# Patient Record
Sex: Female | Born: 1970
Health system: Southern US, Community
[De-identification: ages and names within clinical notes are randomized; demographics above are authoritative.]

## PROBLEM LIST (undated history)

## (undated) ENCOUNTER — Emergency Department (HOSPITAL_COMMUNITY): Admission: EM | Payer: 59

## (undated) DIAGNOSIS — D649 Anemia, unspecified: Secondary | ICD-10-CM

## (undated) DIAGNOSIS — R112 Nausea with vomiting, unspecified: Secondary | ICD-10-CM

## (undated) DIAGNOSIS — R7303 Prediabetes: Secondary | ICD-10-CM

## (undated) DIAGNOSIS — R51 Headache: Secondary | ICD-10-CM

## (undated) DIAGNOSIS — E739 Lactose intolerance, unspecified: Secondary | ICD-10-CM

## (undated) DIAGNOSIS — T884XXA Failed or difficult intubation, initial encounter: Secondary | ICD-10-CM

## (undated) DIAGNOSIS — Z9889 Other specified postprocedural states: Secondary | ICD-10-CM

## (undated) DIAGNOSIS — I10 Essential (primary) hypertension: Secondary | ICD-10-CM

## (undated) HISTORY — DX: Lactose intolerance, unspecified: E73.9

## (undated) HISTORY — DX: Prediabetes: R73.03

## (undated) HISTORY — DX: Failed or difficult intubation, initial encounter: T88.4XXA

## (undated) HISTORY — DX: Essential (primary) hypertension: I10

---

## 1998-11-19 HISTORY — PX: DILATION AND CURETTAGE OF UTERUS: SHX78

## 2009-09-28 ENCOUNTER — Emergency Department (HOSPITAL_COMMUNITY): Admission: EM | Admit: 2009-09-28 | Discharge: 2009-09-28 | Payer: Self-pay | Admitting: Family Medicine

## 2011-01-25 ENCOUNTER — Other Ambulatory Visit: Payer: Self-pay | Admitting: Internal Medicine

## 2011-01-25 DIAGNOSIS — R1900 Intra-abdominal and pelvic swelling, mass and lump, unspecified site: Secondary | ICD-10-CM

## 2011-02-02 ENCOUNTER — Ambulatory Visit
Admission: RE | Admit: 2011-02-02 | Discharge: 2011-02-02 | Disposition: A | Discharge status: Home / Self Care | Payer: 59 | Source: Ambulatory Visit | Attending: Internal Medicine | Admitting: Internal Medicine

## 2011-02-02 DIAGNOSIS — R1900 Intra-abdominal and pelvic swelling, mass and lump, unspecified site: Secondary | ICD-10-CM

## 2011-02-02 MED ORDER — IOHEXOL 300 MG/ML  SOLN
125.0000 mL | Freq: Once | INTRAMUSCULAR | Status: AC | PRN
  Administered 2011-02-02: 125 mL via INTRAVENOUS

## 2011-04-30 ENCOUNTER — Encounter (INDEPENDENT_AMBULATORY_CARE_PROVIDER_SITE_OTHER): Payer: Self-pay | Admitting: Surgery

## 2011-05-01 ENCOUNTER — Encounter (INDEPENDENT_AMBULATORY_CARE_PROVIDER_SITE_OTHER): Payer: Self-pay | Admitting: Surgery

## 2011-05-21 ENCOUNTER — Other Ambulatory Visit: Payer: Self-pay | Admitting: Occupational Medicine

## 2011-05-21 ENCOUNTER — Ambulatory Visit: Payer: Self-pay

## 2011-05-21 DIAGNOSIS — R52 Pain, unspecified: Secondary | ICD-10-CM

## 2012-09-16 ENCOUNTER — Emergency Department (INDEPENDENT_AMBULATORY_CARE_PROVIDER_SITE_OTHER)
Admission: EM | Admit: 2012-09-16 | Discharge: 2012-09-16 | Disposition: A | Discharge status: Home / Self Care | Payer: 59 | Source: Home / Self Care | Attending: Family Medicine | Admitting: Family Medicine

## 2012-09-16 ENCOUNTER — Encounter (HOSPITAL_COMMUNITY): Payer: Self-pay | Admitting: *Deleted

## 2012-09-16 DIAGNOSIS — N946 Dysmenorrhea, unspecified: Secondary | ICD-10-CM

## 2012-09-16 LAB — POCT URINALYSIS DIP (DEVICE)
Bilirubin Urine: NEGATIVE
Ketones, ur: NEGATIVE mg/dL
Leukocytes, UA: NEGATIVE
pH: 6.5 (ref 5.0–8.0)

## 2012-09-16 MED ORDER — KETOROLAC TROMETHAMINE 60 MG/2ML IM SOLN
INTRAMUSCULAR | Status: AC
  Filled 2012-09-16: qty 2

## 2012-09-16 MED ORDER — KETOROLAC TROMETHAMINE 10 MG PO TABS: 10.0000 mg | ORAL_TABLET | Freq: Four times a day (QID) | ORAL | Status: DC | PRN

## 2012-09-16 MED ORDER — KETOROLAC TROMETHAMINE 30 MG/ML IJ SOLN
60.0000 mg | Freq: Once | INTRAMUSCULAR | Status: AC
  Administered 2012-09-16: 60 mg via INTRAMUSCULAR

## 2012-09-16 NOTE — ED Provider Notes (Signed)
History     CSN: [id number]  Arrival date & time 09/16/12  0920   First MD Initiated Contact with Patient 09/16/12 [phone]      Chief Complaint  Patient presents with  . Abdominal Pain    (Consider location/radiation/quality/duration/timing/severity/associated sxs/prior treatment) Patient is a 41 y.o. female presenting with cramps. The history is provided by the patient.  Abdominal Cramping The primary symptoms of the illness include vaginal bleeding. The primary symptoms of the illness do not include fever, nausea, vomiting or vaginal discharge. The current episode started yesterday (assoc with onset of menses on sun, has h/o similar problem with menses in past.). The onset of the illness was sudden. The problem has not changed since onset. The patient states that she believes she is currently not pregnant. The patient has not had a change in bowel habit. Additional symptoms associated with the illness include chills and back pain. Symptoms associated with the illness do not include constipation, urgency, hematuria or frequency.    History reviewed. No pertinent past medical history.  History reviewed. No pertinent past surgical history.  No family history on file.  History  Substance Use Topics  . Smoking status: Never Smoker   . Smokeless tobacco: Not on file  . Alcohol Use: No    OB History    Grav Para Term Preterm Abortions TAB SAB Ect Mult Living                  Review of Systems  Constitutional: Positive for chills. Negative for fever.  Gastrointestinal: Negative.  Negative for nausea, vomiting and constipation.  Genitourinary: Positive for vaginal bleeding, menstrual problem and pelvic pain. Negative for urgency, frequency, hematuria, flank pain, vaginal discharge and vaginal pain.  Musculoskeletal: Positive for back pain.    Allergies  Review of patient's allergies indicates no known allergies.  Home Medications   Current Outpatient Rx  Name Route Sig  Dispense Refill  . KETOROLAC TROMETHAMINE 10 MG PO TABS Oral Take 1 tablet (10 mg total) by mouth every 6 (six) hours as needed for pain. 20 tablet 0    BP 152/82  Pulse 84  Temp 99.4 F (37.4 C) (Oral)  Resp 20  SpO2 100%  LMP 09/15/2012  Physical Exam  Nursing note and vitals reviewed. Constitutional: She is oriented to person, place, and time. She appears well-developed and well-nourished.  HENT:  Head: Normocephalic.  Eyes: Pupils are equal, round, and reactive to light.  Neck: Normal range of motion. Neck supple.  Pulmonary/Chest: Breath sounds normal.  Abdominal: Soft. Bowel sounds are normal. She exhibits no distension and no mass. There is tenderness in the suprapubic area. There is no rebound and no guarding.    Neurological: She is alert and oriented to person, place, and time.  Skin: Skin is warm and dry.    ED Course  Procedures (including critical care time)  Labs Reviewed  POCT URINALYSIS DIP (DEVICE) - Abnormal; Notable for the following:    Hgb urine dipstick LARGE (*)     Protein, ur 30 (*)     All other components within normal limits  POCT PREGNANCY, URINE   No results found.   1. Dysmenorrhea       MDM         Linna Hoff, MD 09/16/12 1037

## 2012-09-16 NOTE — ED Notes (Signed)
Pt  Reports symptoms  Of  Low  abd  Pain   Cramping  In nature  - intermittant   denys  Any  Vag  Bleeding   Or  Discharge  -  Pt  Reports        The  Pain  Is  Sometimes    Associated  With  Her  Periods          Pt  Reports  The  Pain  Started  Yesterday

## 2013-01-21 ENCOUNTER — Encounter (HOSPITAL_COMMUNITY): Payer: Self-pay | Admitting: Pharmacist

## 2013-01-26 NOTE — H&P (Signed)
[facility]  DICTATION # [phone] CSN# [id number]   Meriel Pica, MD 01/26/2013 3:06 PM

## 2013-01-30 ENCOUNTER — Encounter (HOSPITAL_COMMUNITY)
Admission: RE | Admit: 2013-01-30 | Discharge: 2013-01-30 | Disposition: A | Discharge status: Home / Self Care | Payer: 59 | Source: Ambulatory Visit | Attending: Obstetrics and Gynecology | Admitting: Obstetrics and Gynecology

## 2013-01-30 ENCOUNTER — Encounter (HOSPITAL_COMMUNITY): Payer: Self-pay

## 2013-01-30 HISTORY — DX: Anemia, unspecified: D64.9

## 2013-01-30 HISTORY — DX: Other specified postprocedural states: R11.2

## 2013-01-30 HISTORY — DX: Nausea with vomiting, unspecified: Z98.890

## 2013-01-30 HISTORY — DX: Headache: R51

## 2013-01-30 LAB — SURGICAL PCR SCREEN: Staphylococcus aureus: NEGATIVE

## 2013-01-30 LAB — CBC
HCT: 29.1 % — ABNORMAL LOW (ref 36.0–46.0)
Platelets: 390 10*3/uL (ref 150–400)
RDW: 16.9 % — ABNORMAL HIGH (ref 11.5–15.5)
WBC: 6.1 10*3/uL (ref 4.0–10.5)

## 2013-01-30 NOTE — Patient Instructions (Addendum)
   Your procedure is scheduled ZO:[id number] March 20th  Enter through the Main Entrance of [facility] at:6am Pick up the phone at the desk and dial [phone] and inform us of your arrival.  Please call this number if you have any problems the morning of surgery: [phone]  Remember: Do not eat or drink anything after midnight on Wednesday   Do not wear jewelry, make-up, or FINGER nail polish No metal in your hair or on your body. Do not wear lotions, powders, perfumes. You may wear deodorant.  Please use your CHG wash as directed prior to surgery.  Do not shave anywhere for at least 12 hours prior to first CHG shower.  Do not bring valuables to the hospital. Contacts, dentures or bridgework may not be worn into surgery.   Patients discharged on the day of surgery will not be allowed to drive home.

## 2013-02-05 ENCOUNTER — Ambulatory Visit (HOSPITAL_COMMUNITY): Payer: 59 | Admitting: Anesthesiology

## 2013-02-05 ENCOUNTER — Encounter (HOSPITAL_COMMUNITY)
Admission: RE | Disposition: A | Discharge status: Home / Self Care | Payer: Self-pay | Source: Ambulatory Visit | Attending: Obstetrics and Gynecology

## 2013-02-05 ENCOUNTER — Ambulatory Visit (HOSPITAL_COMMUNITY)
Admission: RE | Admit: 2013-02-05 | Discharge: 2013-02-05 | Disposition: A | Discharge status: Home / Self Care | Payer: 59 | Source: Ambulatory Visit | Attending: Obstetrics and Gynecology | Admitting: Obstetrics and Gynecology

## 2013-02-05 ENCOUNTER — Encounter (HOSPITAL_COMMUNITY): Payer: Self-pay | Admitting: Anesthesiology

## 2013-02-05 DIAGNOSIS — N946 Dysmenorrhea, unspecified: Secondary | ICD-10-CM | POA: Insufficient documentation

## 2013-02-05 DIAGNOSIS — N949 Unspecified condition associated with female genital organs and menstrual cycle: Secondary | ICD-10-CM | POA: Insufficient documentation

## 2013-02-05 DIAGNOSIS — N938 Other specified abnormal uterine and vaginal bleeding: Secondary | ICD-10-CM | POA: Insufficient documentation

## 2013-02-05 DIAGNOSIS — D649 Anemia, unspecified: Secondary | ICD-10-CM | POA: Insufficient documentation

## 2013-02-05 HISTORY — PX: LAPAROSCOPIC TUBAL LIGATION: SHX1937

## 2013-02-05 HISTORY — PX: DILITATION & CURRETTAGE/HYSTROSCOPY WITH NOVASURE ABLATION: SHX5568

## 2013-02-05 LAB — TYPE AND SCREEN
ABO/RH(D): O POS
Antibody Screen: NEGATIVE

## 2013-02-05 LAB — PREGNANCY, URINE: Preg Test, Ur: NEGATIVE

## 2013-02-05 SURGERY — DILATATION & CURETTAGE/HYSTEROSCOPY WITH NOVASURE ABLATION
Anesthesia: General | Site: Uterus | Wound class: Clean Contaminated

## 2013-02-05 MED ORDER — ONDANSETRON HCL 4 MG/2ML IJ SOLN
INTRAMUSCULAR | Status: AC
  Filled 2013-02-05: qty 2

## 2013-02-05 MED ORDER — BUPIVACAINE HCL (PF) 0.25 % IJ SOLN
INTRAMUSCULAR | Status: AC
  Filled 2013-02-05: qty 30

## 2013-02-05 MED ORDER — SUCCINYLCHOLINE CHLORIDE 20 MG/ML IJ SOLN
INTRAMUSCULAR | Status: DC | PRN
  Administered 2013-02-05: 120 mg via INTRAVENOUS

## 2013-02-05 MED ORDER — CEFAZOLIN SODIUM-DEXTROSE 2-3 GM-% IV SOLR
2.0000 g | INTRAVENOUS | Status: AC
  Administered 2013-02-05: 2 g via INTRAVENOUS
  Filled 2013-02-05: qty 50

## 2013-02-05 MED ORDER — FENTANYL CITRATE 0.05 MG/ML IJ SOLN
INTRAMUSCULAR | Status: DC | PRN
  Administered 2013-02-05: 50 ug via INTRAVENOUS
  Administered 2013-02-05: 100 ug via INTRAVENOUS
  Administered 2013-02-05 (×2): 50 ug via INTRAVENOUS

## 2013-02-05 MED ORDER — PROPOFOL 10 MG/ML IV EMUL
INTRAVENOUS | Status: AC
  Filled 2013-02-05: qty 20

## 2013-02-05 MED ORDER — MIDAZOLAM HCL 2 MG/2ML IJ SOLN
INTRAMUSCULAR | Status: AC
  Filled 2013-02-05: qty 2

## 2013-02-05 MED ORDER — ROCURONIUM BROMIDE 50 MG/5ML IV SOLN
INTRAVENOUS | Status: AC
  Filled 2013-02-05: qty 1

## 2013-02-05 MED ORDER — LIDOCAINE HCL (CARDIAC) 20 MG/ML IV SOLN
INTRAVENOUS | Status: AC
  Filled 2013-02-05: qty 5

## 2013-02-05 MED ORDER — MIDAZOLAM HCL 5 MG/5ML IJ SOLN
INTRAMUSCULAR | Status: DC | PRN
  Administered 2013-02-05: 2 mg via INTRAVENOUS

## 2013-02-05 MED ORDER — DEXAMETHASONE SODIUM PHOSPHATE 10 MG/ML IJ SOLN
INTRAMUSCULAR | Status: AC
  Filled 2013-02-05: qty 1

## 2013-02-05 MED ORDER — KETOROLAC TROMETHAMINE 30 MG/ML IJ SOLN: 15.0000 mg | Freq: Once | INTRAMUSCULAR | Status: DC | PRN

## 2013-02-05 MED ORDER — SCOPOLAMINE 1 MG/3DAYS TD PT72: 1.0000 | MEDICATED_PATCH | Freq: Once | TRANSDERMAL | Status: DC

## 2013-02-05 MED ORDER — SODIUM CHLORIDE 0.9 % IJ SOLN
INTRAMUSCULAR | Status: DC | PRN
  Administered 2013-02-05: 10 mL

## 2013-02-05 MED ORDER — ROCURONIUM BROMIDE 100 MG/10ML IV SOLN
INTRAVENOUS | Status: DC | PRN
  Administered 2013-02-05: 30 mg via INTRAVENOUS
  Administered 2013-02-05: 5 mg via INTRAVENOUS

## 2013-02-05 MED ORDER — FENTANYL CITRATE 0.05 MG/ML IJ SOLN
INTRAMUSCULAR | Status: AC
  Filled 2013-02-05: qty 2

## 2013-02-05 MED ORDER — ONDANSETRON HCL 4 MG/2ML IJ SOLN: 4.0000 mg | Freq: Once | INTRAMUSCULAR | Status: DC | PRN

## 2013-02-05 MED ORDER — LIDOCAINE HCL (CARDIAC) 20 MG/ML IV SOLN
INTRAVENOUS | Status: DC | PRN
  Administered 2013-02-05: 50 mg via INTRAVENOUS

## 2013-02-05 MED ORDER — SCOPOLAMINE 1 MG/3DAYS TD PT72
MEDICATED_PATCH | TRANSDERMAL | Status: AC
  Administered 2013-02-05: 1.5 mg via TRANSDERMAL
  Filled 2013-02-05: qty 1

## 2013-02-05 MED ORDER — HYDROCODONE-IBUPROFEN 7.5-200 MG PO TABS: 1.0000 | ORAL_TABLET | Freq: Three times a day (TID) | ORAL | Status: DC | PRN

## 2013-02-05 MED ORDER — KETOROLAC TROMETHAMINE 30 MG/ML IJ SOLN
INTRAMUSCULAR | Status: DC | PRN
  Administered 2013-02-05: 30 mg via INTRAVENOUS

## 2013-02-05 MED ORDER — FENTANYL CITRATE 0.05 MG/ML IJ SOLN
INTRAMUSCULAR | Status: AC
  Filled 2013-02-05: qty 5

## 2013-02-05 MED ORDER — LIDOCAINE HCL 1 % IJ SOLN
INTRAMUSCULAR | Status: DC | PRN
  Administered 2013-02-05: 20 mL

## 2013-02-05 MED ORDER — BUPIVACAINE HCL (PF) 0.25 % IJ SOLN
INTRAMUSCULAR | Status: DC | PRN
  Administered 2013-02-05: 4 mL

## 2013-02-05 MED ORDER — MEPERIDINE HCL 25 MG/ML IJ SOLN: 6.2500 mg | INTRAMUSCULAR | Status: DC | PRN

## 2013-02-05 MED ORDER — NEOSTIGMINE METHYLSULFATE 1 MG/ML IJ SOLN
INTRAMUSCULAR | Status: AC
  Filled 2013-02-05: qty 1

## 2013-02-05 MED ORDER — SUCCINYLCHOLINE CHLORIDE 20 MG/ML IJ SOLN
INTRAMUSCULAR | Status: AC
  Filled 2013-02-05: qty 10

## 2013-02-05 MED ORDER — LACTATED RINGERS IV SOLN
INTRAVENOUS | Status: DC
  Administered 2013-02-05: 07:00:00 via INTRAVENOUS

## 2013-02-05 MED ORDER — KETOROLAC TROMETHAMINE 30 MG/ML IJ SOLN
INTRAMUSCULAR | Status: AC
  Filled 2013-02-05: qty 1

## 2013-02-05 MED ORDER — FENTANYL CITRATE 0.05 MG/ML IJ SOLN
25.0000 ug | INTRAMUSCULAR | Status: DC | PRN
  Administered 2013-02-05: 50 ug via INTRAVENOUS
  Administered 2013-02-05: 25 ug via INTRAVENOUS

## 2013-02-05 MED ORDER — ONDANSETRON HCL 4 MG/2ML IJ SOLN
INTRAMUSCULAR | Status: DC | PRN
  Administered 2013-02-05: 4 mg via INTRAVENOUS

## 2013-02-05 MED ORDER — LACTATED RINGERS IR SOLN
Status: DC | PRN
  Administered 2013-02-05: 3000 mL

## 2013-02-05 MED ORDER — GLYCOPYRROLATE 0.2 MG/ML IJ SOLN
INTRAMUSCULAR | Status: AC
  Filled 2013-02-05: qty 4

## 2013-02-05 MED ORDER — PROPOFOL 10 MG/ML IV EMUL
INTRAVENOUS | Status: DC | PRN
  Administered 2013-02-05: 200 mg via INTRAVENOUS
  Administered 2013-02-05: 100 mg via INTRAVENOUS

## 2013-02-05 MED ORDER — DEXAMETHASONE SODIUM PHOSPHATE 4 MG/ML IJ SOLN
INTRAMUSCULAR | Status: DC | PRN
  Administered 2013-02-05: 10 mg via INTRAVENOUS

## 2013-02-05 SURGICAL SUPPLY — 25 items
ABLATOR ENDOMETRIAL BIPOLAR (ABLATOR) ×3 IMPLANT
CATH ROBINSON RED A/P 16FR (CATHETERS) ×3 IMPLANT
CLIP FILSHIE TUBAL LIGA STRL (Clip) ×6 IMPLANT
CLOTH BEACON ORANGE TIMEOUT ST (SAFETY) ×3 IMPLANT
CONTAINER PREFILL 10% NBF 60ML (FORM) ×6 IMPLANT
DERMABOND ADVANCED (GAUZE/BANDAGES/DRESSINGS) ×1
DERMABOND ADVANCED .7 DNX12 (GAUZE/BANDAGES/DRESSINGS) ×2 IMPLANT
DRESSING TELFA 8X3 (GAUZE/BANDAGES/DRESSINGS) ×3 IMPLANT
DRSG COVADERM PLUS 2X2 (GAUZE/BANDAGES/DRESSINGS) ×3 IMPLANT
GLOVE BIO SURGEON STRL SZ7 (GLOVE) ×3 IMPLANT
GOWN PREVENTION PLUS LG XLONG (DISPOSABLE) ×6 IMPLANT
GOWN STRL REIN XL XLG (GOWN DISPOSABLE) ×9 IMPLANT
NEEDLE INSUFFLATION 120MM (ENDOMECHANICALS) ×3 IMPLANT
NEEDLE INSUFFLATION 14GA 150MM (NEEDLE) ×3 IMPLANT
PACK HYSTEROSCOPY LF (CUSTOM PROCEDURE TRAY) ×3 IMPLANT
PACK LAPAROSCOPY BASIN (CUSTOM PROCEDURE TRAY) ×3 IMPLANT
PAD OB MATERNITY 4.3X12.25 (PERSONAL CARE ITEMS) ×3 IMPLANT
STRIP CLOSURE SKIN 1/4X3 (GAUZE/BANDAGES/DRESSINGS) ×3 IMPLANT
SUT VIC AB 2-0 UR6 27 (SUTURE) IMPLANT
SUT VICRYL RAPIDE 4/0 PS 2 (SUTURE) ×3 IMPLANT
SYR 20CC LL (SYRINGE) ×3 IMPLANT
TOWEL OR 17X24 6PK STRL BLUE (TOWEL DISPOSABLE) ×6 IMPLANT
TROCAR XCEL DIL TIP R 11M (ENDOMECHANICALS) ×3 IMPLANT
WARMER LAPAROSCOPE (MISCELLANEOUS) ×3 IMPLANT
WATER STERILE IRR 1000ML POUR (IV SOLUTION) ×3 IMPLANT

## 2013-02-05 NOTE — Anesthesia Preprocedure Evaluation (Signed)
Anesthesia Evaluation  Patient identified by MRN, date of birth, ID band Patient awake    Reviewed: Allergy & Precautions, H&P , NPO status , Patient's Chart, lab work & pertinent test results  History of Anesthesia Complications (+) PONV  Airway Mallampati: III TM Distance: >3 FB Neck ROM: full    Dental no notable dental hx. (+) Teeth Intact   Pulmonary neg pulmonary ROS,    Pulmonary exam normal       Cardiovascular negative cardio ROS      Neuro/Psych negative psych ROS   GI/Hepatic negative GI ROS, Neg liver ROS,   Endo/Other  Morbid obesity  Renal/GU negative Renal ROS  negative genitourinary   Musculoskeletal negative musculoskeletal ROS (+)   Abdominal (+) + obese,   Peds negative pediatric ROS (+)  Hematology negative hematology ROS (+)   Anesthesia Other Findings   Reproductive/Obstetrics negative OB ROS                           Anesthesia Physical Anesthesia Plan  ASA: III  Anesthesia Plan: General   Post-op Pain Management:    Induction: Intravenous  Airway Management Planned: Oral ETT  Additional Equipment:   Intra-op Plan:   Post-operative Plan: Extubation in OR  Informed Consent: I have reviewed the patients History and Physical, chart, labs and discussed the procedure including the risks, benefits and alternatives for the proposed anesthesia with the patient or authorized representative who has indicated his/her understanding and acceptance.   Dental Advisory Given  Plan Discussed with: CRNA and Surgeon  Anesthesia Plan Comments:         Anesthesia Quick Evaluation

## 2013-02-05 NOTE — Procedures (Signed)
NAMEJOLEAH, KOSAK NO.:  [id number]  MEDICAL RECORD NO.:  [id number]          PATIENT TYPE:  OUT  LOCATION:  SLEEP LAB                     FACILITY:  [facility]  PHYSICIAN:  Duke Salvia. Marcelle Overlie, M.D.DATE OF BIRTH:  11-20-1970  DATE OF STUDY:                           NOCTURNAL POLYSOMNOGRAM  REFERRING PHYSICIAN:  CHIEF COMPLAINT:  Menorrhagia with anemia, requests permanent sterilization.  HISTORY OF PRESENT ILLNESS:  A 42 year old, G2, P2, using condoms seen initially 1/14 complaining of menorrhagia, history of 2 vaginal deliveries in the past.  Our office fingerstick hemoglobin was 4.5, with UPT negative.  She was started on OCPs b.i.d. at that time, the controller bleeding along with iron and sonohysterogram was ordered. Her CBC showed a WBC 6.7, hemoglobin 5.4, platelets 344,000.  Beta-HCG in our office showed thickened endometrium, 1 cm on each side, on saline infusion, was noted to be approximately 10 mm endometrium on 1 side and then there was also a small possible polyp in the fundus.  We discussed a number of treatment options with her.  In addition to being on her iron and OCPs to regulate her bleeding including Mirena IUD, D and C, hysteroscopy, ablation, or hysterectomy.  The need for minimally doing endometrial sampling discussed with her.  She presents now preferring lap tubal with D and C, and NovaSure endometrial ablation. The permanence of this procedure, failure rated 2 to 01/999 discussed. The ablation procedure including the 90% hypermenorrhea rate.  Other risks related to bleeding, infection, complications, may require additional surgery such as uterine perforation.  Reviewed with her.  INDICATION FOR STUDY:  EPWORTH SLEEPINESS SCORE:  MEDICATIONS:  OCPs for bleeding regulation, Ferrlecit 90, ketorolac 10 mg 1 p.o. q.6 hours p.r.n. cramps along with multivitamins.  REVIEW OF SYSTEMS:  Otherwise, negative.  FAMILY HISTORY:  Otherwise,  unremarkable.  She has had 2 vaginal deliveries in 1998 and 2001.  SOCIAL HISTORY:  Denies drug or tobacco use.  Occasional alcohol use. She is married.  PHYSICAL EXAMINATION:  VITAL SIGNS: Temperature 98.2, BP 116/60.  Her weight is 248. HEENT:  Unremarkable. NECK:  Supple without masses.  LUNGS:  Clear. CARDIOVASCULAR:  Regular rate and rhythm without murmurs, rubs, gallops. BREASTS:  Without masses. ABDOMEN:  Soft, flat, and nontender.  PELVIC:  Vulva, vagina, cervix were normal.  Uterus is mid position, normal size.  Adnexa negative. Last Pap was in June 2013. EXTREMITIES: Unremarkable. NEUROLOGIC:  Unremarkable.  IMPRESSION:  Menorrhagia with dysmenorrhea and anemia.  PLAN:  D and C, hysteroscopy with NovaSure endometrial ablation, laparoscopic tubal with Filshie clip application.  Procedure and risks discussed as above.  SLEEP ARCHITECTURE:  RESPIRATORY DATA:  OXYGEN DATA:  CARDIAC DATA:  MOVEMENT-PARASOMNIA:  IMPRESSIONS-RECOMMENDATIONS:     Duke Salvia. Marcelle Overlie, M.D.    [facility]/MEDQ  D:  01/26/2013 15:01:25  T:  01/27/2013 04:01:07  Job:  [id number]

## 2013-02-05 NOTE — Progress Notes (Signed)
The patient was re-examined with no change in status 

## 2013-02-05 NOTE — Anesthesia Postprocedure Evaluation (Signed)
  Anesthesia Post-op Note  Anesthesia Post Note  Patient: [name]  Procedure(s) Performed: Procedure(s) (LRB): DILATATION & CURETTAGE/HYSTEROSCOPY WITH NOVASURE ABLATION (N/A) LAPAROSCOPIC TUBAL LIGATION (Bilateral)  Anesthesia type: General  Patient location: PACU  Post pain: Pain level controlled  Post assessment: Post-op Vital signs reviewed  Last Vitals:  Filed Vitals:   02/05/13 0930  BP: 138/78  Pulse: 59  Temp:   Resp: 14    Post vital signs: Reviewed  Level of consciousness: sedated  Complications: No apparent anesthesia complications

## 2013-02-05 NOTE — Op Note (Signed)
preoperative diagnosis: Request permanent sterilization, abnormal uterine bleeding with anemia  Postoperative diagnosis: Same  Procedure: Attempted laparoscopic tubal ligation, D&C hysteroscopy with NovaSure endometrial ablation  Surgeon: Marcelle Overlie  Anesthesia: General  EBL: Less than 10 cc  Specimens removed: Endometrial curettings, to pathology  Procedure and findings:  The patient taken the operating room after an adequate level of general anesthesia was obtained with the patient's legs in stirrups the abdomen perineum and vagina were prepped and draped in usual fashion for laparoscopy/D&C. The bladder was drained. Appropriate timeout for taken at that point. Hulka tenaculum was positioned on the uterus.  Anesthesia had some difficulty with intubation, therefore NG tube was passed to insufflate the stomach she had a large panniculus, even with the longer varies needle could never achieve normal intra-abdominal type pressure readings to be able to insufflate properly. Decision made to abandon further attempts at laparoscopy. The 4-0 Vicryl suture used to close the incision in subcuticular fashion. The vaginal portion the procedure was started  The legs were extended weighted speculum was positioned cervix grasped with tenaculum uterus sounded to 12 cm with a cervical length of 4.5. The cervix was progressively dilated to 27-32 dilator hysteroscopy was carried out revealing the endometrium to be fairly thin I did not see any polyps or abnormal tissue buildup. Sharp curettage was carried out minimal tissue removed sent as specimen to pathology.  After the appropriate measurements were entered into the NovaSure device it was placed into the uterus per protocol passing the CO2 testing and completing the treatment cycle. She received Toradol  At the end of the procedure went to recovery room in good condition.  Dictated with [facility]  Marykathleen Russi M. Milana Obey.D.

## 2013-02-05 NOTE — Transfer of Care (Signed)
Immediate Anesthesia Transfer of Care Note  Patient: [name]  Procedure(s) Performed: Procedure(s) with comments: DILATATION & CURETTAGE/HYSTEROSCOPY WITH NOVASURE ABLATION (N/A) LAPAROSCOPIC TUBAL LIGATION (Bilateral) - attempted tubal ligation  Patient Location: PACU  Anesthesia Type:General  Level of Consciousness: awake, alert  and oriented  Airway & Oxygen Therapy: Patient Spontanous Breathing and Patient connected to nasal cannula oxygen  Post-op Assessment: Report given to PACU RN and Post -op Vital signs reviewed and stable  Post vital signs: Reviewed and stable  Complications: No apparent anesthesia complications

## 2013-02-05 NOTE — Anesthesia Procedure Notes (Signed)
Procedure Name: Intubation Date/Time: 02/05/2013 7:40 AM Performed by: Shanon Payor Pre-anesthesia Checklist: Suction available, Emergency Drugs available, Timeout performed, Patient identified and Patient being monitored Patient Re-evaluated:Patient Re-evaluated prior to inductionOxygen Delivery Method: Circle system utilized Preoxygenation: Pre-oxygenation with 100% oxygen Intubation Type: IV induction and Inhalational induction Ventilation: Oral airway inserted - appropriate to patient size and Two handed mask ventilation required Grade View: Grade III Tube type: Oral Tube size: 7.0 mm Number of attempts: 3 Airway Equipment and Method: Stylet and Video-laryngoscopy Placement Confirmation: ETT inserted through vocal cords under direct vision,  positive ETCO2 and breath sounds checked- equal and bilateral Secured at: 22 cm Dental Injury: Teeth and Oropharynx as per pre-operative assessment  Difficulty Due To: Difficulty was anticipated, Difficult Airway- due to large tongue and Difficult Airway- due to limited oral opening

## 2013-02-05 NOTE — H&P (Signed)
[name], [name] NO.:  [id number]  MEDICAL RECORD NO.:  [id number]  LOCATION:  [facility]                          FACILITY:  [facility]  PHYSICIAN:  Duke Salvia. Marcelle Overlie, M.D.DATE OF BIRTH:  May 22, 1971  DATE OF ADMISSION:  02/05/2013 DATE OF DISCHARGE:  02/05/2013                             HISTORY & PHYSICAL   CHIEF COMPLAINT:  Menorrhagia with anemia, requests permanent sterilization.  HISTORY OF PRESENT ILLNESS:  A 42 year old, G2, P2, using condoms, seen initially 1/14 complaining of menorrhagia.  History of 2 vaginal deliveries in the past.  Our office fingerstick hemoglobin was 4.5, with UPT negative.  She was started on OCPs b.i.d. at that time, the controller bleeding along with iron and sonohysterogram was ordered. Her CBC showed a WBC 6.7, hemoglobin 5.4, platelets 344,000.  Beta-HCG in our office showed thickened endometrium, 1 cm on each side, on saline infusion, was noted to be approximately 10 mm endometrium on 1 side and then there was also a small possible polyp in the fundus.  We discussed a number of treatment options with her.  In addition to being on her iron and OCPs to regulate her bleeding including Mirena IUD, D and C, hysteroscopy, ablation, or hysterectomy.  The need for minimally doing endometrial sampling discussed with her.  She presents now preferring lap tubal with D and C, and NovaSure endometrial ablation. The permanence of this procedure, failure rated 2 to 01/999 discussed. The ablation procedure including the 90% hypermenorrhea rate.  Other risks related to bleeding, infection, complications, may require additional surgery such as uterine perforation.  Reviewed with her.  CURRENT MEDICATIONS:  OCPs for bleeding regulation, Ferrlecit 90, ketorolac 10 mg 1 p.o. q.6 hours p.r.n. cramps along with multivitamins.  REVIEW OF SYSTEMS:  Otherwise, negative.  FAMILY HISTORY:  Otherwise, unremarkable.  She has had 2 vaginal deliveries in  1998 and 2001.  SOCIAL HISTORY:  Denies drug or tobacco use.  Occasional alcohol use. She is married.  PHYSICAL EXAMINATION:  VITAL SIGNS: Temperature 98.2, BP 116/60.  Her weight is 248. HEENT:  Unremarkable. NECK:  Supple without masses.  LUNGS:  Clear. CARDIOVASCULAR:  Regular rate and rhythm without murmurs, rubs, gallops. BREASTS:  Without masses. ABDOMEN:  Soft, flat, and nontender. PELVIC:  Vulva, vagina, cervix were normal.  Uterus is mid position, normal size.  Adnexa negative.  Last Pap was in June 2013. EXTREMITIES: Unremarkable. NEUROLOGIC:  Unremarkable.  IMPRESSION:  Menorrhagia with dysmenorrhea and anemia.  PLAN:  D and C, hysteroscopy with NovaSure endometrial ablation, laparoscopic tubal with Filshie clip application.  Procedure and risks discussed as above.     [name] M. Marcelle Overlie, M.D.     [facility]/MEDQ  D:  01/26/2013  T:  02/05/2013  Job:  [id number]

## 2013-02-06 ENCOUNTER — Encounter (HOSPITAL_COMMUNITY): Payer: Self-pay | Admitting: Obstetrics and Gynecology

## 2014-03-24 ENCOUNTER — Encounter (HOSPITAL_COMMUNITY): Payer: Self-pay | Admitting: Obstetrics and Gynecology

## 2014-06-23 ENCOUNTER — Ambulatory Visit (INDEPENDENT_AMBULATORY_CARE_PROVIDER_SITE_OTHER): Payer: Commercial Managed Care - PPO | Admitting: Family Medicine

## 2014-06-23 VITALS — BP 128/72 | HR 83 | Temp 98.1°F | Resp 16 | Ht 63.5 in | Wt 265.0 lb

## 2014-06-23 DIAGNOSIS — Z0000 Encounter for general adult medical examination without abnormal findings: Secondary | ICD-10-CM

## 2014-06-23 DIAGNOSIS — Z862 Personal history of diseases of the blood and blood-forming organs and certain disorders involving the immune mechanism: Secondary | ICD-10-CM

## 2014-06-23 LAB — POCT CBC
Granulocyte percent: 49.6 %G (ref 37–80)
HCT, POC: 40.5 % (ref 37.7–47.9)
Hemoglobin: 13 g/dL (ref 12.2–16.2)
Lymph, poc: 2.2 (ref 0.6–3.4)
MCH: 24.9 pg — AB (ref 27–31.2)
MCHC: 32.2 g/dL (ref 31.8–35.4)
MCV: 77.5 fL — AB (ref 80–97)
MID (CBC): 0.3 (ref 0–0.9)
MPV: 7.1 fL (ref 0–99.8)
PLATELET COUNT, POC: 291 10*3/uL (ref 142–424)
POC Granulocyte: 2.4 (ref 2–6.9)
POC LYMPH %: 44 % (ref 10–50)
POC MID %: 6.4 %M (ref 0–12)
RBC: 5.22 M/uL (ref 4.04–5.48)
RDW, POC: 15.4 %
WBC: 4.9 10*3/uL (ref 4.6–10.2)

## 2014-06-23 LAB — POCT GLYCOSYLATED HEMOGLOBIN (HGB A1C): Hemoglobin A1C: 5.6

## 2014-06-23 NOTE — Progress Notes (Signed)
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 She just felt like it is time to get one. She gets her Pap and breast exam done elsewhere, and is scheduled this fall. She has no major acute medical complaints.  Past medical history: Surgeries: Only one minor surgery Medical illnesses: None except has a history of anemia Gravida 2. Para 2 Allergies: None Regular medications: None except daily OTC iron  Social history: Patient is from [place], has been in the [place] about 8 years. She has been healthy. She is married and has 2 children. She works at [employer]. She does not get any regular exercise.  Review of systems: Constitutional: Unremarkable HEENT: Unremarkable Respiratory: Unremarkable Gastrointestinal: Unremarkable Cardiovascular : Unremarkable Genitourinary: Unremarkable Musculoskeletal: Unremarkable Neurologic: Normal Dermatologic: Unremarkable Psychiatric: Unremarkable Endocrinologic: Unremarkable   Physical exam: Obese lady in no acute distress. TMs normal. Eyes PERRLA. Fundi benign. Throat clear. Neck supple without nodes or. Chest clear to auscultation. Heart regular without murmurs gallops or arrhythmias. Abdomen soft without mass or tenderness. Breasts and pelvic exam not done today. Extremities unremarkable without edema. Good pedal pulses. Skin normal.  Assessment: Physical examination Obesity, morbid History of anemia  Plan: Check labs including level additional labs. She wants to make sure she is not diabetic.  I will let her know the results for labs

## 2014-06-23 NOTE — Patient Instructions (Signed)
Get regular exercise  Decreased oral intake of food.  Try to get her weight coming down  Let you know the results of your other labs  Make sure you can get the Pap smear and breast exam as already scheduled

## 2014-06-24 LAB — COMPREHENSIVE METABOLIC PANEL
ALK PHOS: 63 U/L (ref 39–117)
ALT: 14 U/L (ref 0–35)
AST: 15 U/L (ref 0–37)
Albumin: 4.4 g/dL (ref 3.5–5.2)
BILIRUBIN TOTAL: 0.4 mg/dL (ref 0.2–1.2)
BUN: 9 mg/dL (ref 6–23)
CO2: 30 mEq/L (ref 19–32)
Calcium: 9.4 mg/dL (ref 8.4–10.5)
Chloride: 101 mEq/L (ref 96–112)
Creat: 0.65 mg/dL (ref 0.50–1.10)
GLUCOSE: 85 mg/dL (ref 70–99)
Potassium: 4.1 mEq/L (ref 3.5–5.3)
Sodium: 137 mEq/L (ref 135–145)
Total Protein: 7.2 g/dL (ref 6.0–8.3)

## 2014-06-24 LAB — FERRITIN: Ferritin: 23 ng/mL (ref 10–291)

## 2014-06-24 LAB — LIPID PANEL
Cholesterol: 141 mg/dL (ref 0–200)
HDL: 38 mg/dL — AB (ref >39)
LDL CALC: 84 mg/dL (ref 0–99)
TRIGLYCERIDES: 96 mg/dL (ref <150)
Total CHOL/HDL Ratio: 3.7 Ratio
VLDL: 19 mg/dL (ref 0–40)

## 2014-06-24 LAB — TSH: TSH: 1.562 u[IU]/mL (ref 0.350–4.500)

## 2014-06-26 ENCOUNTER — Encounter: Payer: Self-pay | Admitting: *Deleted

## 2014-08-30 ENCOUNTER — Other Ambulatory Visit: Payer: Self-pay | Admitting: Obstetrics and Gynecology

## 2014-08-31 LAB — CYTOLOGY - PAP

## 2015-10-19 ENCOUNTER — Ambulatory Visit (INDEPENDENT_AMBULATORY_CARE_PROVIDER_SITE_OTHER): Payer: 59 | Admitting: Family Medicine

## 2015-10-19 VITALS — BP 120/72 | HR 85 | Temp 98.9°F | Resp 18 | Ht 63.0 in | Wt 268.0 lb

## 2015-10-19 DIAGNOSIS — D508 Other iron deficiency anemias: Secondary | ICD-10-CM | POA: Diagnosis not present

## 2015-10-19 DIAGNOSIS — Z0000 Encounter for general adult medical examination without abnormal findings: Secondary | ICD-10-CM

## 2015-10-19 LAB — POCT CBC
Granulocyte percent: 52.8 %G (ref 37–80)
HCT, POC: 31 % — AB (ref 37.7–47.9)
Hemoglobin: 10.2 g/dL — AB (ref 12.2–16.2)
Lymph, poc: 2 (ref 0.6–3.4)
MCH, POC: 21.2 pg — AB (ref 27–31.2)
MCHC: 32.8 g/dL (ref 31.8–35.4)
MCV: 64.7 fL — AB (ref 80–97)
MID (cbc): 0.5 (ref 0–0.9)
MPV: 6.5 fL (ref 0–99.8)
POC Granulocyte: 2.9 (ref 2–6.9)
POC LYMPH PERCENT: 37.3 %L (ref 10–50)
POC MID %: 9.9 %M (ref 0–12)
Platelet Count, POC: 326 10*3/uL (ref 142–424)
RBC: 4.79 M/uL (ref 4.04–5.48)
RDW, POC: 18.5 %
WBC: 5.4 10*3/uL (ref 4.6–10.2)

## 2015-10-19 LAB — COMPLETE METABOLIC PANEL WITH GFR
ALT: 11 U/L (ref 6–29)
AST: 13 U/L (ref 10–35)
Albumin: 3.9 g/dL (ref 3.6–5.1)
Alkaline Phosphatase: 54 U/L (ref 33–115)
BUN: 10 mg/dL (ref 7–25)
CO2: 25 mmol/L (ref 20–31)
Calcium: 8.8 mg/dL (ref 8.6–10.2)
Chloride: 103 mmol/L (ref 98–110)
Creat: 0.61 mg/dL (ref 0.50–1.10)
GFR, Est African American: >89 mL/min (ref >=60)
GFR, Est Non African American: >89 mL/min (ref >=60)
Glucose, Bld: 85 mg/dL (ref 65–99)
Potassium: 4.3 mmol/L (ref 3.5–5.3)
Sodium: 137 mmol/L (ref 135–146)
Total Bilirubin: 0.4 mg/dL (ref 0.2–1.2)
Total Protein: 6.8 g/dL (ref 6.1–8.1)

## 2015-10-19 LAB — POCT URINALYSIS DIP (MANUAL ENTRY)
Bilirubin, UA: NEGATIVE
Blood, UA: NEGATIVE
Glucose, UA: NEGATIVE
Ketones, POC UA: NEGATIVE
Leukocytes, UA: NEGATIVE
Nitrite, UA: NEGATIVE
Protein Ur, POC: NEGATIVE
Spec Grav, UA: <=1.005
Urobilinogen, UA: 0.2
pH, UA: 5.5

## 2015-10-19 LAB — LIPID PANEL
Cholesterol: 144 mg/dL (ref 125–200)
HDL: 42 mg/dL — ABNORMAL LOW (ref >=46)
LDL Cholesterol: 91 mg/dL (ref <130)
Total CHOL/HDL Ratio: 3.4 Ratio (ref <=5.0)
Triglycerides: 54 mg/dL (ref <150)
VLDL: 11 mg/dL (ref <30)

## 2015-10-19 LAB — POC MICROSCOPIC URINALYSIS (UMFC): Mucus: ABSENT

## 2015-10-19 LAB — FERRITIN: Ferritin: 6 ng/mL — ABNORMAL LOW (ref 10–291)

## 2015-10-19 NOTE — Patient Instructions (Signed)
Health Maintenance, Female Adopting a healthy lifestyle and getting preventive care can go a long way to promote health and wellness. Talk with your health care provider about what schedule of regular examinations is right for you. This is a good chance for you to check in with your provider about disease prevention and staying healthy. In between checkups, there are plenty of things you can do on your own. Experts have done a lot of research about which lifestyle changes and preventive measures are most likely to keep you healthy. Ask your health care provider for more information. WEIGHT AND DIET  Eat a healthy diet  Be sure to include plenty of vegetables, fruits, low-fat dairy products, and lean protein.  Do not eat a lot of foods high in solid fats, added sugars, or salt.  Get regular exercise. This is one of the most important things you can do for your health.  Most adults should exercise for at least 150 minutes each week. The exercise should increase your heart rate and make you sweat (moderate-intensity exercise).  Most adults should also do strengthening exercises at least twice a week. This is in addition to the moderate-intensity exercise.  Maintain a healthy weight  Body mass index (BMI) is a measurement that can be used to identify possible weight problems. It estimates body fat based on height and weight. Your health care provider can help determine your BMI and help you achieve or maintain a healthy weight.  For females 20 years of age and older:   A BMI below 18.5 is considered underweight.  A BMI of 18.5 to 24.9 is normal.  A BMI of 25 to 29.9 is considered overweight.  A BMI of 30 and above is considered obese.  Watch levels of cholesterol and blood lipids  You should start having your blood tested for lipids and cholesterol at 45 years of age, then have this test every 5 years.  You may need to have your cholesterol levels checked more often if:  Your lipid  or cholesterol levels are high.  You are older than 45 years of age.  You are at high risk for heart disease.  CANCER SCREENING   Lung Cancer  Lung cancer screening is recommended for adults 55-80 years old who are at high risk for lung cancer because of a history of smoking.  A yearly low-dose CT scan of the lungs is recommended for people who:  Currently smoke.  Have quit within the past 15 years.  Have at least a 30-pack-year history of smoking. A pack year is smoking an average of one pack of cigarettes a day for 1 year.  Yearly screening should continue until it has been 15 years since you quit.  Yearly screening should stop if you develop a health problem that would prevent you from having lung cancer treatment.  Breast Cancer  Practice breast self-awareness. This means understanding how your breasts normally appear and feel.  It also means doing regular breast self-exams. Let your health care provider know about any changes, no matter how small.  If you are in your 20s or 30s, you should have a clinical breast exam (CBE) by a health care provider every 1-3 years as part of a regular health exam.  If you are 40 or older, have a CBE every year. Also consider having a breast X-ray (mammogram) every year.  If you have a family history of breast cancer, talk to your health care provider about genetic screening.  If you   are at high risk for breast cancer, talk to your health care provider about having an MRI and a mammogram every year.  Breast cancer gene (BRCA) assessment is recommended for women who have family members with BRCA-related cancers. BRCA-related cancers include:  Breast.  Ovarian.  Tubal.  Peritoneal cancers.  Results of the assessment will determine the need for genetic counseling and BRCA1 and BRCA2 testing. Cervical Cancer Your health care provider may recommend that you be screened regularly for cancer of the pelvic organs (ovaries, uterus, and  vagina). This screening involves a pelvic examination, including checking for microscopic changes to the surface of your cervix (Pap test). You may be encouraged to have this screening done every 3 years, beginning at age 21.  For women ages 30-65, health care providers may recommend pelvic exams and Pap testing every 3 years, or they may recommend the Pap and pelvic exam, combined with testing for human papilloma virus (HPV), every 5 years. Some types of HPV increase your risk of cervical cancer. Testing for HPV may also be done on women of any age with unclear Pap test results.  Other health care providers may not recommend any screening for nonpregnant women who are considered low risk for pelvic cancer and who do not have symptoms. Ask your health care provider if a screening pelvic exam is right for you.  If you have had past treatment for cervical cancer or a condition that could lead to cancer, you need Pap tests and screening for cancer for at least 20 years after your treatment. If Pap tests have been discontinued, your risk factors (such as having a new sexual partner) need to be reassessed to determine if screening should resume. Some women have medical problems that increase the chance of getting cervical cancer. In these cases, your health care provider may recommend more frequent screening and Pap tests. Colorectal Cancer  This type of cancer can be detected and often prevented.  Routine colorectal cancer screening usually begins at 45 years of age and continues through 45 years of age.  Your health care provider may recommend screening at an earlier age if you have risk factors for colon cancer.  Your health care provider may also recommend using home test kits to check for hidden blood in the stool.  A small camera at the end of a tube can be used to examine your colon directly (sigmoidoscopy or colonoscopy). This is done to check for the earliest forms of colorectal  cancer.  Routine screening usually begins at age 50.  Direct examination of the colon should be repeated every 5-10 years through 45 years of age. However, you may need to be screened more often if early forms of precancerous polyps or small growths are found. Skin Cancer  Check your skin from head to toe regularly.  Tell your health care provider about any new moles or changes in moles, especially if there is a change in a mole's shape or color.  Also tell your health care provider if you have a mole that is larger than the size of a pencil eraser.  Always use sunscreen. Apply sunscreen liberally and repeatedly throughout the day.  Protect yourself by wearing long sleeves, pants, a wide-brimmed hat, and sunglasses whenever you are outside. HEART DISEASE, DIABETES, AND HIGH BLOOD PRESSURE   High blood pressure causes heart disease and increases the risk of stroke. High blood pressure is more likely to develop in:  People who have blood pressure in the high end   of the normal range (130-139/85-89 mm Hg).  People who are overweight or obese.  People who are African American.  If you are 38-23 years of age, have your blood pressure checked every 3-5 years. If you are 61 years of age or older, have your blood pressure checked every year. You should have your blood pressure measured twice--once when you are at a hospital or clinic, and once when you are not at a hospital or clinic. Record the average of the two measurements. To check your blood pressure when you are not at a hospital or clinic, you can use:  An automated blood pressure machine at a pharmacy.  A home blood pressure monitor.  If you are between 45 years and 39 years old, ask your health care provider if you should take aspirin to prevent strokes.  Have regular diabetes screenings. This involves taking a blood sample to check your fasting blood sugar level.  If you are at a normal weight and have a low risk for diabetes,  have this test once every three years after 45 years of age.  If you are overweight and have a high risk for diabetes, consider being tested at a younger age or more often. PREVENTING INFECTION  Hepatitis B  If you have a higher risk for hepatitis B, you should be screened for this virus. You are considered at high risk for hepatitis B if:  You were born in a country where hepatitis B is common. Ask your health care provider which countries are considered high risk.  Your parents were born in a high-risk country, and you have not been immunized against hepatitis B (hepatitis B vaccine).  You have HIV or AIDS.  You use needles to inject street drugs.  You live with someone who has hepatitis B.  You have had sex with someone who has hepatitis B.  You get hemodialysis treatment.  You take certain medicines for conditions, including cancer, organ transplantation, and autoimmune conditions. Hepatitis C  Blood testing is recommended for:  Everyone born from [birth year] through 1966.  Anyone with known risk factors for hepatitis C. Sexually transmitted infections (STIs)  You should be screened for sexually transmitted infections (STIs) including gonorrhea and chlamydia if:  You are sexually active and are younger than 45 years of age.  You are older than 45 years of age and your health care provider tells you that you are at risk for this type of infection.  Your sexual activity has changed since you were last screened and you are at an increased risk for chlamydia or gonorrhea. Ask your health care provider if you are at risk.  If you do not have HIV, but are at risk, it may be recommended that you take a prescription medicine daily to prevent HIV infection. This is called pre-exposure prophylaxis (PrEP). You are considered at risk if:  You are sexually active and do not regularly use condoms or know the HIV status of your partner(s).  You take drugs by injection.  You are sexually  active with a partner who has HIV. Talk with your health care provider about whether you are at high risk of being infected with HIV. If you choose to begin PrEP, you should first be tested for HIV. You should then be tested every 3 months for as long as you are taking PrEP.  PREGNANCY   If you are premenopausal and you may become pregnant, ask your health care provider about preconception counseling.  If you may  become pregnant, take 400 to 800 micrograms (mcg) of folic acid every day.  If you want to prevent pregnancy, talk to your health care provider about birth control (contraception). OSTEOPOROSIS AND MENOPAUSE   Osteoporosis is a disease in which the bones lose minerals and strength with aging. This can result in serious bone fractures. Your risk for osteoporosis can be identified using a bone density scan.  If you are 61 years of age or older, or if you are at risk for osteoporosis and fractures, ask your health care provider if you should be screened.  Ask your health care provider whether you should take a calcium or vitamin D supplement to lower your risk for osteoporosis.  Menopause may have certain physical symptoms and risks.  Hormone replacement therapy may reduce some of these symptoms and risks. Talk to your health care provider about whether hormone replacement therapy is right for you.  HOME CARE INSTRUCTIONS   Schedule regular health, dental, and eye exams.  Stay current with your immunizations.   Do not use any tobacco products including cigarettes, chewing tobacco, or electronic cigarettes.  If you are pregnant, do not drink alcohol.  If you are breastfeeding, limit how much and how often you drink alcohol.  Limit alcohol intake to no more than 1 drink per day for nonpregnant women. One drink equals 12 ounces of beer, 5 ounces of wine, or 1 ounces of hard liquor.  Do not use street drugs.  Do not share needles.  Ask your health care provider for help if  you need support or information about quitting drugs.  Tell your health care provider if you often feel depressed.  Tell your health care provider if you have ever been abused or do not feel safe at home.   This information is not intended to replace advice given to you by your health care provider. Make sure you discuss any questions you have with your health care provider.   Document Released: 05/21/2011 Document Revised: 11/26/2014 Document Reviewed: 10/07/2013 Elsevier Interactive Patient Education [employer].

## 2015-10-19 NOTE — Progress Notes (Signed)
Subjective:  This chart was scribed for [name] MD, by [name], at [facility].  This patient was seen in room 10 and the patient's care was started at 10:28 AM.    Patient ID: [name], female    DOB: July 01, 1971, 44 y.o.   MRN: [id number] Chief Complaint  Patient presents with  . Annual Exam    fasting labs, pap and mammo 6 weeks ago    HPI  HPI Comments: [name] is a 44 y.o. female who presents to the [facility] for an annual physical exam.  Patient is originally from [place]. Patient goes to Zumb[employer] class occasionally for exercise but has not gone recently due to the cold weather. She is compliant with her mammograms/check ups and sees Dr. Matthew Saras.  She does not use any medications.  She is up to date with her vaccinations.  She denies difficulty with sleeping.   Patient has 2 kids, (29 and 37).    Patient works for [employer].    There are no active problems to display for this patient.  Past Medical History  Diagnosis Date  . PONV (postoperative nausea and vomiting)   . Headache(784.0)   . Anemia    Past Surgical History  Procedure Laterality Date  . Dilation and curettage of uterus  2000  . Dilitation & currettage/hystroscopy with novasure ablation N/A 02/05/2013    Procedure: DILATATION & CURETTAGE/HYSTEROSCOPY WITH NOVASURE ABLATION;  Surgeon: Margarette Asal, MD;  Location: [facility];  Service: Gynecology;  Laterality: N/A;  . Laparoscopic tubal ligation Bilateral 02/05/2013    Procedure: LAPAROSCOPIC TUBAL LIGATION;  Surgeon: Margarette Asal, MD;  Location: [facility];  Service: Gynecology;  Laterality: Bilateral;  attempted tubal ligation   No Known Allergies Prior to Admission medications   Medication Sig Start Date End Date Taking? Authorizing Provider  Multiple Vitamins-Minerals (CENTRUM ADULTS) TABS Take by mouth.   Yes Historical Provider, MD  ferrous sulfate 325 (65 FE) MG  tablet Take 325 mg by mouth daily with breakfast.    Historical Provider, MD  HYDROcodone-ibuprofen (VICOPROFEN) 7.5-200 MG per tablet Take 1 tablet by mouth every 8 (eight) hours as needed for pain. Patient not taking: Reported on 10/19/2015 02/05/13   Molli Posey, MD   Social History   Social History  . Marital Status: Married    Spouse Name: N/A  . Number of Children: N/A  . Years of Education: N/A   Occupational History  . Not on file.   Social History Main Topics  . Smoking status: Never Smoker   . Smokeless tobacco: Not on file  . Alcohol Use: Yes     Comment: occas  . Drug Use: No  . Sexual Activity: Not on file   Other Topics Concern  . Not on file   Social History Narrative         Review of Systems  Constitutional: Negative for fever and chills.  Eyes: Negative for pain, redness and itching.  Respiratory: Negative for cough, choking and shortness of breath.   Gastrointestinal: Negative for nausea and vomiting.  Musculoskeletal: Negative for neck pain and neck stiffness.  Neurological: Negative for syncope and speech difficulty.       Objective:   Physical Exam  Constitutional: She is oriented to person, place, and time. She appears well-developed and well-nourished. No distress.  HENT:  Head: Normocephalic and atraumatic.  Right Ear: External ear normal.  Left Ear:  External ear normal.  Eyes: Conjunctivae and EOM are normal. Pupils are equal, round, and reactive to light. Right eye exhibits no discharge. Left eye exhibits no discharge.  Neck: Normal range of motion. Neck supple.  Cardiovascular: Normal rate, regular rhythm and normal heart sounds.  Exam reveals no friction rub.   No murmur heard. Pulmonary/Chest: Effort normal and breath sounds normal. No respiratory distress. She has no wheezes. She has no rales.  Abdominal: Soft. Bowel sounds are normal. She exhibits no distension. There is no tenderness. There is no rebound and no guarding.    Musculoskeletal: Normal range of motion.  Neurological: She is alert and oriented to person, place, and time.  Skin: Skin is warm and dry.  Psychiatric: She has a normal mood and affect. Her behavior is normal.   Filed Vitals:   10/19/15 1012  BP: 120/72  Pulse: 85  Temp: 98.9 F (37.2 C)  TempSrc: Oral  Resp: 18  Height: 5\' 3"  (1.6 m)  Weight: 268 lb (121.564 kg)  SpO2: 98%      Assessment & Plan:   This chart was scribed in my presence and reviewed by me personally.    ICD-9-CM ICD-10-CM   1. Annual physical exam V70.0 Z00.00 POCT CBC     POCT urinalysis dipstick     POCT Microscopic Urinalysis ([facility])     COMPLETE METABOLIC PANEL WITH GFR     Lipid panel  2. Other iron deficiency anemias 280.8 D50.8 Ferritin     Signed, [name], MD

## 2015-12-13 DIAGNOSIS — N946 Dysmenorrhea, unspecified: Secondary | ICD-10-CM | POA: Diagnosis not present

## 2015-12-13 DIAGNOSIS — D259 Leiomyoma of uterus, unspecified: Secondary | ICD-10-CM | POA: Diagnosis not present

## 2015-12-13 DIAGNOSIS — R1031 Right lower quadrant pain: Secondary | ICD-10-CM | POA: Diagnosis not present

## 2015-12-13 DIAGNOSIS — N920 Excessive and frequent menstruation with regular cycle: Secondary | ICD-10-CM | POA: Diagnosis not present

## 2015-12-13 MED FILL — LARIN 21 1-20 TABLET: 1-20 | 84 days supply | Qty: 84 | Fill #0

## 2016-02-13 ENCOUNTER — Ambulatory Visit (INDEPENDENT_AMBULATORY_CARE_PROVIDER_SITE_OTHER): Payer: 59 | Admitting: Family Medicine

## 2016-02-13 VITALS — BP 104/82 | HR 82 | Temp 98.1°F | Resp 16 | Ht 63.0 in | Wt 273.2 lb

## 2016-02-13 DIAGNOSIS — D509 Iron deficiency anemia, unspecified: Secondary | ICD-10-CM

## 2016-02-13 NOTE — Patient Instructions (Addendum)
IF you received an x-ray today, you will receive an invoice from [facility]. Please contact [facility] at [phone] with questions or concerns regarding your invoice.   IF you received labwork today, you will receive an invoice from [employer]. Please contact [facility] at [phone] with questions or concerns regarding your invoice.   Our billing staff will not be able to assist you with questions regarding bills from these companies.  You will be contacted with the lab results as soon as they are available. The fastest way to get your results is to activate your My Chart account. Instructions are located on the last page of this paperwork. If you have not heard from [place] regarding the results in 2 weeks, please contact this office.     Iron-Rich Diet Iron is a mineral that helps your body to produce hemoglobin. Hemoglobin is a protein in your red blood cells that carries oxygen to your body's tissues. Eating too little iron may cause you to feel weak and tired, and it can increase your risk for infection. Eating enough iron is necessary for your body's metabolism, muscle function, and nervous system. Iron is naturally found in many foods. It can also be added to foods or fortified in foods. There are two types of dietary iron:  Heme iron. Heme iron is absorbed by the body more easily than nonheme iron. Heme iron is found in meat, poultry, and fish.  Nonheme iron. Nonheme iron is found in dietary supplements, iron-fortified grains, beans, and vegetables. You may need to follow an iron-rich diet if:  You have been diagnosed with iron deficiency or iron-deficiency anemia.  You have a condition that prevents you from absorbing dietary iron, such as:  Infection in your intestines.  Celiac disease. This involves long-lasting (chronic) inflammation of your intestines.  You do not eat enough iron.  You eat a diet that is high in foods  that impair iron absorption.  You have lost a lot of blood.  You have heavy bleeding during your menstrual cycle.  You are pregnant. WHAT IS MY PLAN? Your health care provider may help you to determine how much iron you need per day based on your condition. Generally, when a person consumes sufficient amounts of iron in the diet, the following iron needs are met:  Men.  86-79 years old: 11 mg per day.  65-60 years old: 8 mg per day.  Women.   85-2 years old: 15 mg per day.  39-20 years old: 18 mg per day.  Over 93 years old: 8 mg per day.  Pregnant women: 27 mg per day.  Breastfeeding women: 9 mg per day. WHAT DO I NEED TO KNOW ABOUT AN IRON-RICH DIET?  Eat fresh fruits and vegetables that are high in vitamin C along with foods that are high in iron. This will help increase the amount of iron that your body absorbs from food, especially with foods containing nonheme iron. Foods that are high in vitamin C include oranges, peppers, tomatoes, and mango.  Take iron supplements only as directed by your health care provider. Overdose of iron can be life-threatening. If you were prescribed iron supplements, take them with orange juice or a vitamin C supplement.  Cook foods in pots and pans that are made from iron.   Eat nonheme iron-containing foods alongside foods that are high in heme iron. This helps to improve your iron absorption.   Certain foods and drinks contain compounds that impair  Avoid eating these foods in the same meal as iron-rich foods or with iron supplements. These include:  Coffee, black tea, and red wine.  Milk, dairy products, and foods that are high in calcium.  Beans, soybeans, and peas.  Whole grains.  When eating foods that contain both nonheme iron and compounds that impair iron absorption, follow these tips to absorb iron better.   Soak beans overnight before cooking.  Soak whole grains overnight and drain them before  using.  Ferment flours before baking, such as using yeast in bread dough. WHAT FOODS CAN I EAT? Grains Iron-fortified breakfast cereal. Iron-fortified whole-wheat bread. Enriched rice. Sprouted grains. Vegetables Spinach. Potatoes with skin. Green peas. Broccoli. Red and green bell peppers. Fermented vegetables. Fruits Prunes. Raisins. Oranges. Strawberries. Mango. Grapefruit. Meats and Other Protein Sources Beef liver. Oysters. Beef. Shrimp. Turkey. Chicken. Tuna. Sardines. Chickpeas. Nuts. Tofu. Beverages Tomato juice. Fresh orange juice. Prune juice. Hibiscus tea. Fortified instant breakfast shakes. Condiments Tahini. Fermented soy sauce. Sweets and Desserts Black-strap molasses.  Other Wheat germ. The items listed above may not be a complete list of recommended foods or beverages. Contact your dietitian for more options. WHAT FOODS ARE NOT RECOMMENDED? Grains Whole grains. Bran cereal. Bran flour. Oats. Vegetables Artichokes. Brussels sprouts. Kale. Fruits Blueberries. Raspberries. Strawberries. Figs. Meats and Other Protein Sources Soybeans. Products made from soy protein. Dairy Milk. Cream. Cheese. Yogurt. Cottage cheese. Beverages Coffee. Black tea. Red wine. Sweets and Desserts Cocoa. Chocolate. Ice cream. Other Basil. Oregano. Parsley. The items listed above may not be a complete list of foods and beverages to avoid. Contact your dietitian for more information.   This information is not intended to replace advice given to you by your health care provider. Make sure you discuss any questions you have with your health care provider.   Document Released: 06/19/2005 Document Revised: 11/26/2014 Document Reviewed: 06/02/2014 Elsevier Interactive Patient Education 2016 Elsevier Inc.   Iron Deficiency Anemia, Adult Anemia is a condition in which there are less red blood cells or hemoglobin in the blood than normal. Hemoglobin is the part of red  blood cells that carries oxygen. Iron deficiency anemia is anemia caused by too little iron. It is the most common type of anemia. It may leave you tired and short of breath. CAUSES   Lack of iron in the diet.  Poor absorption of iron, as seen with intestinal disorders.  Intestinal bleeding.  Heavy periods. SIGNS AND SYMPTOMS  Mild anemia may not be noticeable. Symptoms may include:  Fatigue.  Headache.  Pale skin.  Weakness.  Tiredness.  Shortness of breath.  Dizziness.  Cold hands and feet.  Fast or irregular heartbeat. DIAGNOSIS  Diagnosis requires a thorough evaluation and physical exam by your health care provider. Blood tests are generally used to confirm iron deficiency anemia. Additional tests may be done to find the underlying cause of your anemia. These may include:  Testing for blood in the stool (fecal occult blood test).  A procedure to see inside the colon and rectum (colonoscopy).  A procedure to see inside the esophagus and stomach (endoscopy). TREATMENT  Iron deficiency anemia is treated by correcting the cause of the deficiency. Treatment may involve:  Adding iron-rich foods to your diet.  Taking iron supplements. Pregnant or breastfeeding women need to take extra iron because their normal diet usually does not provide the required amount.  Taking vitamins. Vitamin C improves the absorption of iron. Your health care provider may recommend that you take your iron   tablets with a glass of orange juice or vitamin C supplement.  Medicines to make heavy menstrual flow lighter.  Surgery. HOME CARE INSTRUCTIONS   Take iron as directed by your health care provider.  If you cannot tolerate taking iron supplements by mouth, talk to your health care provider about taking them through a vein (intravenously) or an injection into a muscle.  For the best iron absorption, iron supplements should be taken on an empty stomach. If you cannot tolerate them on an  empty stomach, you may need to take them with food.  Do not drink milk or take antacids at the same time as your iron supplements. Milk and antacids may interfere with the absorption of iron.  Iron supplements can cause constipation. Make sure to include fiber in your diet to prevent constipation. A stool softener may also be recommended.  Take vitamins as directed by your health care provider.  Eat a diet rich in iron. Foods high in iron include liver, lean beef, whole-grain bread, eggs, dried fruit, and dark green leafy vegetables. SEEK IMMEDIATE MEDICAL CARE IF:   You faint. If this happens, do not drive. Call your local emergency services (911 in [place] if no other help is available.  You have chest pain.  You feel nauseous or vomit.  You have severe or increased shortness of breath with activity.  You feel weak.  You have a rapid heartbeat.  You have unexplained sweating.  You become light-headed when getting up from a chair or bed. MAKE SURE YOU:   Understand these instructions.  Will watch your condition.  Will get help right away if you are not doing well or get worse.   This information is not intended to replace advice given to you by your health care provider. Make sure you discuss any questions you have with your health care provider.   Document Released: 11/02/2000 Document Revised: 11/26/2014 Document Reviewed: 07/13/2013 Elsevier Interactive Patient Education 2016 Elsevier Inc.  

## 2016-02-13 NOTE — Progress Notes (Signed)
Subjective:    Patient ID: [name], female    DOB: 1971-08-17, 45 y.o.   MRN: [id number] Chief Complaint  Patient presents with  . blood work recheck    recheck her ferritin    HPI  Has been taking ferrous sulfate 325 (65)mg one a day with breakfast for the past 4 months now.  Had an ablation 3 yrs ago and anemia initially improved but has since recurred - likely still from menses. Pt gets a good amount of iron in her diet as well.  Past Medical History  Diagnosis Date  . PONV (postoperative nausea and vomiting)   . Headache(784.0)   . Anemia    Current Outpatient Prescriptions on File Prior to Visit  Medication Sig Dispense Refill  . Multiple Vitamins-Minerals (CENTRUM ADULTS) TABS Take by mouth. Reported on 02/13/2016     No current facility-administered medications on file prior to visit.   No Known Allergies    Review of Systems  Constitutional: Negative for fever, chills, diaphoresis, activity change, appetite change, fatigue and unexpected weight change.  Gastrointestinal: Positive for constipation. Negative for vomiting, abdominal pain, blood in stool and abdominal distention.  Neurological: Negative for dizziness, syncope, weakness, light-headedness and numbness.       Objective:  BP 104/82 mmHg  Pulse 82  Temp(Src) 98.1 F (36.7 C) (Oral)  Resp 16  Ht 5\' 3"  (1.6 m)  Wt 273 lb 3.2 oz (123.923 kg)  BMI 48.41 kg/m2  SpO2 96%  LMP 02/06/2016  Physical Exam  Constitutional: She is oriented to person, place, and time. She appears well-developed and well-nourished. No distress.  HENT:  Head: Normocephalic and atraumatic.  Right Ear: External ear normal.  Left Ear: External ear normal.  Eyes: Conjunctivae are normal. No scleral icterus.  Neck: Normal range of motion. Neck supple. No thyromegaly present.  Cardiovascular: Normal rate, regular rhythm, normal heart sounds and intact distal pulses.   Pulmonary/Chest: Effort normal and breath sounds normal. No  respiratory distress.  Musculoskeletal: She exhibits no edema.  Lymphadenopathy:    She has no cervical adenopathy.  Neurological: She is alert and oriented to person, place, and time.  Skin: Skin is warm and dry. She is not diaphoretic. No erythema.  Psychiatric: She has a normal mood and affect. Her behavior is normal.          Assessment & Plan:   1. Anemia, iron deficiency   Anemia improved from 10.2 -> 11.1 over the past 4 mos but ferritin unchanged so cont daily iron supp - switch to empty stomach and with vit C supp. Rehceck in 6 mos  Orders Placed This Encounter  Procedures  . CBC  . Ferritin    Meds ordered this encounter  Medications  . norethindrone (AYGESTIN) 5 MG tablet    Sig: Take by mouth daily.  . Iron TABS    Sig: Take 65 mg by mouth.    Delman Cheadle, MD MPH  Results for orders placed or performed in visit on 02/13/16  CBC  Result Value Ref Range   WBC 6.4 4.0 - 10.5 K/uL   RBC 4.82 3.87 - 5.11 MIL/uL   Hemoglobin 11.1 (L) 12.0 - 15.0 g/dL   HCT 34.4 (L) 36.0 - 46.0 %   MCV 71.4 (L) 78.0 - 100.0 fL   MCH 23.0 (L) 26.0 - 34.0 pg   MCHC 32.3 30.0 - 36.0 g/dL   RDW 16.2 (H) 11.5 - 15.5 %   Platelets 378 150 -  400 K/uL   MPV 9.0 8.6 - 12.4 fL  Ferritin  Result Value Ref Range   Ferritin 7 (L) 10 - 232 ng/mL

## 2016-02-14 LAB — CBC
HCT: 34.4 % — ABNORMAL LOW (ref 36.0–46.0)
Hemoglobin: 11.1 g/dL — ABNORMAL LOW (ref 12.0–15.0)
MCH: 23 pg — AB (ref 26.0–34.0)
MCHC: 32.3 g/dL (ref 30.0–36.0)
MCV: 71.4 fL — AB (ref 78.0–100.0)
MPV: 9 fL (ref 8.6–12.4)
PLATELETS: 378 10*3/uL (ref 150–400)
RBC: 4.82 MIL/uL (ref 3.87–5.11)
RDW: 16.2 % — AB (ref 11.5–15.5)
WBC: 6.4 10*3/uL (ref 4.0–10.5)

## 2016-02-14 LAB — FERRITIN: Ferritin: 7 ng/mL — ABNORMAL LOW (ref 10–232)

## 2016-02-25 ENCOUNTER — Encounter: Payer: Self-pay | Admitting: Family Medicine

## 2016-08-29 DIAGNOSIS — H5203 Hypermetropia, bilateral: Secondary | ICD-10-CM | POA: Diagnosis not present

## 2016-10-30 ENCOUNTER — Ambulatory Visit (INDEPENDENT_AMBULATORY_CARE_PROVIDER_SITE_OTHER): Payer: 59 | Admitting: Family Medicine

## 2016-10-30 VITALS — BP 140/80 | HR 114 | Temp 98.4°F | Resp 17 | Ht 63.0 in | Wt 276.0 lb

## 2016-10-30 DIAGNOSIS — Z6841 Body Mass Index (BMI) 40.0 and over, adult: Secondary | ICD-10-CM | POA: Diagnosis not present

## 2016-10-30 DIAGNOSIS — IMO0001 Reserved for inherently not codable concepts without codable children: Secondary | ICD-10-CM

## 2016-10-30 DIAGNOSIS — Z1329 Encounter for screening for other suspected endocrine disorder: Secondary | ICD-10-CM | POA: Diagnosis not present

## 2016-10-30 DIAGNOSIS — R140 Abdominal distension (gaseous): Secondary | ICD-10-CM | POA: Diagnosis not present

## 2016-10-30 DIAGNOSIS — R1084 Generalized abdominal pain: Secondary | ICD-10-CM

## 2016-10-30 DIAGNOSIS — R110 Nausea: Secondary | ICD-10-CM

## 2016-10-30 DIAGNOSIS — E669 Obesity, unspecified: Secondary | ICD-10-CM | POA: Diagnosis not present

## 2016-10-30 DIAGNOSIS — D509 Iron deficiency anemia, unspecified: Secondary | ICD-10-CM | POA: Diagnosis not present

## 2016-10-30 DIAGNOSIS — Z0000 Encounter for general adult medical examination without abnormal findings: Secondary | ICD-10-CM | POA: Diagnosis not present

## 2016-10-30 DIAGNOSIS — R000 Tachycardia, unspecified: Secondary | ICD-10-CM | POA: Diagnosis not present

## 2016-10-30 NOTE — Patient Instructions (Addendum)
Follow up with gynecologist as planned this Monday.  Talk about your bloating and abdominal pain symptoms with menses. I will order an ultrasound of abdomen to look at your gallbladder and other possible causes of bloating. I will also look at your liver tests. Avoid fried or fatty foods for now and recheck in next few weeks. Depending on symptoms at that time, you may need to meet with a  gastroenterologist.   Return to the clinic or go to the nearest emergency room if any of your symptoms worsen or new symptoms occur.  Avoid fried food, exercise most if not all days per week, and recheck to discuss weight in next 6 months. If difficulty with weight loss, would recommend meeting with nutritionist and possible bariatric surgeon/specialist.     Abdominal Pain, Adult Abdominal pain can be caused by many things. Often, abdominal pain is not serious and it gets better with no treatment or by being treated at home. However, sometimes abdominal pain is serious. Your health care provider will do a medical history and a physical exam to try to determine the cause of your abdominal pain. Follow these instructions at home:  Take over-the-counter and prescription medicines only as told by your health care provider. Do not take a laxative unless told by your health care provider.  Drink enough fluid to keep your urine clear or pale yellow.  Watch your condition for any changes.  Keep all follow-up visits as told by your health care provider. This is important. Contact a health care provider if:  Your abdominal pain changes or gets worse.  You are not hungry or you lose weight without trying.  You are constipated or have diarrhea for more than 2-3 days.  You have pain when you urinate or have a bowel movement.  Your abdominal pain wakes you up at night.  Your pain gets worse with meals, after eating, or with certain foods.  You are throwing up and cannot keep anything down.  You have a  fever. Get help right away if:  Your pain does not go away as soon as your health care provider told you to expect.  You cannot stop throwing up.  Your pain is only in areas of the abdomen, such as the right side or the left lower portion of the abdomen.  You have bloody or black stools, or stools that look like tar.  You have severe pain, cramping, or bloating in your abdomen.  You have signs of dehydration, such as:  Dark urine, very little urine, or no urine.  Cracked lips.  Dry mouth.  Sunken eyes.  Sleepiness.  Weakness. This information is not intended to replace advice given to you by your health care provider. Make sure you discuss any questions you have with your health care provider. Document Released: 08/15/2005 Document Revised: 05/25/2016 Document Reviewed: 04/18/2016 Elsevier Interactive Patient Education  2017 [employer]  Get These Tests 1. Blood Pressure- Have your blood pressure checked once a year by your health care provider.  Normal blood pressure is 120/80. 2. Weight- Have your body mass index (BMI) calculated to screen for obesity.  BMI is measure of body fat based on height and weight.  You can also calculate your own BMI at [employer]velB[employer]it. 3. Cholesterol- Have your cholesterol checked every 5 years starting at age 27 then yearly starting at age 104. 80. Chlamydia, HIV, and other sexually transmitted diseases- Get screened every year until age 28, then  within three months of each new sexual provider. 5. Pap Test - Every 1-5 years; discuss with your health care provider. 6. Mammogram- Every 1-2 years starting at age 58--50  Take these medicines  Calcium with Vitamin D-Your body needs 1200 mg of Calcium each day and [phone] IU of Vitamin D daily.  Your body can only absorb 500 mg of Calcium at a time so Calcium must be taken in 2 or 3 divided doses throughout the day.  Multivitamin with folic acid- Once daily if  it is possible for you to become pregnant.  Get these Immunizations  Gardasil-Series of three doses; prevents HPV related illness such as genital warts and cervical cancer.  Menactra-Single dose; prevents meningitis.  Tetanus shot- Every 10 years.  Flu shot-Every year.  Take these steps 1. Do not smoke-Your healthcare provider can help you quit.  For tips on how to quit go to www.smokefree.gov or call 1-800 QUITNOW. 2. Be physically active- Exercise 5 days a week for at least 30 minutes.  If you are not already physically active, start slow and gradually work up to 30 minutes of moderate physical activity.  Examples of moderate activity include walking briskly, dancing, swimming, bicycling, etc. 3. Breast Cancer- A self breast exam every month is important for early detection of breast cancer.  For more information and instruction on self breast exams, ask your healthcare provider or https://www.patel.info/. 4. Eat a healthy diet- Eat a variety of healthy foods such as fruits, vegetables, whole grains, low fat milk, low fat cheeses, yogurt, lean meats, poultry and fish, beans, nuts, tofu, etc.  For more information go to www. Thenutritionsource.org 5. Drink alcohol in moderation- Limit alcohol intake to one drink or less per day. Never drink and drive. 6. Depression- Your emotional health is as important as your physical health.  If you're feeling down or losing interest in things you normally enjoy please talk to your healthcare provider about being screened for depression. 7. Dental visit- Brush and floss your teeth twice daily; visit your dentist twice a year. 8. Eye doctor- Get an eye exam at least every 2 years. 9. Helmet use- Always wear a helmet when riding a bicycle, motorcycle, rollerblading or skateboarding. 36. Safe sex- If you may be exposed to sexually transmitted infections, use a condom. 11. Seat belts- Seat belts can save your live; always wear  one. 12. Smoke/Carbon Monoxide detectors- These detectors need to be installed on the appropriate level of your home. Replace batteries at least once a year. 13. Skin cancer- When out in the sun please cover up and use sunscreen 15 SPF or higher. 14. Violence- If anyone is threatening or hurting you, please tell your healthcare provider.         IF you received an x-ray today, you will receive an invoice from [facility]. Please contact [facility] at [phone] with questions or concerns regarding your invoice.   IF you received labwork today, you will receive an invoice from [employer]. Please contact [facility] at [phone] with questions or concerns regarding your invoice.   Our billing staff will not be able to assist you with questions regarding bills from these companies.  You will be contacted with the lab results as soon as they are available. The fastest way to get your results is to activate your My Chart account. Instructions are located on the last page of this paperwork. If you have not heard from [place] regarding the results in 2 weeks, please  contact this office.

## 2016-10-30 NOTE — Progress Notes (Addendum)
Subjective:  By signing my name below, I, [name], attest that this documentation has been prepared under the direction and in the presence of Deborah Ray, MD. Electronically Signed: Moises Marks, [place]. 10/30/2016 , 12:43 PM .  Patient was seen in Room 8 .   Patient ID: [name], female    DOB: 10/22/1971, 45 y.o.   MRN: [id number] Chief Complaint  Patient presents with  . Annual Exam    PT ENCOURAGED TO FINISH FILLING OUT PAPERWORK, PT GIVEN GOWN. URINE COLLECTED.    HPI [name] is a 45 y.o. female Here for annual physical. New patient to me. Previously followed by Dr. Linna Darner, Dr. Joseph Art, and Dr. Brigitte Pulse in March. 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 She is originally from [place] and has been here for 11 years.   Abdominal Pain Patient complains having abdominal pain before, during and after her periods, ongoing for past 2 years now. She's been having these after having uterine ablation about 3 years ago, done by Dr. Matthew Saras. She was given norethindrone tablets, once a day for abdominal cramping and menstrual bleeding. She's been out of her medication for 3 months now. She will follow up with him in 6 days. She denies fever, vomiting, diarrhea, constipation, or Marks in stool. She sometimes has nausea with food, ongoing for a year now. She takes advil every 6 hours as needed for the pain.   She notes her abdominal bloating has been feeling worse for a few months now. She had CT abdomen/pelvis in March 2012, which showed positive SI joint increased density, small [id number] umbilical hernia with fat only, abdominal and pelvic organs appear normal for age at that time.   Iron deficiency anemia H/o iron deficiency anemia, last discussed in March.   Lab Results  Component Value Date   WBC 6.4 02/13/2016   HGB 11.1 (L) 02/13/2016   HCT 34.4 (L) 02/13/2016   MCV 71.4 (L) 02/13/2016   PLT 378 02/13/2016   Thought due to heavy menses at that time. She  had uterine ablation done 3 years prior, but still thought due to heavy menses. Her hemoglobin improved from 10.2 to 11.1 at that time, but ferritin unchanged. She was recommended to continue daily iron supplement but to take on empty stomach and with vitamin C supplement.   She's been taking her iron supplement with food and not with Vitamin C. She denies palpitations. She reports her menses aren't as heavy anymore.   Family history No family history on intake paperwork. She denies any known family history of chronic illnesses.   Cancer Screening She is followed by OBGYN Dr. Matthew Saras. She had a negative pap smear testing done in Oct 2015. She will have pap smear testing done at next visit in 6 days. She is also scheduled mammogram to be done on Dec 22nd. She denies family history of breast cancer.   Immunizations  There is no immunization history on file for this patient.   Tetanus: She believes she's had it done within past 10 years.  Flu: She states she's had it done this season.   STI testing She denies any concern for STI screening.   Depression Depression screen [facility] 2/9 10/30/2016 02/13/2016 10/19/2015 10/19/2015  Decreased Interest 0 0 0 0  Down, Depressed, Hopeless 0 0 0 0  PHQ - 2 Score 0 0 0 0    Vision  Visual Acuity Screening   Right eye Left eye Both eyes  Without correction:  With correction: 20/15 20/15 20/15    She last saw her eye doctor 2 months ago.   Dentist She last saw her dentist 3 months ago; sees dentist twice a year.   Exercise She reports exercising sometimes, about twice a week. She has a rare soda when she's craving it. She denies eating fast food; however, she enjoys fried foods and cooks it at home.   There are no active problems to display for this patient.  Past Medical History:  Diagnosis Date  . Anemia   . Headache(784.0)   . PONV (postoperative nausea and vomiting)    Past Surgical History:  Procedure Laterality Date  . DILATION  AND CURETTAGE OF UTERUS  2000  . DILITATION & CURRETTAGE/HYSTROSCOPY WITH NOVASURE ABLATION N/A 02/05/2013   Procedure: DILATATION & CURETTAGE/HYSTEROSCOPY WITH NOVASURE ABLATION;  Surgeon: Margarette Asal, MD;  Location: [facility];  Service: Gynecology;  Laterality: N/A;  . LAPAROSCOPIC TUBAL LIGATION Bilateral 02/05/2013   Procedure: LAPAROSCOPIC TUBAL LIGATION;  Surgeon: Margarette Asal, MD;  Location: [facility];  Service: Gynecology;  Laterality: Bilateral;  attempted tubal ligation   No Known Allergies Prior to Admission medications   Medication Sig Start Date End Date Taking? Authorizing Provider  Iron TABS Take 65 mg by mouth.   Yes Historical Provider, MD  Multiple Vitamins-Minerals (CENTRUM ADULTS) TABS Take by mouth. Reported on 02/13/2016   Yes Historical Provider, MD  norethindrone (AYGESTIN) 5 MG tablet Take by mouth daily.   Yes Historical Provider, MD   Social History   Social History  . Marital status: Married    Spouse name: N/A  . Number of children: N/A  . Years of education: N/A   Occupational History  . Not on file.   Social History Main Topics  . Smoking status: Never Smoker  . Smokeless tobacco: Not on file  . Alcohol use Yes     Comment: occas  . Drug use: No  . Sexual activity: Not on file   Other Topics Concern  . Not on file   Social History Narrative  . No narrative on file   Review of Systems 13 point ROS - positive for visual problems and abdominal pain      Objective:   Physical Exam  Constitutional: She is oriented to person, place, and time. She appears well-developed and well-nourished.  HENT:  Head: Normocephalic and atraumatic.  Right Ear: External ear normal.  Left Ear: External ear normal.  Mouth/Throat: Oropharynx is clear and moist.  Eyes: Conjunctivae are normal. Pupils are equal, round, and reactive to light.  Neck: Normal range of motion. Neck supple. No thyromegaly present.  Cardiovascular: Regular rhythm, normal heart sounds  and intact distal pulses.  Tachycardia present.   No murmur heard. Pulmonary/Chest: Effort normal and breath sounds normal. No respiratory distress. She has no wheezes.  Abdominal: Soft. Bowel sounds are normal. There is tenderness (diffuse over lower abdomen) in the right lower quadrant and left lower quadrant. There is no rebound, no guarding and negative Murphy's sign.  Musculoskeletal: Normal range of motion. She exhibits no edema or tenderness.  Trace non pitting edema in lower extremities  Lymphadenopathy:    She has no cervical adenopathy.  Neurological: She is alert and oriented to person, place, and time.  Skin: Skin is warm and dry. No rash noted.  Psychiatric: She has a normal mood and affect. Her behavior is normal. Thought content normal.  Vitals reviewed.   Vitals:   10/30/16 1143  BP: 140/80  Pulse: (!) 114  Resp: 17  Temp: 98.4 F (36.9 C)  TempSrc: Oral  SpO2: 100%  Weight: 276 lb (125.2 kg)  Height: 5\' 3"  (1.6 m)   Body mass index is 48.89 kg/m.  EKG: sinus rhythm rate 91, Incomplete RBBB, flat T waves in lateral leads, no acute abnormality.    Assessment & Plan:     ANTOINETTE FINKLER is a 45 y.o. female Annual physical exam  - -anticipatory guidance as below in AVS, screening labs above. Health maintenance items as above in HPI discussed/recommended as applicable.    Abdominal pain, generalized - Plan: Comprehensive metabolic panel, US Pelvis Complete Abdominal bloating - Plan: Comprehensive metabolic panel, US Pelvis Complete Postprandial nausea - Plan: Comprehensive metabolic panel, US Abdomen Complete  - Associated with menses, follow-up as planned with gynecologist in next week. However with upper/generalized abdominal pain and some association with meals/postprandial nausea, will check LFTs as well as abdominal and pelvic ultrasound to evaluate pancreas and gallbladder. Pelvic ultrasound to look into ovarian cysts, or other cause of lower abdominal  symptoms associated with menses.  Class 3 obesity without serious comorbidity with body mass index (BMI) of 45.0 to 49.9 in adult, unspecified obesity type (HCC)  - Diet, exercise discussed. Nutritionist and possible bariatric surgery evaluation also discussed based on BMI.   Iron deficiency anemia, unspecified iron deficiency anemia type - Plan: CBC  -Continue iron daily, consider on empty stomach and vitamin C for improved efficacy. Follow-up with GYN as planned.   Screening for thyroid disorder - Plan: TSH  Tachycardia - Plan: EKG 12-Lead  - Check TSH, few nonspecific findings seen on EKG including incomplete RBBB. Asymptomatic   Recheck in few weeks to discuss lab results above, abdominal pain further. Sooner if worse.   No orders of the defined types were placed in this encounter.  Patient Instructions    Follow up with gynecologist as planned this Monday.  Talk about your bloating and abdominal pain symptoms with menses. I will order an ultrasound of abdomen to look at your gallbladder and other possible causes of bloating. I will also look at your liver tests. Avoid fried or fatty foods for now and recheck in next few weeks. Depending on symptoms at that time, you may need to meet with a  gastroenterologist.   Return to the clinic or go to the nearest emergency room if any of your symptoms worsen or new symptoms occur.  Avoid fried food, exercise most if not all days per week, and recheck to discuss weight in next 6 months. If difficulty with weight loss, would recommend meeting with nutritionist and possible bariatric surgeon/specialist.     Abdominal Pain, Adult Abdominal pain can be caused by many things. Often, abdominal pain is not serious and it gets better with no treatment or by being treated at home. However, sometimes abdominal pain is serious. Your health care provider will do a medical history and a physical exam to try to determine the cause of your abdominal  pain. Follow these instructions at home:  Take over-the-counter and prescription medicines only as told by your health care provider. Do not take a laxative unless told by your health care provider.  Drink enough fluid to keep your urine clear or pale yellow.  Watch your condition for any changes.  Keep all follow-up visits as told by your health care provider. This is important. Contact a health care provider if:  Your abdominal pain changes or gets worse.  You are  not hungry or you lose weight without trying.  You are constipated or have diarrhea for more than 2-3 days.  You have pain when you urinate or have a bowel movement.  Your abdominal pain wakes you up at night.  Your pain gets worse with meals, after eating, or with certain foods.  You are throwing up and cannot keep anything down.  You have a fever. Get help right away if:  Your pain does not go away as soon as your health care provider told you to expect.  You cannot stop throwing up.  Your pain is only in areas of the abdomen, such as the right side or the left lower portion of the abdomen.  You have bloody or black stools, or stools that look like tar.  You have severe pain, cramping, or bloating in your abdomen.  You have signs of dehydration, such as:  Dark urine, very little urine, or no urine.  Cracked lips.  Dry mouth.  Sunken eyes.  Sleepiness.  Weakness. This information is not intended to replace advice given to you by your health care provider. Make sure you discuss any questions you have with your health care provider. Document Released: 08/15/2005 Document Revised: 05/25/2016 Document Reviewed: 04/18/2016 Elsevier Interactive Patient Education  2017 [employer]  Get These Tests 1. Marks Pressure- Have your Marks pressure checked once a year by your health care provider.  Normal Marks pressure is 120/80. 2. Weight- Have your body mass index (BMI)  calculated to screen for obesity.  BMI is measure of body fat based on height and weight.  You can also calculate your own BMI at GravelBags.it. 3. Cholesterol- Have your cholesterol checked every 5 years starting at age 59 then yearly starting at age 66. 29. Chlamydia, HIV, and other sexually transmitted diseases- Get screened every year until age 53, then within three months of each new sexual provider. 5. Pap Test - Every 1-5 years; discuss with your health care provider. 6. Mammogram- Every 1-2 years starting at age 26--50  Take these medicines  Calcium with Vitamin D-Your body needs 1200 mg of Calcium each day and [phone] IU of Vitamin D daily.  Your body can only absorb 500 mg of Calcium at a time so Calcium must be taken in 2 or 3 divided doses throughout the day.  Multivitamin with folic acid- Once daily if it is possible for you to become pregnant.  Get these Immunizations  Gardasil-Series of three doses; prevents HPV related illness such as genital warts and cervical cancer.  Menactra-Single dose; prevents meningitis.  Tetanus shot- Every 10 years.  Flu shot-Every year.  Take these steps 1. Do not smoke-Your healthcare provider can help you quit.  For tips on how to quit go to www.smokefree.gov or call 1-800 QUITNOW. 2. Be physically active- Exercise 5 days a week for at least 30 minutes.  If you are not already physically active, start slow and gradually work up to 30 minutes of moderate physical activity.  Examples of moderate activity include walking briskly, dancing, swimming, bicycling, etc. 3. Breast Cancer- A self breast exam every month is important for early detection of breast cancer.  For more information and instruction on self breast exams, ask your healthcare provider or https://www.patel.info/. 4. Eat a healthy diet- Eat a variety of healthy foods such as fruits, vegetables, whole grains, low fat milk, low fat cheeses, yogurt, lean  meats, poultry and fish, beans, nuts, tofu, etc.  For  more information go to www. Thenutritionsource.org 5. Drink alcohol in moderation- Limit alcohol intake to one drink or less per day. Never drink and drive. 6. Depression- Your emotional health is as important as your physical health.  If you're feeling down or losing interest in things you normally enjoy please talk to your healthcare provider about being screened for depression. 7. Dental visit- Brush and floss your teeth twice daily; visit your dentist twice a year. 8. Eye doctor- Get an eye exam at least every 2 years. 9. Helmet use- Always wear a helmet when riding a bicycle, motorcycle, rollerblading or skateboarding. 20. Safe sex- If you may be exposed to sexually transmitted infections, use a condom. 11. Seat belts- Seat belts can save your live; always wear one. 12. Smoke/Carbon Monoxide detectors- These detectors need to be installed on the appropriate level of your home. Replace batteries at least once a year. 13. Skin cancer- When out in the sun please cover up and use sunscreen 15 SPF or higher. 14. Violence- If anyone is threatening or hurting you, please tell your healthcare provider.         IF you received an x-Marks today, you will receive an invoice from [facility]. Please contact [facility] at [phone] with questions or concerns regarding your invoice.   IF you received labwork today, you will receive an invoice from [employer]. Please contact [facility] at [phone] with questions or concerns regarding your invoice.   Our billing staff will not be able to assist you with questions regarding bills from these companies.  You will be contacted with the lab results as soon as they are available. The fastest way to get your results is to activate your My Chart account. Instructions are located on the last page of this paperwork. If you have not heard from [place] regarding the  results in 2 weeks, please contact this office.     I personally performed the services described in this documentation, which was scribed in my presence. The recorded information has been reviewed and considered, and addended by me as needed.   Signed,   Deborah Ray, MD [facility].  10/30/16 3:28 PM

## 2016-10-31 ENCOUNTER — Telehealth: Payer: Self-pay

## 2016-10-31 ENCOUNTER — Encounter: Payer: Self-pay | Admitting: *Deleted

## 2016-10-31 DIAGNOSIS — R1084 Generalized abdominal pain: Secondary | ICD-10-CM

## 2016-10-31 LAB — CBC
HEMATOCRIT: 30 % — AB (ref 34.0–46.6)
Hemoglobin: 9.9 g/dL — ABNORMAL LOW (ref 11.1–15.9)
MCH: 22.4 pg — AB (ref 26.6–33.0)
MCHC: 33 g/dL (ref 31.5–35.7)
MCV: 68 fL — AB (ref 79–97)
Platelets: 328 10*3/uL (ref 150–379)
RBC: 4.42 x10E6/uL (ref 3.77–5.28)
RDW: 19.3 % — AB (ref 12.3–15.4)
WBC: 4.7 10*3/uL (ref 3.4–10.8)

## 2016-10-31 LAB — COMPREHENSIVE METABOLIC PANEL
A/G RATIO: 1.8 (ref 1.2–2.2)
ALT: 18 IU/L (ref 0–32)
AST: 20 IU/L (ref 0–40)
Albumin: 4.2 g/dL (ref 3.5–5.5)
Alkaline Phosphatase: 61 IU/L (ref 39–117)
BUN/Creatinine Ratio: 13 (ref 9–23)
BUN: 8 mg/dL (ref 6–24)
CALCIUM: 8.9 mg/dL (ref 8.7–10.2)
CO2: 26 mmol/L (ref 18–29)
Chloride: 100 mmol/L (ref 96–106)
Creatinine, Ser: 0.64 mg/dL (ref 0.57–1.00)
GFR, EST AFRICAN AMERICAN: 124 mL/min/{1.73_m2} (ref >59)
GFR, EST NON AFRICAN AMERICAN: 107 mL/min/{1.73_m2} (ref >59)
GLOBULIN, TOTAL: 2.3 g/dL (ref 1.5–4.5)
Glucose: 129 mg/dL — ABNORMAL HIGH (ref 65–99)
POTASSIUM: 3.7 mmol/L (ref 3.5–5.2)
SODIUM: 140 mmol/L (ref 134–144)
TOTAL PROTEIN: 6.5 g/dL (ref 6.0–8.5)

## 2016-10-31 LAB — TSH: TSH: 3.15 u[IU]/mL (ref 0.450–4.500)

## 2016-11-05 DIAGNOSIS — D649 Anemia, unspecified: Secondary | ICD-10-CM | POA: Diagnosis not present

## 2016-11-05 DIAGNOSIS — E669 Obesity, unspecified: Secondary | ICD-10-CM | POA: Diagnosis not present

## 2016-11-05 DIAGNOSIS — Z6841 Body Mass Index (BMI) 40.0 and over, adult: Secondary | ICD-10-CM | POA: Diagnosis not present

## 2016-11-05 DIAGNOSIS — Z1231 Encounter for screening mammogram for malignant neoplasm of breast: Secondary | ICD-10-CM | POA: Diagnosis not present

## 2016-11-05 DIAGNOSIS — Z01419 Encounter for gynecological examination (general) (routine) without abnormal findings: Secondary | ICD-10-CM | POA: Diagnosis not present

## 2016-11-05 DIAGNOSIS — Z01411 Encounter for gynecological examination (general) (routine) with abnormal findings: Secondary | ICD-10-CM | POA: Diagnosis not present

## 2016-11-13 ENCOUNTER — Ambulatory Visit
Admission: RE | Admit: 2016-11-13 | Discharge: 2016-11-13 | Disposition: A | Discharge status: Home / Self Care | Payer: 59 | Source: Ambulatory Visit | Attending: Family Medicine | Admitting: Family Medicine

## 2016-11-13 DIAGNOSIS — R109 Unspecified abdominal pain: Secondary | ICD-10-CM | POA: Diagnosis not present

## 2016-11-13 DIAGNOSIS — R110 Nausea: Secondary | ICD-10-CM

## 2016-11-13 DIAGNOSIS — R140 Abdominal distension (gaseous): Secondary | ICD-10-CM

## 2016-11-13 DIAGNOSIS — D259 Leiomyoma of uterus, unspecified: Secondary | ICD-10-CM | POA: Diagnosis not present

## 2016-11-13 DIAGNOSIS — R1084 Generalized abdominal pain: Secondary | ICD-10-CM

## 2016-12-13 ENCOUNTER — Ambulatory Visit (INDEPENDENT_AMBULATORY_CARE_PROVIDER_SITE_OTHER): Payer: 59 | Admitting: Family Medicine

## 2016-12-13 ENCOUNTER — Encounter: Payer: Self-pay | Admitting: Family Medicine

## 2016-12-13 VITALS — BP 145/86 | HR 84 | Temp 98.3°F | Resp 16 | Ht 63.0 in | Wt 280.0 lb

## 2016-12-13 DIAGNOSIS — D509 Iron deficiency anemia, unspecified: Secondary | ICD-10-CM

## 2016-12-13 DIAGNOSIS — R140 Abdominal distension (gaseous): Secondary | ICD-10-CM

## 2016-12-13 NOTE — Patient Instructions (Addendum)
  If nausea or abdominal pain returns after eating, or bloating symptoms return,  I would recommend meeting with a gastroenterologist as other gallbladder testing or other evaluation of your symptoms. Avoidiung fried or fatty food may also be helpful.   Continue follow up with OBGYN for fibroid.   Continue iron once per day and return for bloodwork only in the next 3-4 weeks to see if blood counts have improved. If not, may need to take more than once per day.    Return to the clinic or go to the nearest emergency room if any of your symptoms worsen or new symptoms occur.    IF you received an x-ray today, you will receive an invoice from [facility]. Please contact [facility] at [phone] with questions or concerns regarding your invoice.   IF you received labwork today, you will receive an invoice from [facility]. Please contact [facility] at [phone] with questions or concerns regarding your invoice.   Our billing staff will not be able to assist you with questions regarding bills from these companies.  You will be contacted with the lab results as soon as they are available. The fastest way to get your results is to activate your My Chart account. Instructions are located on the last page of this paperwork. If you have not heard from [place] regarding the results in 2 weeks, please contact this office.

## 2016-12-13 NOTE — Progress Notes (Signed)
By signing my name below, I, Mesha Guinyard, attest that this documentation has been prepared under the direction and in the presence of [name], MD.  Electronically Signed: Verlee Monte, Medical Scribe. 12/13/16. 6:56 PM.  Subjective:    Patient ID: [name], female    DOB: 18-Sep-1971, 46 y.o.   MRN: [id number]  HPI Chief Complaint  Patient presents with  . Follow-up    from physical    HPI Comments: [name] is a 46 y.o. female who presents to the [facility] for follow-up. She was last seen Dec 12th for physicial and had other concerns at that time including: abdominal pain, and bloating along with postprandial nausea. She was sent for an abdominal and pelvic ultra sound showing an enlarged uterus with leiomyoma, otherwise unremarkable, including nl appearing gallbladder without gallstones. She had a nl CMP except mildly elevated glucose.  Abdmoninal Pain/Bloating: Pt rarely has nausea after eating and mentions it occurs with certain foods. She avoids greasy fatty foods and exercises. Denies experiencing bloating or abdominal pain this month.  Anemia: She is followed by OB/GYN for heavy menses. Prev on iron supplement, did advise her to take it with Vit C at last visit. Slightly lower than prev readings 10 months ago, but similar to 1 year ago. Iron level was prev low at March 2017. Has been taking 1 iron supplement a day for 3 weeks, and admits at her last visit with blood draw she wasn't taking it regularly (she was taking it "once in a while"). Her last visit with OB/GYN was Dec 26th. Pt no longer has heavy menses. Lab Results  Component Value Date   WBC 4.7 10/30/2016   HGB 11.1 (L) 02/13/2016   HCT 30.0 (L) 10/30/2016   MCV 68 (L) 10/30/2016   PLT 328 10/30/2016   There are no active problems to display for this patient.  Past Medical History:  Diagnosis Date  . Anemia   . Headache(784.0)   . PONV (postoperative nausea and vomiting)      Past Surgical History:  Procedure Laterality Date  . DILATION AND CURETTAGE OF UTERUS  2000  . DILITATION & CURRETTAGE/HYSTROSCOPY WITH NOVASURE ABLATION N/A 02/05/2013   Procedure: DILATATION & CURETTAGE/HYSTEROSCOPY WITH NOVASURE ABLATION;  Surgeon: Margarette Asal, MD;  Location: [facility];  Service: Gynecology;  Laterality: N/A;  . LAPAROSCOPIC TUBAL LIGATION Bilateral 02/05/2013   Procedure: LAPAROSCOPIC TUBAL LIGATION;  Surgeon: Margarette Asal, MD;  Location: [facility];  Service: Gynecology;  Laterality: Bilateral;  attempted tubal ligation   No Known Allergies Prior to Admission medications   Medication Sig Start Date End Date Taking? Authorizing Provider  calcium carbonate (OSCAL) 1500 (600 Ca) MG TABS tablet Take 600 mg of elemental calcium by mouth 2 (two) times daily with a meal.   Yes Historical Provider, MD  Iron TABS Take 65 mg by mouth.   Yes Historical Provider, MD  Multiple Vitamins-Minerals (CENTRUM ADULTS) TABS Take by mouth. Reported on 02/13/2016   Yes Historical Provider, MD  norethindrone (AYGESTIN) 5 MG tablet Take by mouth daily.    Historical Provider, MD   Social History   Social History  . Marital status: Married    Spouse name: N/A  . Number of children: N/A  . Years of education: N/A   Occupational History  . Not on file.   Social History Main Topics  . Smoking status: Never Smoker  . Smokeless tobacco: Not on file  . Alcohol  use Yes     Comment: occas  . Drug use: No  . Sexual activity: Not on file   Other Topics Concern  . Not on file   Social History Narrative  . No narrative on file   Review of Systems  Gastrointestinal: Negative for abdominal distention, abdominal pain and nausea.  Genitourinary: Negative for menstrual problem.    Objective:  Physical Exam  Constitutional: She appears well-developed and well-nourished. No distress.  HENT:  Head: Normocephalic and atraumatic.  Eyes: Conjunctivae are normal.  Neck: Neck supple.   Cardiovascular: Normal rate, regular rhythm and normal heart sounds.  Exam reveals no gallop and no friction rub.   No murmur heard. Pulmonary/Chest: Effort normal and breath sounds normal. No respiratory distress. She has no wheezes. She has no rales.  Abdominal: Soft. There is no tenderness. There is negative Murphy's sign.  Neurological: She is alert.  Skin: Skin is warm and dry.  Psychiatric: She has a normal mood and affect. Her behavior is normal.  Nursing note and vitals reviewed.  BP (!) 145/86   Pulse 84   Temp 98.3 F (36.8 C) (Oral)   Resp 16   Ht 5\' 3"  (1.6 m)   Wt 280 lb (127 kg)   LMP 12/12/2016   SpO2 98%   BMI 49.60 kg/m  Assessment & Plan:   [name] is a 46 y.o. female Iron deficiency anemia, unspecified iron deficiency anemia type - Plan: CBC, Iron and TIBC  - Continue supplementation, plan on lab only visit in 3-4 weeks to check iron level and CBC at that time. Continue routine follow-up with OB/GYN.  Abdominal bloating  -Now resolved. Previous imaging reassuring. Consider HIDA scan if persistent postprandial nausea/abdominal pain. rtc precautions.   Meds ordered this encounter  Medications  . calcium carbonate (OSCAL) 1500 (600 Ca) MG TABS tablet    Sig: Take 600 mg of elemental calcium by mouth 2 (two) times daily with a meal.   Patient Instructions    If nausea or abdominal pain returns after eating, or bloating symptoms return,  I would recommend meeting with a gastroenterologist as other gallbladder testing or other evaluation of your symptoms. Avoidiung fried or fatty food may also be helpful.   Continue follow up with OBGYN for fibroid.   Continue iron once per day and return for bloodwork only in the next 3-4 weeks to see if blood counts have improved. If not, may need to take more than once per day.    Return to the clinic or go to the nearest emergency room if any of your symptoms worsen or new symptoms occur.    IF you received an  x-ray today, you will receive an invoice from [facility]. Please contact [facility] at [phone] with questions or concerns regarding your invoice.   IF you received labwork today, you will receive an invoice from [place]. Please contact [facility] at [phone] with questions or concerns regarding your invoice.   Our billing staff will not be able to assist you with questions regarding bills from these companies.  You will be contacted with the lab results as soon as they are available. The fastest way to get your results is to activate your My Chart account. Instructions are located on the last page of this paperwork. If you have not heard from [place] regarding the results in 2 weeks, please contact this office.       I personally performed the services described in this documentation, which  was scribed in my presence. The recorded information has been reviewed and considered, and addended by me as needed.   Signed,   [name], MD [facility].  12/16/16 3:36 PM

## 2017-01-31 ENCOUNTER — Other Ambulatory Visit: Payer: 59

## 2017-01-31 DIAGNOSIS — D509 Iron deficiency anemia, unspecified: Secondary | ICD-10-CM | POA: Diagnosis not present

## 2017-02-01 LAB — CBC
HEMOGLOBIN: 11.5 g/dL (ref 11.1–15.9)
Hematocrit: 34.5 % (ref 34.0–46.6)
MCH: 24.4 pg — ABNORMAL LOW (ref 26.6–33.0)
MCHC: 33.3 g/dL (ref 31.5–35.7)
MCV: 73 fL — ABNORMAL LOW (ref 79–97)
Platelets: 284 10*3/uL (ref 150–379)
RBC: 4.72 x10E6/uL (ref 3.77–5.28)
RDW: 18.3 % — ABNORMAL HIGH (ref 12.3–15.4)
WBC: 6.5 10*3/uL (ref 3.4–10.8)

## 2017-02-01 LAB — IRON AND TIBC
IRON SATURATION: 12 % — AB (ref 15–55)
IRON: 42 ug/dL (ref 27–159)
TIBC: 363 ug/dL (ref 250–450)
UIBC: 321 ug/dL (ref 131–425)

## 2017-05-15 DIAGNOSIS — H5213 Myopia, bilateral: Secondary | ICD-10-CM | POA: Diagnosis not present

## 2017-11-08 ENCOUNTER — Encounter: Payer: Self-pay | Admitting: Family Medicine

## 2017-11-08 ENCOUNTER — Other Ambulatory Visit: Payer: Self-pay

## 2017-11-08 ENCOUNTER — Ambulatory Visit (INDEPENDENT_AMBULATORY_CARE_PROVIDER_SITE_OTHER): Payer: 59 | Admitting: Family Medicine

## 2017-11-08 VITALS — BP 110/70 | HR 89 | Temp 99.0°F | Resp 18 | Ht 62.0 in | Wt 274.0 lb

## 2017-11-08 DIAGNOSIS — R739 Hyperglycemia, unspecified: Secondary | ICD-10-CM

## 2017-11-08 DIAGNOSIS — Z131 Encounter for screening for diabetes mellitus: Secondary | ICD-10-CM | POA: Diagnosis not present

## 2017-11-08 DIAGNOSIS — Z0000 Encounter for general adult medical examination without abnormal findings: Secondary | ICD-10-CM

## 2017-11-08 DIAGNOSIS — Z23 Encounter for immunization: Secondary | ICD-10-CM | POA: Diagnosis not present

## 2017-11-08 DIAGNOSIS — Z6841 Body Mass Index (BMI) 40.0 and over, adult: Secondary | ICD-10-CM | POA: Diagnosis not present

## 2017-11-08 DIAGNOSIS — D509 Iron deficiency anemia, unspecified: Secondary | ICD-10-CM

## 2017-11-08 DIAGNOSIS — E66813 Obesity, class 3: Secondary | ICD-10-CM

## 2017-11-08 DIAGNOSIS — Z114 Encounter for screening for human immunodeficiency virus [HIV]: Secondary | ICD-10-CM

## 2017-11-08 NOTE — Patient Instructions (Addendum)
Please discuss the upper abdominal cramping around the time of your menses with your gynecologist. If other testing is recommended from me, please let me know.   Continue to increase activity to most days per week - goal of 150 minutes per week, and watch portion sizes. Weight has decreased since last year - keep up the good work!   Keeping You Healthy  Get These Tests 1. Blood Pressure- Have your blood pressure checked once a year by your health care provider.  Normal blood pressure is 120/80. 2. Weight- Have your body mass index (BMI) calculated to screen for obesity.  BMI is measure of body fat based on height and weight.  You can also calculate your own BMI at GravelBags.it. 3. Cholesterol- Have your cholesterol checked every 5 years starting at age 31 then yearly starting at age 71. 5. Chlamydia, HIV, and other sexually transmitted diseases- Get screened every year until age 59, then within three months of each new sexual provider. 5. Pap Test - Every 1-5 years; discuss with your health care provider. 6. Mammogram- Every 1-2 years starting at age 34--50  Take these medicines  Calcium with Vitamin D-Your body needs 1200 mg of Calcium each day and [phone] IU of Vitamin D daily.  Your body can only absorb 500 mg of Calcium at a time so Calcium must be taken in 2 or 3 divided doses throughout the day.  Multivitamin with folic acid- Once daily if it is possible for you to become pregnant.  Get these Immunizations  Gardasil-Series of three doses; prevents HPV related illness such as genital warts and cervical cancer.  Menactra-Single dose; prevents meningitis.  Tetanus shot- Every 10 years.  Flu shot-Every year.  Take these steps 1. Do not smoke-Your healthcare provider can help you quit.  For tips on how to quit go to www.smokefree.gov or call 1-800 QUITNOW. 2. Be physically active- Exercise 5 days a week for at least 30 minutes.  If you are not already physically  active, start slow and gradually work up to 30 minutes of moderate physical activity.  Examples of moderate activity include walking briskly, dancing, swimming, bicycling, etc. 3. Breast Cancer- A self breast exam every month is important for early detection of breast cancer.  For more information and instruction on self breast exams, ask your healthcare provider or https://www.patel.info/. 4. Eat a healthy diet- Eat a variety of healthy foods such as fruits, vegetables, whole grains, low fat milk, low fat cheeses, yogurt, lean meats, poultry and fish, beans, nuts, tofu, etc.  For more information go to www. Thenutritionsource.org 5. Drink alcohol in moderation- Limit alcohol intake to one drink or less per day. Never drink and drive. 6. Depression- Your emotional health is as important as your physical health.  If you're feeling down or losing interest in things you normally enjoy please talk to your healthcare provider about being screened for depression. 7. Dental visit- Brush and floss your teeth twice daily; visit your dentist twice a year. 8. Eye doctor- Get an eye exam at least every 2 years. 9. Helmet use- Always wear a helmet when riding a bicycle, motorcycle, rollerblading or skateboarding. 83. Safe sex- If you may be exposed to sexually transmitted infections, use a condom. 11. Seat belts- Seat belts can save your live; always wear one. 12. Smoke/Carbon Monoxide detectors- These detectors need to be installed on the appropriate level of your home. Replace batteries at least once a year. 13. Skin cancer- When out in the sun  please cover up and use sunscreen 15 SPF or higher. 14. Violence- If anyone is threatening or hurting you, please tell your healthcare provider.           IF you received an x-ray today, you will receive an invoice from [facility]. Please contact [facility] at [phone] with questions or concerns regarding your invoice.    IF you received labwork today, you will receive an invoice from [place]. Please contact [facility] at [phone] with questions or concerns regarding your invoice.   Our billing staff will not be able to assist you with questions regarding bills from these companies.  You will be contacted with the lab results as soon as they are available. The fastest way to get your results is to activate your My Chart account. Instructions are located on the last page of this paperwork. If you have not heard from [place] regarding the results in 2 weeks, please contact this office.

## 2017-11-08 NOTE — Progress Notes (Addendum)
Subjective:  By signing my name below, I, [name], attest that this documentation has been prepared under the direction and in the presence of Deborah Agreste, MD Electronically Signed: Ladene Artist, ED Scribe 11/08/2017 at 3:59 PM.   Patient ID: [name], female    DOB: [birth year], 46 y.o.   MRN: [id number]  Chief Complaint  Patient presents with   Annual Exam    having pap with gyno next wednesday    HPI [name] is a 46 y.o. female who presents to Primary Care at [facility] for an annual exam. Last CPE in 10/2016.   Abdominal Pain/Bloating Discussed last yr. H/o of ablation with peri-menstrual pain. Treated with norethindrone tabs but without x 3 months at that time. She had planned f/u with GYN in 1 wk but also had nausea and upper abdominal pain. I order an abdomen and pelvis US that was normal. 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 Recommended discussing further with GYN in f/u.  Pt reports that she is still having intermittent upper abdominal cramping every month around the time of her menstrual period. Pain is unchanged from cramping that she experienced last yr. Denies nausea, vomiting. She plans to discuss this with Dr. Matthew Saras. No diagnosis of endometriosis.  Anemia Iron deficiency previously thought due to heavy menses. Was taking iron supplement but not with Vit C at last visit. December Hgb previously was 9.9, increased recently in March to 11.5. Denies blood in stools, melena, dizziness, lightheadedness. Lab Results  Component Value Date   WBC 6.5 01/31/2017   HGB 11.5 01/31/2017   HCT 34.5 01/31/2017   MCV 73 (L) 01/31/2017   PLT 284 01/31/2017  Normal last yr.  Hyperglycemia Glucose was 129 10/2016. Normal on previous readings. No family h/o DM.  L Knee Pain Pt reports intermittent L knee pain with exercising at times. Also reports pain with prolonged sitting. Denies swelling.  CA Screening Breast CA Screening:  mammogram last yr; normal. Scheduled for next week Cervical CA Screening: OBGYN Dr. Matthew Saras, plan on pap testing next Wed  Immunizations Immunization History  Administered Date(s) Administered   Influenza,inj,Quad PF,6+ Mos 07/20/2016  Flu: 08/2017 Tetanus: today  HIV Screening: today  Depression Screening Depression screen [facility] 2/9 11/08/2017 12/13/2016 10/30/2016 02/13/2016 10/19/2015  Decreased Interest 0 0 0 0 0  Down, Depressed, Hopeless 0 0 0 0 0  PHQ - 2 Score 0 0 0 0 0     Visual Acuity Screening   Right eye Left eye Both eyes  Without correction:     With correction: 20/13-1 20/13-1 20/13   Vision: wears glasses, last seen last yr Dentist: last seen 1 month ago Exercise: walks 10-20 mins/day at work Diet: rarely eats fast food, states she cooks a lot but she does enjoy rice. She does have sodas only when it's the time of her menstrual periods. Pt eats 3 meals/day.  Wt Readings from Last 3 Encounters:  11/08/17 274 lb (124.3 kg)  12/13/16 280 lb (127 kg)  10/30/16 276 lb (125.2 kg)  Body mass index is 50.12 kg/m.  There are no active problems to display for this patient.  Past Medical History:  Diagnosis Date   Anemia    Headache(784.0)    PONV (postoperative nausea and vomiting)    Past Surgical History:  Procedure Laterality Date   DILATION AND CURETTAGE OF UTERUS  2000   DILITATION & CURRETTAGE/HYSTROSCOPY WITH NOVASURE ABLATION N/A 02/05/2013   Procedure:  DILATATION & CURETTAGE/HYSTEROSCOPY WITH NOVASURE ABLATION;  Surgeon: Margarette Asal, MD;  Location: [facility];  Service: Gynecology;  Laterality: N/A;   LAPAROSCOPIC TUBAL LIGATION Bilateral 02/05/2013   Procedure: LAPAROSCOPIC TUBAL LIGATION;  Surgeon: Margarette Asal, MD;  Location: [facility];  Service: Gynecology;  Laterality: Bilateral;  attempted tubal ligation   No Known Allergies Prior to Admission medications   Medication Sig Start Date End Date Taking? Authorizing Provider  ferrous  sulfate (FEROSUL) 325 (65 FE) MG tablet Take 325 mg by mouth daily with breakfast.   Yes [provider]  vitamin C (ASCORBIC ACID) 500 MG tablet Take 500 mg by mouth daily.   Yes [provider]  calcium carbonate (OSCAL) 1500 (600 Ca) MG TABS tablet Take 600 mg of elemental calcium by mouth 2 (two) times daily with a meal.    [provider]  Iron TABS Take 65 mg by mouth.    [provider]  Multiple Vitamins-Minerals (CENTRUM ADULTS) TABS Take by mouth. Reported on 02/13/2016    [provider]  norethindrone (AYGESTIN) 5 MG tablet Take by mouth daily.    [provider]   Social History   Socioeconomic History   Marital status: Married    Spouse name: Not on file   Number of children: Not on file   Years of education: Not on file   Highest education level: Not on file  Social Needs   Financial resource strain: Not on file   Food insecurity - worry: Not on file   Food insecurity - inability: Not on file   Transportation needs - medical: Not on file   Transportation needs - non-medical: Not on file  Occupational History   Not on file  Tobacco Use   Smoking status: Never Smoker   Smokeless tobacco: Never Used  Substance and Sexual Activity   Alcohol use: Yes    Comment: occas   Drug use: No   Sexual activity: Not on file  Other Topics Concern   Not on file  Social History Narrative   Not on file   Review of Systems  Gastrointestinal: Positive for abdominal pain (cramping). Negative for blood in stool, nausea and vomiting.  Musculoskeletal: Positive for arthralgias. Negative for joint swelling.  Neurological: Negative for dizziness and light-headedness.      Objective:   Physical Exam  Constitutional: She is oriented to person, place, and time. She appears well-developed and well-nourished.  HENT:  Head: Normocephalic and atraumatic.  Right Ear: External ear normal.  Left Ear: External ear normal.    Mouth/Throat: Oropharynx is clear and moist.  Eyes: Conjunctivae are normal. Pupils are equal, round, and reactive to light.  Neck: Normal range of motion. Neck supple. No thyromegaly present.  Cardiovascular: Normal rate, regular rhythm, normal heart sounds and intact distal pulses.  No murmur heard. Pulmonary/Chest: Effort normal and breath sounds normal. No respiratory distress. She has no wheezes.  Abdominal: Soft. Bowel sounds are normal. She exhibits no distension. There is no tenderness. There is negative Murphy's sign.  Musculoskeletal: Normal range of motion. She exhibits no edema or tenderness.  Lymphadenopathy:    She has no cervical adenopathy.  Neurological: She is alert and oriented to person, place, and time.  Skin: Skin is warm and dry. No rash noted.  Psychiatric: She has a normal mood and affect. Her behavior is normal. Thought content normal.   Vitals:   11/08/17 1513  BP: 110/70  Pulse: 89  Resp: 18  Temp:  99 F (37.2 C)  TempSrc: Oral  SpO2: 98%  Weight: 274 lb (124.3 kg)  Height: 5\' 2"  (1.575 m)      Assessment & Plan:  CHELBIE JARNAGIN is a 46 y.o. female Annual physical exam  - -anticipatory guidance as below in AVS, screening labs above. Health maintenance items as above in HPI discussed/recommended as applicable.   Iron deficiency anemia, unspecified iron deficiency anemia type - Plan: CBC, Iron  - Check CBC, iron levels, continue supplementation.  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 Option of  nutritionist or bariatric specialist if needed in the future.  No orders of the defined types were placed in this encounter.  Patient Instructions   Please discuss the upper abdominal cramping around the time of your menses with your gynecologist. If other testing is recommended from me, please let me know.   Continue to increase activity to most days per week - goal of 150 minutes per week, and watch portion sizes. Weight has decreased since last year - keep up the good work!   Keeping You Healthy  Get These Tests 1. Blood Pressure- Have your blood pressure checked once a year by your health care provider.  Normal blood pressure is 120/80. 2. Weight- Have your body mass index (BMI) calculated to screen for obesity.  BMI is measure of body fat based on height and weight.  You can also calculate your own BMI at [employer]velBags.it. 3. Cholesterol- Have your cholesterol checked every 5 years starting at age 72 then yearly starting at age 4. 7. Chlamydia, HIV, and other sexually transmitted diseases- Get screened every year until age 50, then within three months of each new sexual provider. 5. Pap Test - Every 1-5 years; discuss with your health care provider. 6. Mammogram- Every 1-2 years starting at age 22--50  Take these medicines  Calcium with Vitamin D-Your body needs 1200 mg of Calcium each day and [phone] IU of Vitamin D daily.  Your body can only absorb 500 mg of Calcium at a time so Calcium must be taken in 2 or 3 divided doses throughout the day.  Multivitamin with folic acid- Once daily if it is possible for you to become pregnant.  Get these Immunizations  Gardasil-Series of three doses; prevents HPV related illness such as genital warts and cervical cancer.  Menactra-Single dose; prevents meningitis.  Tetanus shot- Every 10 years.  Flu shot-Every year.  Take these steps 1. Do not smoke-Your healthcare provider can help you quit.  For tips on how to quit go to  www.smokefree.gov or call 1-800 QUITNOW. 2. Be physically active- Exercise 5 days a week for at least 30 minutes.  If you are not already physically active, start slow and gradually work up to 30 minutes of moderate physical activity.  Examples of moderate activity include walking briskly, dancing, swimming, bicycling, etc. 3. Breast Cancer- A self breast exam every month is important for early detection of breast cancer.  For more information and instruction on self breast exams, ask your healthcare provider or https://www.patel.info/. 4. Eat  a healthy diet- Eat a variety of healthy foods such as fruits, vegetables, whole grains, low fat milk, low fat cheeses, yogurt, lean meats, poultry and fish, beans, nuts, tofu, etc.  For more information go to www. Thenutritionsource.org 5. Drink alcohol in moderation- Limit alcohol intake to one drink or less per day. Never drink and drive. 6. Depression- Your emotional health is as important as your physical health.  If you're feeling down or losing interest in things you normally enjoy please talk to your healthcare provider about being screened for depression. 7. Dental visit- Brush and floss your teeth twice daily; visit your dentist twice a year. 8. Eye doctor- Get an eye exam at least every 2 years. 9. Helmet use- Always wear a helmet when riding a bicycle, motorcycle, rollerblading or skateboarding. 1. Safe sex- If you may be exposed to sexually transmitted infections, use a condom. 11. Seat belts- Seat belts can save your live; always wear one. 12. Smoke/Carbon Monoxide detectors- These detectors need to be installed on the appropriate level of your home. Replace batteries at least once a year. 13. Skin cancer- When out in the sun please cover up and use sunscreen 15 SPF or higher. 14. Violence- If anyone is threatening or hurting you, please tell your healthcare provider.           IF you received an x-ray today, you  will receive an invoice from [facility]. Please contact [facility] at [phone] with questions or concerns regarding your invoice.   IF you received labwork today, you will receive an invoice from [place]. Please contact [facility] at [phone] with questions or concerns regarding your invoice.   Our billing staff will not be able to assist you with questions regarding bills from these companies.  You will be contacted with the lab results as soon as they are available. The fastest way to get your results is to activate your My Chart account. Instructions are located on the last page of this paperwork. If you have not heard from [place] regarding the results in 2 weeks, please contact this office.      I personally performed the services described in this documentation, which was scribed in my presence. The recorded information has been reviewed and considered for accuracy and completeness, addended by me as needed, and agree with information above.  Signed,   [name], MD Primary [facility] at [place].  11/08/17 4:41 PM

## 2017-11-09 LAB — HEMOGLOBIN A1C
ESTIMATED AVERAGE GLUCOSE: 111 mg/dL
Hgb A1c MFr Bld: 5.5 % (ref 4.8–5.6)

## 2017-11-09 LAB — COMPREHENSIVE METABOLIC PANEL
ALBUMIN: 4.1 g/dL (ref 3.5–5.5)
ALT: 12 IU/L (ref 0–32)
AST: 14 IU/L (ref 0–40)
Albumin/Globulin Ratio: 1.6 (ref 1.2–2.2)
Alkaline Phosphatase: 65 IU/L (ref 39–117)
BILIRUBIN TOTAL: 0.3 mg/dL (ref 0.0–1.2)
BUN / CREAT RATIO: 13 (ref 9–23)
BUN: 8 mg/dL (ref 6–24)
CALCIUM: 9.2 mg/dL (ref 8.7–10.2)
CHLORIDE: 102 mmol/L (ref 96–106)
CO2: 24 mmol/L (ref 20–29)
CREATININE: 0.64 mg/dL (ref 0.57–1.00)
GFR, EST AFRICAN AMERICAN: 123 mL/min/{1.73_m2} (ref >59)
GFR, EST NON AFRICAN AMERICAN: 107 mL/min/{1.73_m2} (ref >59)
GLUCOSE: 91 mg/dL (ref 65–99)
Globulin, Total: 2.6 g/dL (ref 1.5–4.5)
Potassium: 5.2 mmol/L (ref 3.5–5.2)
Sodium: 140 mmol/L (ref 134–144)
TOTAL PROTEIN: 6.7 g/dL (ref 6.0–8.5)

## 2017-11-09 LAB — CBC
HEMATOCRIT: 37 % (ref 34.0–46.6)
HEMOGLOBIN: 12.5 g/dL (ref 11.1–15.9)
MCH: 24.7 pg — AB (ref 26.6–33.0)
MCHC: 33.8 g/dL (ref 31.5–35.7)
MCV: 73 fL — AB (ref 79–97)
Platelets: 290 10*3/uL (ref 150–379)
RBC: 5.07 x10E6/uL (ref 3.77–5.28)
RDW: 17.7 % — AB (ref 12.3–15.4)
WBC: 4.7 10*3/uL (ref 3.4–10.8)

## 2017-11-09 LAB — HIV ANTIBODY (ROUTINE TESTING W REFLEX): HIV Screen 4th Generation wRfx: NONREACTIVE

## 2017-11-09 LAB — IRON: Iron: 73 ug/dL (ref 27–159)

## 2017-11-13 DIAGNOSIS — Z01419 Encounter for gynecological examination (general) (routine) without abnormal findings: Secondary | ICD-10-CM | POA: Diagnosis not present

## 2017-11-13 DIAGNOSIS — Z6841 Body Mass Index (BMI) 40.0 and over, adult: Secondary | ICD-10-CM | POA: Diagnosis not present

## 2017-11-13 DIAGNOSIS — Z1231 Encounter for screening mammogram for malignant neoplasm of breast: Secondary | ICD-10-CM | POA: Diagnosis not present

## 2017-11-13 DIAGNOSIS — Z3202 Encounter for pregnancy test, result negative: Secondary | ICD-10-CM | POA: Diagnosis not present

## 2017-11-13 MED FILL — IBUPROFEN 800 MG TABS: 800 | 22 days supply | Qty: 90 | Fill #0

## 2017-11-26 MED FILL — HYDROCODON-APAP 5-325: 5-325 | 20 days supply | Qty: 30 | Fill #0

## 2018-02-19 DIAGNOSIS — H52223 Regular astigmatism, bilateral: Secondary | ICD-10-CM | POA: Diagnosis not present

## 2018-02-19 DIAGNOSIS — H5203 Hypermetropia, bilateral: Secondary | ICD-10-CM | POA: Diagnosis not present

## 2018-02-19 DIAGNOSIS — H524 Presbyopia: Secondary | ICD-10-CM | POA: Diagnosis not present

## 2018-04-28 ENCOUNTER — Encounter: Payer: Self-pay | Admitting: Family Medicine

## 2018-08-29 DIAGNOSIS — R102 Pelvic and perineal pain: Secondary | ICD-10-CM | POA: Diagnosis not present

## 2018-09-11 DIAGNOSIS — R102 Pelvic and perineal pain: Secondary | ICD-10-CM | POA: Diagnosis not present

## 2018-12-23 ENCOUNTER — Encounter: Payer: 59 | Admitting: Family Medicine

## 2019-01-21 ENCOUNTER — Ambulatory Visit (INDEPENDENT_AMBULATORY_CARE_PROVIDER_SITE_OTHER): Payer: 59 | Admitting: Family Medicine

## 2019-01-21 ENCOUNTER — Other Ambulatory Visit: Payer: Self-pay

## 2019-01-21 ENCOUNTER — Encounter: Payer: Self-pay | Admitting: Family Medicine

## 2019-01-21 VITALS — BP 138/80 | HR 79 | Temp 98.2°F | Resp 14 | Ht 62.0 in | Wt 269.0 lb

## 2019-01-21 DIAGNOSIS — Z130 Encounter for screening for diseases of the blood and blood-forming organs and certain disorders involving the immune mechanism: Secondary | ICD-10-CM

## 2019-01-21 DIAGNOSIS — I10 Essential (primary) hypertension: Secondary | ICD-10-CM

## 2019-01-21 DIAGNOSIS — E785 Hyperlipidemia, unspecified: Secondary | ICD-10-CM

## 2019-01-21 DIAGNOSIS — Z131 Encounter for screening for diabetes mellitus: Secondary | ICD-10-CM | POA: Diagnosis not present

## 2019-01-21 DIAGNOSIS — Z0001 Encounter for general adult medical examination with abnormal findings: Secondary | ICD-10-CM | POA: Diagnosis not present

## 2019-01-21 DIAGNOSIS — Z0000 Encounter for general adult medical examination without abnormal findings: Secondary | ICD-10-CM

## 2019-01-21 NOTE — Progress Notes (Signed)
Subjective:    Patient ID: [name], female    DOB: 06/29/1971, 48 y.o.   MRN: [id number]  HPI [name] is a 48 y.o. female Presents today for: Chief Complaint  Patient presents with  . Annual Exam    last physical 11/08/2017   History of anemia: On MVI qd. No further iron pills. Told iron was ok by GYN Dr. Matthew Saras - 5 months ago.   Lab Results  Component Value Date   WBC 4.7 11/08/2017   HGB 12.5 11/08/2017   HCT 37.0 11/08/2017   MCV 73 (L) 11/08/2017   PLT 290 11/08/2017   Cancer screening: appt next week with Dr. Matthew Saras for pap testing and mammogram.  Pap 10/22/18 Mammogram 11/08/18.    Immunization History  Administered Date(s) Administered  . Influenza,inj,Quad PF,6+ Mos 07/20/2016  . Tdap 11/08/2017  flu vaccine around October last year.   Depression screen [facility] 2/9 01/21/2019 11/08/2017 12/13/2016 10/30/2016 02/13/2016  Decreased Interest 0 0 0 0 0  Down, Depressed, Hopeless 0 0 0 0 0  PHQ - 2 Score 0 0 0 0 0    Dental: every 6 months, appt next month.   Exercise/obesity: 1-2 days per week exercise.  Wt Readings from Last 3 Encounters:  01/21/19 269 lb (122 kg)  11/08/17 274 lb (124.3 kg)  12/13/16 280 lb (127 kg)   Body mass index is 49.2 kg/m.  Her goal is less than 200 lb.    Monogamous with husband - 23 years, declines STI testing.   There are no active problems to display for this patient.  Past Medical History:  Diagnosis Date  . Anemia   . Headache(784.0)   . PONV (postoperative nausea and vomiting)    Past Surgical History:  Procedure Laterality Date  . DILATION AND CURETTAGE OF UTERUS  2000  . DILITATION & CURRETTAGE/HYSTROSCOPY WITH NOVASURE ABLATION N/A 02/05/2013   Procedure: DILATATION & CURETTAGE/HYSTEROSCOPY WITH NOVASURE ABLATION;  Surgeon: Margarette Asal, MD;  Location: [facility];  Service: Gynecology;  Laterality: N/A;  . LAPAROSCOPIC TUBAL LIGATION Bilateral 02/05/2013   Procedure: LAPAROSCOPIC TUBAL LIGATION;   Surgeon: Margarette Asal, MD;  Location: [facility];  Service: Gynecology;  Laterality: Bilateral;  attempted tubal ligation   No Known Allergies Prior to Admission medications   Medication Sig Start Date End Date Taking? Authorizing Provider  Multiple Vitamins-Minerals (CENTRUM ADULTS) TABS Take by mouth. Reported on 02/13/2016   Yes [provider]   Social History   Socioeconomic History  . Marital status: Married    Spouse name: Not on file  . Number of children: Not on file  . Years of education: Not on file  . Highest education level: Not on file  Occupational History  . Not on file  Social Needs  . Financial resource strain: Not on file  . Food insecurity:    Worry: Not on file    Inability: Not on file  . Transportation needs:    Medical: Not on file    Non-medical: Not on file  Tobacco Use  . Smoking status: Never Smoker  . Smokeless tobacco: Never Used  Substance and Sexual Activity  . Alcohol use: Yes    Comment: occas  . Drug use: No  . Sexual activity: Not on file  Lifestyle  . Physical activity:    Days per week: Not on file    Minutes per session: Not on file  . Stress: Not on file  Relationships  .  Social connections:    Talks on phone: Not on file    Gets together: Not on file    Attends religious service: Not on file    Active member of club or organization: Not on file    Attends meetings of clubs or organizations: Not on file    Relationship status: Not on file  . Intimate partner violence:    Fear of current or ex partner: Not on file    Emotionally abused: Not on file    Physically abused: Not on file    Forced sexual activity: Not on file  Other Topics Concern  . Not on file  Social History Narrative  . Not on file    Review of Systems 13 point review of systems per patient health survey noted.  Negative other than as indicated above or in HPI.      Objective:   Physical Exam Constitutional:      Appearance: She is  well-developed. She is obese.  HENT:     Head: Normocephalic and atraumatic.     Right Ear: External ear normal.     Left Ear: External ear normal.  Eyes:     Conjunctiva/sclera: Conjunctivae normal.     Pupils: Pupils are equal, round, and reactive to light.  Neck:     Musculoskeletal: Normal range of motion and neck supple.     Thyroid: No thyromegaly.  Cardiovascular:     Rate and Rhythm: Normal rate and regular rhythm.     Heart sounds: Normal heart sounds. No murmur.  Pulmonary:     Effort: Pulmonary effort is normal. No respiratory distress.     Breath sounds: Normal breath sounds. No wheezing.  Abdominal:     General: Bowel sounds are normal.     Palpations: Abdomen is soft.     Tenderness: There is no abdominal tenderness.  Musculoskeletal: Normal range of motion.        General: No tenderness.  Lymphadenopathy:     Cervical: No cervical adenopathy.  Skin:    General: Skin is warm and dry.     Findings: No rash.  Neurological:     Mental Status: She is alert and oriented to person, place, and time.  Psychiatric:        Behavior: Behavior normal.        Thought Content: Thought content normal.    Vitals:   01/21/19 0927  BP: 138/80  Pulse: 79  Resp: 14  Temp: 98.2 F (36.8 C)  TempSrc: Oral  SpO2: 100%  Weight: 269 lb (122 kg)  Height: 5\' 2"  (1.575 m)          Assessment & Plan:  [name] is a 48 y.o. female Annual physical exam  - -anticipatory guidance as below in AVS, screening labs above. Health maintenance items as above in HPI discussed/recommended as applicable.   Essential hypertension, benign - Plan: Comprehensive metabolic panel  -Borderline.  Check labs.  Plan for further work on diet and exercise for weight loss.  No new meds at this time.  Hyperlipidemia, unspecified hyperlipidemia type - Plan: Lipid Panel  -Check labs, but as above plan for lifestyle modification.   Screening, anemia, deficiency, iron - Plan: CBC  Screening  for diabetes mellitus - Plan: Hemoglobin A1c   No orders of the defined types were placed in this encounter.  Patient Instructions   I will check some screening labs.   [facility]  Address: [place], [place]  [address]  [email or web address]  Phone: [phone]   [facility] [address] [facility] [place]  Address: [address] [place], [place] [address] Phone: [phone]   Increase exercise to most days per week, goal 150 minutes minimum per week. Here is number for weight loss specialist.  Dennard Nip, MD Medical Weight Loss Management  . [phone]  Keeping You Healthy  Get These Tests 1. Blood Pressure- Have your blood pressure checked once a year by your health care provider.  Normal blood pressure is 120/80. 2. Weight- Have your body mass index (BMI) calculated to screen for obesity.  BMI is measure of body fat based on height and weight.  You can also calculate your own BMI at GravelBags.it. 3. Cholesterol- Have your cholesterol checked every 5 years starting at age 29 then yearly starting at age 85. 77. Chlamydia, HIV, and other sexually transmitted diseases- Get screened every year until age 83, then within three months of each new sexual provider. 5. Pap Test - Every 1-5 years; discuss with your health care provider. 6. Mammogram- Every 1-2 years starting at age 78--50  Take these medicines  Calcium with Vitamin D-Your body needs 1200 mg of Calcium each day and [phone] IU of Vitamin D daily.  Your body can only absorb 500 mg of Calcium at a time so Calcium must be taken in 2 or 3 divided doses throughout the day.  Multivitamin with folic acid- Once daily if it is possible for you to become pregnant.  Get these Immunizations  Gardasil-Series of three doses; prevents HPV related illness such as genital warts and cervical cancer.  Menactra-Single dose; prevents meningitis.  Tetanus shot- Every 10 years.  Flu shot-Every  year.  Take these steps 1. Do not smoke-Your healthcare provider can help you quit.  For tips on how to quit go to www.smokefree.gov or call 1-800 QUITNOW. 2. Be physically active- Exercise 5 days a week for at least 30 minutes.  If you are not already physically active, start slow and gradually work up to 30 minutes of moderate physical activity.  Examples of moderate activity include walking briskly, dancing, swimming, bicycling, etc. 3. Breast Cancer- A self breast exam every month is important for early detection of breast cancer.  For more information and instruction on self breast exams, ask your healthcare provider or https://www.patel.info/. 4. Eat a healthy diet- Eat a variety of healthy foods such as fruits, vegetables, whole grains, low fat milk, low fat cheeses, yogurt, lean meats, poultry and fish, beans, nuts, tofu, etc.  For more information go to www. Thenutritionsource.org 5. Drink alcohol in moderation- Limit alcohol intake to one drink or less per day. Never drink and drive. 6. Depression- Your emotional health is as important as your physical health.  If you're feeling down or losing interest in things you normally enjoy please talk to your healthcare provider about being screened for depression. 7. Dental visit- Brush and floss your teeth twice daily; visit your dentist twice a year. 8. Eye doctor- Get an eye exam at least every 2 years. 9. Helmet use- Always wear a helmet when riding a bicycle, motorcycle, rollerblading or skateboarding. 82. Safe sex- If you may be exposed to sexually transmitted infections, use a condom. 11. Seat belts- Seat belts can save your live; always wear one. 12. Smoke/Carbon Monoxide detectors- These detectors need to be installed on the appropriate level of your home. Replace batteries at least once a year. 13. Skin cancer- When out in the sun please cover up  and use sunscreen 15 SPF or higher. 14. Violence- If anyone is  threatening or hurting you, please tell your healthcare provider.         If you have lab work done today you will be contacted with your lab results within the next 2 weeks.  If you have not heard from [place] then please contact us. The fastest way to get your results is to register for My Chart.   IF you received an x-ray today, you will receive an invoice from [facility]. Please contact [facility] at [phone] with questions or concerns regarding your invoice.   IF you received labwork today, you will receive an invoice from [facility]. Please contact [facility] at [phone] with questions or concerns regarding your invoice.   Our billing staff will not be able to assist you with questions regarding bills from these companies.  You will be contacted with the lab results as soon as they are available. The fastest way to get your results is to activate your My Chart account. Instructions are located on the last page of this paperwork. If you have not heard from [place] regarding the results in 2 weeks, please contact this office.       Signed,   [name], MD [facility].  01/24/19 12:28 PM

## 2019-01-21 NOTE — Patient Instructions (Addendum)
I will check some screening labs.   [facility]  Address: [place], [place], [place] [address]  [email or web address]  Phone: [phone]   [facility] [place] [facility] [place]  Address: [address], [place], [place] [address] Phone: [phone]   Increase exercise to most days per week, goal 150 minutes minimum per week. Here is number for weight loss specialist.  Dennard Nip, MD Medical Weight Loss Management  . [phone]  Keeping You Healthy  Get These Tests 1. Blood Pressure- Have your blood pressure checked once a year by your health care provider.  Normal blood pressure is 120/80. 2. Weight- Have your body mass index (BMI) calculated to screen for obesity.  BMI is measure of body fat based on height and weight.  You can also calculate your own BMI at [employer]velBags.it. 3. Cholesterol- Have your cholesterol checked every 5 years starting at age 43 then yearly starting at age 76. 67. Chlamydia, HIV, and other sexually transmitted diseases- Get screened every year until age 55, then within three months of each new sexual provider. 5. Pap Test - Every 1-5 years; discuss with your health care provider. 6. Mammogram- Every 1-2 years starting at age 32--50  Take these medicines  Calcium with Vitamin D-Your body needs 1200 mg of Calcium each day and [phone] IU of Vitamin D daily.  Your body can only absorb 500 mg of Calcium at a time so Calcium must be taken in 2 or 3 divided doses throughout the day.  Multivitamin with folic acid- Once daily if it is possible for you to become pregnant.  Get these Immunizations  Gardasil-Series of three doses; prevents HPV related illness such as genital warts and cervical cancer.  Menactra-Single dose; prevents meningitis.  Tetanus shot- Every 10 years.  Flu shot-Every year.  Take these steps 1. Do not smoke-Your healthcare provider can help you quit.  For tips on how to quit go to www.smokefree.gov or call  1-800 QUITNOW. 2. Be physically active- Exercise 5 days a week for at least 30 minutes.  If you are not already physically active, start slow and gradually work up to 30 minutes of moderate physical activity.  Examples of moderate activity include walking briskly, dancing, swimming, bicycling, etc. 3. Breast Cancer- A self breast exam every month is important for early detection of breast cancer.  For more information and instruction on self breast exams, ask your healthcare provider or https://www.patel.info/. 4. Eat a healthy diet- Eat a variety of healthy foods such as fruits, vegetables, whole grains, low fat milk, low fat cheeses, yogurt, lean meats, poultry and fish, beans, nuts, tofu, etc.  For more information go to www. Thenutritionsource.org 5. Drink alcohol in moderation- Limit alcohol intake to one drink or less per day. Never drink and drive. 6. Depression- Your emotional health is as important as your physical health.  If you're feeling down or losing interest in things you normally enjoy please talk to your healthcare provider about being screened for depression. 7. Dental visit- Brush and floss your teeth twice daily; visit your dentist twice a year. 8. Eye doctor- Get an eye exam at least every 2 years. 9. Helmet use- Always wear a helmet when riding a bicycle, motorcycle, rollerblading or skateboarding. 56. Safe sex- If you may be exposed to sexually transmitted infections, use a condom. 11. Seat belts- Seat belts can save your live; always wear one. 12. Smoke/Carbon Monoxide detectors- These detectors need to be installed on the appropriate level of your home.  Replace batteries at least once a year. 13. Skin cancer- When out in the sun please cover up and use sunscreen 15 SPF or higher. 14. Violence- If anyone is threatening or hurting you, please tell your healthcare provider.         If you have lab work done today you will be contacted with your lab  results within the next 2 weeks.  If you have not heard from [place] then please contact us. The fastest way to get your results is to register for My Chart.   IF you received an x-ray today, you will receive an invoice from [facility]. Please contact [facility] at [phone] with questions or concerns regarding your invoice.   IF you received labwork today, you will receive an invoice from [place]. Please contact [facility] at [phone] with questions or concerns regarding your invoice.   Our billing staff will not be able to assist you with questions regarding bills from these companies.  You will be contacted with the lab results as soon as they are available. The fastest way to get your results is to activate your My Chart account. Instructions are located on the last page of this paperwork. If you have not heard from [place] regarding the results in 2 weeks, please contact this office.

## 2019-01-22 LAB — LIPID PANEL
CHOL/HDL RATIO: 3.4 ratio (ref 0.0–4.4)
Cholesterol, Total: 140 mg/dL (ref 100–199)
HDL: 41 mg/dL (ref >39)
LDL CALC: 78 mg/dL (ref 0–99)
Triglycerides: 105 mg/dL (ref 0–149)
VLDL Cholesterol Cal: 21 mg/dL (ref 5–40)

## 2019-01-22 LAB — COMPREHENSIVE METABOLIC PANEL
A/G RATIO: 1.8 (ref 1.2–2.2)
ALK PHOS: 65 IU/L (ref 39–117)
ALT: 17 IU/L (ref 0–32)
AST: 14 IU/L (ref 0–40)
Albumin: 4.4 g/dL (ref 3.8–4.8)
BILIRUBIN TOTAL: 0.3 mg/dL (ref 0.0–1.2)
BUN/Creatinine Ratio: 9 (ref 9–23)
BUN: 7 mg/dL (ref 6–24)
CHLORIDE: 101 mmol/L (ref 96–106)
CO2: 24 mmol/L (ref 20–29)
Calcium: 9.3 mg/dL (ref 8.7–10.2)
Creatinine, Ser: 0.74 mg/dL (ref 0.57–1.00)
GFR calc Af Amer: 111 mL/min/{1.73_m2} (ref >59)
GFR calc non Af Amer: 96 mL/min/{1.73_m2} (ref >59)
GLUCOSE: 97 mg/dL (ref 65–99)
Globulin, Total: 2.5 g/dL (ref 1.5–4.5)
POTASSIUM: 4.3 mmol/L (ref 3.5–5.2)
Sodium: 139 mmol/L (ref 134–144)
TOTAL PROTEIN: 6.9 g/dL (ref 6.0–8.5)

## 2019-01-22 LAB — CBC
Hematocrit: 38.8 % (ref 34.0–46.6)
Hemoglobin: 12.9 g/dL (ref 11.1–15.9)
MCH: 25 pg — ABNORMAL LOW (ref 26.6–33.0)
MCHC: 33.2 g/dL (ref 31.5–35.7)
MCV: 75 fL — ABNORMAL LOW (ref 79–97)
PLATELETS: 272 10*3/uL (ref 150–450)
RBC: 5.16 x10E6/uL (ref 3.77–5.28)
RDW: 17.2 % — ABNORMAL HIGH (ref 11.7–15.4)
WBC: 4.5 10*3/uL (ref 3.4–10.8)

## 2019-01-22 LAB — HEMOGLOBIN A1C
ESTIMATED AVERAGE GLUCOSE: 108 mg/dL
HEMOGLOBIN A1C: 5.4 % (ref 4.8–5.6)

## 2019-01-28 DIAGNOSIS — Z6841 Body Mass Index (BMI) 40.0 and over, adult: Secondary | ICD-10-CM | POA: Diagnosis not present

## 2019-01-28 DIAGNOSIS — Z01419 Encounter for gynecological examination (general) (routine) without abnormal findings: Secondary | ICD-10-CM | POA: Diagnosis not present

## 2019-01-28 DIAGNOSIS — Z1231 Encounter for screening mammogram for malignant neoplasm of breast: Secondary | ICD-10-CM | POA: Diagnosis not present

## 2019-01-28 MED FILL — IBUPROFEN 800 MG TABS: 800 | 10 days supply | Qty: 30 | Fill #0

## 2019-01-28 MED FILL — HYDROCODON-APAP 5-325: 5-325 | 8 days supply | Qty: 30 | Fill #0

## 2019-04-17 DIAGNOSIS — H5203 Hypermetropia, bilateral: Secondary | ICD-10-CM | POA: Diagnosis not present

## 2019-04-17 DIAGNOSIS — H52223 Regular astigmatism, bilateral: Secondary | ICD-10-CM | POA: Diagnosis not present

## 2019-04-17 DIAGNOSIS — H524 Presbyopia: Secondary | ICD-10-CM | POA: Diagnosis not present

## 2020-02-15 DIAGNOSIS — Z01419 Encounter for gynecological examination (general) (routine) without abnormal findings: Secondary | ICD-10-CM | POA: Diagnosis not present

## 2020-02-15 DIAGNOSIS — N951 Menopausal and female climacteric states: Secondary | ICD-10-CM | POA: Diagnosis not present

## 2020-02-15 DIAGNOSIS — Z1231 Encounter for screening mammogram for malignant neoplasm of breast: Secondary | ICD-10-CM | POA: Diagnosis not present

## 2020-02-15 DIAGNOSIS — Z6841 Body Mass Index (BMI) 40.0 and over, adult: Secondary | ICD-10-CM | POA: Diagnosis not present

## 2020-02-29 ENCOUNTER — Ambulatory Visit (INDEPENDENT_AMBULATORY_CARE_PROVIDER_SITE_OTHER): Payer: 59 | Admitting: Family Medicine

## 2020-02-29 ENCOUNTER — Encounter: Payer: Self-pay | Admitting: Family Medicine

## 2020-02-29 ENCOUNTER — Other Ambulatory Visit: Payer: Self-pay

## 2020-02-29 VITALS — BP 136/84 | HR 95 | Temp 98.7°F | Ht 62.0 in | Wt 282.0 lb

## 2020-02-29 DIAGNOSIS — Z1211 Encounter for screening for malignant neoplasm of colon: Secondary | ICD-10-CM

## 2020-02-29 DIAGNOSIS — Z131 Encounter for screening for diabetes mellitus: Secondary | ICD-10-CM

## 2020-02-29 DIAGNOSIS — Z0001 Encounter for general adult medical examination with abnormal findings: Secondary | ICD-10-CM | POA: Diagnosis not present

## 2020-02-29 DIAGNOSIS — Z13220 Encounter for screening for lipoid disorders: Secondary | ICD-10-CM | POA: Diagnosis not present

## 2020-02-29 DIAGNOSIS — Z6841 Body Mass Index (BMI) 40.0 and over, adult: Secondary | ICD-10-CM | POA: Diagnosis not present

## 2020-02-29 DIAGNOSIS — J309 Allergic rhinitis, unspecified: Secondary | ICD-10-CM | POA: Diagnosis not present

## 2020-02-29 DIAGNOSIS — Z0000 Encounter for general adult medical examination without abnormal findings: Secondary | ICD-10-CM

## 2020-02-29 MED ORDER — FLUTICASONE PROPIONATE 50 MCG/ACT NA SUSP: 1.0000 | Freq: Every day | NASAL | 6 refills | Status: DC

## 2020-02-29 MED FILL — FLUTICASONE PROP 50 MCG SPR: 50 | 30 days supply | Qty: 16 | Fill #0

## 2020-02-29 NOTE — Progress Notes (Signed)
Subjective:  Patient ID: [name], female    DOB: Nov 11, 1971  Age: 49 y.o. MRN: [id number]  CC:  Chief Complaint  Patient presents with  . Annual Exam    pt states she feel well today.pt is being bothered by seasonal allergies pt is suffering from itchy eyes and throat.     HPI [name] presents for   Annual physical exam.  Allergic rhinitis: Sneezing, itchy eyes and throat.  Benadryl - makes tired. Has used nasal spray with good relief in past.   Hx of anemia - iron ok at recent obgyn visit.  Taking MVI QD.   Cancer screening: No FH of colon CA. Plans on colonoscopy.  Followed by OB/GYN, Dr. Matthew Saras. Cervical cancer screening, Pap: Previous 10/22/2018, pap test few weeks ago with Dr. Matthew Saras.  Breast, mammogram 11/08/2018 - had one few weeks ago.   Immunization History  Administered Date(s) Administered  . Influenza,inj,Quad PF,6+ Mos 07/20/2016  . Tdap 11/08/2017  COVID-19 vaccine: had both through hospital.   Depression screen [facility] 2/9 02/29/2020 01/21/2019 11/08/2017 12/13/2016 10/30/2016  Decreased Interest 0 0 0 0 0  Down, Depressed, Hopeless 0 0 0 0 0  PHQ - 2 Score 0 0 0 0 0    Hearing Screening   125Hz  250Hz  500Hz  1000Hz  2000Hz  3000Hz  4000Hz  6000Hz  8000Hz   Right ear:           Left ear:             Visual Acuity Screening   Right eye Left eye Both eyes  Without correction: 20/20 20/25 20/20   With correction:     plans on optho visit.   Dental: every 6 months.   Exercise/obesity: Body mass index is 51.58 kg/m.   Wt Readings from Last 3 Encounters:  02/29/20 282 lb (127.9 kg)  01/21/19 269 lb (122 kg)  11/08/17 274 lb (124.3 kg)  Going to gym 3 times per week, 20-30 minutes.  Only occasional soda, no sweet tea.  Phone number for weight mgt last year - closed, but plans to call to meet with them.     History There are no problems to display for this patient.  Past Medical History:  Diagnosis Date  . Anemia   . Headache(784.0)   . PONV  (postoperative nausea and vomiting)    Past Surgical History:  Procedure Laterality Date  . DILATION AND CURETTAGE OF UTERUS  2000  . DILITATION & CURRETTAGE/HYSTROSCOPY WITH NOVASURE ABLATION N/A 02/05/2013   Procedure: DILATATION & CURETTAGE/HYSTEROSCOPY WITH NOVASURE ABLATION;  Surgeon: Margarette Asal, MD;  Location: [facility];  Service: Gynecology;  Laterality: N/A;  . LAPAROSCOPIC TUBAL LIGATION Bilateral 02/05/2013   Procedure: LAPAROSCOPIC TUBAL LIGATION;  Surgeon: Margarette Asal, MD;  Location: [facility];  Service: Gynecology;  Laterality: Bilateral;  attempted tubal ligation   No Known Allergies Prior to Admission medications   Medication Sig Start Date End Date Taking? Authorizing Provider  calcium carbonate (CALCIUM 600) 1500 (600 Ca) MG TABS tablet Calcium 600   Yes [provider]  Cholecalciferol (VITAMIN D3) 1.25 MG (50000 UT) CAPS Vitamin D3   Yes [provider]  ibuprofen (ADVIL) 800 MG tablet ibuprofen 800 mg tablet   Yes [provider]  Multiple Vitamins-Minerals (CENTRUM ADULTS) TABS Take by mouth. Reported on 02/13/2016   Yes [provider]  Multiple Vitamins-Minerals (MULTIVITAMIN ADULT EXTRA C PO) multivitamin   Yes [provider]   Social History   Socioeconomic History  .  Marital status: Married    Spouse name: Not on file  . Number of children: Not on file  . Years of education: Not on file  . Highest education level: Not on file  Occupational History  . Not on file  Tobacco Use  . Smoking status: Never Smoker  . Smokeless tobacco: Never Used  Substance and Sexual Activity  . Alcohol use: Yes    Comment: occas  . Drug use: No  . Sexual activity: Not on file  Other Topics Concern  . Not on file  Social History Narrative  . Not on file   Social Determinants of Health   Financial Resource Strain:   . Difficulty of Paying Living Expenses:   Food Insecurity:   . Worried About Charity fundraiser in the  Last Year:   . Arboriculturist in the Last Year:   Transportation Needs:   . Film/video editor (Medical):   Marland Kitchen Lack of Transportation (Non-Medical):   Physical Activity:   . Days of Exercise per Week:   . Minutes of Exercise per Session:   Stress:   . Feeling of Stress :   Social Connections:   . Frequency of Communication with Friends and Family:   . Frequency of Social Gatherings with Friends and Family:   . Attends Religious Services:   . Active Member of Clubs or Organizations:   . Attends Archivist Meetings:   Marland Kitchen Marital Status:   Intimate Partner Violence:   . Fear of Current or Ex-Partner:   . Emotionally Abused:   Marland Kitchen Physically Abused:   . Sexually Abused:     Review of Systems 13 point review of systems per patient health survey noted.  Negative other than as indicated above or in HPI.    Objective:   Vitals:   02/29/20 0836 02/29/20 0845  BP: (!) 141/89 136/84  Pulse: 95   Temp: 98.7 F (37.1 C)   TempSrc: Temporal   SpO2: 99%   Weight: 282 lb (127.9 kg)   Height: 5\' 2"  (1.575 m)      Physical Exam Constitutional:      Appearance: She is well-developed. She is obese.  HENT:     Head: Normocephalic and atraumatic.     Right Ear: External ear normal.     Left Ear: External ear normal.  Eyes:     Conjunctiva/sclera: Conjunctivae normal.     Pupils: Pupils are equal, round, and reactive to light.  Neck:     Thyroid: No thyromegaly.  Cardiovascular:     Rate and Rhythm: Normal rate and regular rhythm.     Heart sounds: Normal heart sounds. No murmur.  Pulmonary:     Effort: Pulmonary effort is normal. No respiratory distress.     Breath sounds: Normal breath sounds. No wheezing.  Abdominal:     General: Bowel sounds are normal.     Palpations: Abdomen is soft.     Tenderness: There is no abdominal tenderness.  Musculoskeletal:        General: No tenderness. Normal range of motion.     Cervical back: Normal range of motion and neck  supple.  Lymphadenopathy:     Cervical: No cervical adenopathy.  Skin:    General: Skin is warm and dry.     Findings: No rash.  Neurological:     Mental Status: She is alert and oriented to person, place, and time.  Psychiatric:  Behavior: Behavior normal.        Thought Content: Thought content normal.        Assessment & Plan:  [name] is a 49 y.o. female . Annual physical exam  - -anticipatory guidance as below in AVS, screening labs above. Health maintenance items as above in HPI discussed/recommended as applicable.   Screen for colon cancer - Plan: Ambulatory referral to Gastroenterology  -She will turn 49 in August.  Colonoscopy versus Cologuard discussed with potential risks and benefits of each as well as repeat testing intervals.  Chose colonoscopy, referral placed  Allergic rhinitis, unspecified seasonality, unspecified trigger - Plan: fluticasone (FLONASE) 50 MCG/ACT nasal spray  -Trial of Flonase nasal spray, option of over-the-counter less sedating antihistamine.  RTC precautions  Class 3 severe obesity without serious comorbidity with body mass index (BMI) of 50.0 to 59.9 in adult, unspecified obesity type ([place]) - Plan: Hemoglobin A1c  -Commended on exercise, avoidance of sugar-containing beverages.  Previously given number for healthy weight and wellness, plans to call them again.  Screening for diabetes mellitus - Plan: Comprehensive metabolic panel, Hemoglobin A1c  Screening for hyperlipidemia - Plan: Comprehensive metabolic panel, Lipid panel   Meds ordered this encounter  Medications  . fluticasone (FLONASE) 50 MCG/ACT nasal spray    Sig: Place 1-2 sprays into both nostrils daily.    Dispense:  16 g    Refill:  6   Patient Instructions   Flonase for allergies.  claritin or allegra over the counter can be used as well.   Keep up the good work with exercise and watching diet - goal of 150 minutes per week for exercise. Meeting with [facility] may also be helpful in weight loss, but let me know if there are questions.  Keeping You Healthy  Get These Tests 1. Blood Pressure- Have your blood pressure checked once a year by your health care provider.  Normal blood pressure is 120/80. 2. Weight- Have your body mass index (BMI) calculated to screen for obesity.  BMI is measure of body fat based on height and weight.  You can also calculate your own BMI at GravelBags.it. 3. Cholesterol- Have your cholesterol checked every 5 years starting at age 36 then yearly starting at age 37. 84. Chlamydia, HIV, and other sexually transmitted diseases- Get screened every year until age 77, then within three months of each new sexual provider. 5. Pap Test - Every 1-5 years; discuss with your health care provider. 6. Mammogram- Every 1-2 years starting at age 15--50  Take these medicines  Calcium with Vitamin D-Your body needs 1200 mg of Calcium each day and [phone] IU of Vitamin D daily.  Your body can only absorb 500 mg of Calcium at a time so Calcium must be taken in 2 or 3 divided doses throughout the day.  Multivitamin with folic acid- Once daily if it is possible for you to become pregnant.  Get these Immunizations  Gardasil-Series of three doses; prevents HPV related illness such as genital warts and cervical cancer.  Menactra-Single dose; prevents meningitis.  Tetanus shot- Every 10 years.  Flu shot-Every year.  Take these steps 1. Do not smoke-Your healthcare provider can help you quit.  For tips on how to quit go to www.smokefree.gov or call 1-800 QUITNOW. 2. Be physically active- Exercise 5 days a week for at least 30 minutes.  If you are not already physically active, start slow and gradually work up to 30 minutes of moderate  physical activity.  Examples of moderate activity include walking briskly, dancing, swimming, bicycling, etc. 3. Breast Cancer- A self breast exam every month is important for  early detection of breast cancer.  For more information and instruction on self breast exams, ask your healthcare provider or https://www.patel.info/. 4. Eat a healthy diet- Eat a variety of healthy foods such as fruits, vegetables, whole grains, low fat milk, low fat cheeses, yogurt, lean meats, poultry and fish, beans, nuts, tofu, etc.  For more information go to www. Thenutritionsource.org 5. Drink alcohol in moderation- Limit alcohol intake to one drink or less per day. Never drink and drive. 6. Depression- Your emotional health is as important as your physical health.  If you're feeling down or losing interest in things you normally enjoy please talk to your healthcare provider about being screened for depression. 7. Dental visit- Brush and floss your teeth twice daily; visit your dentist twice a year. 8. Eye doctor- Get an eye exam at least every 2 years. 9. Helmet use- Always wear a helmet when riding a bicycle, motorcycle, rollerblading or skateboarding. 64. Safe sex- If you may be exposed to sexually transmitted infections, use a condom. 11. Seat belts- Seat belts can save your live; always wear one. 12. Smoke/Carbon Monoxide detectors- These detectors need to be installed on the appropriate level of your home. Replace batteries at least once a year. 13. Skin cancer- When out in the sun please cover up and use sunscreen 15 SPF or higher. 14. Violence- If anyone is threatening or hurting you, please tell your healthcare provider.            If you have lab work done today you will be contacted with your lab results within the next 2 weeks.  If you have not heard from [place] then please contact us. The fastest way to get your results is to register for My Chart.   IF you received an x-ray today, you will receive an invoice from [facility]. Please contact [facility] at [phone] with questions or concerns regarding your invoice.   IF you  received labwork today, you will receive an invoice from [facility]. Please contact [facility] at [phone] with questions or concerns regarding your invoice.   Our billing staff will not be able to assist you with questions regarding bills from these companies.  You will be contacted with the lab results as soon as they are available. The fastest way to get your results is to activate your My Chart account. Instructions are located on the last page of this paperwork. If you have not heard from [place] regarding the results in 2 weeks, please contact this office.         Signed, [name], MD [facility]

## 2020-02-29 NOTE — Patient Instructions (Addendum)
Flonase for allergies.  claritin or allegra over the counter can be used as well.   Keep up the good work with exercise and watching diet - goal of 150 minutes per week for exercise. Meeting with [facility] may also be helpful in weight loss, but let me know if there are questions.  Keeping You Healthy  Get These Tests 1. Blood Pressure- Have your blood pressure checked once a year by your health care provider.  Normal blood pressure is 120/80. 2. Weight- Have your body mass index (BMI) calculated to screen for obesity.  BMI is measure of body fat based on height and weight.  You can also calculate your own BMI at GravelBags.it. 3. Cholesterol- Have your cholesterol checked every 5 years starting at age 76 then yearly starting at age 45. 4. Chlamydia, HIV, and other sexually transmitted diseases- Get screened every year until age 50, then within three months of each new sexual provider. 5. Pap Test - Every 1-5 years; discuss with your health care provider. 6. Mammogram- Every 1-2 years starting at age 74--50  Take these medicines  Calcium with Vitamin D-Your body needs 1200 mg of Calcium each day and [phone] IU of Vitamin D daily.  Your body can only absorb 500 mg of Calcium at a time so Calcium must be taken in 2 or 3 divided doses throughout the day.  Multivitamin with folic acid- Once daily if it is possible for you to become pregnant.  Get these Immunizations  Gardasil-Series of three doses; prevents HPV related illness such as genital warts and cervical cancer.  Menactra-Single dose; prevents meningitis.  Tetanus shot- Every 10 years.  Flu shot-Every year.  Take these steps 1. Do not smoke-Your healthcare provider can help you quit.  For tips on how to quit go to www.smokefree.gov or call 1-800 QUITNOW. 2. Be physically active- Exercise 5 days a week for at least 30 minutes.  If you are not already physically active, start slow and gradually work up  to 30 minutes of moderate physical activity.  Examples of moderate activity include walking briskly, dancing, swimming, bicycling, etc. 3. Breast Cancer- A self breast exam every month is important for early detection of breast cancer.  For more information and instruction on self breast exams, ask your healthcare provider or https://www.patel.info/. 4. Eat a healthy diet- Eat a variety of healthy foods such as fruits, vegetables, whole grains, low fat milk, low fat cheeses, yogurt, lean meats, poultry and fish, beans, nuts, tofu, etc.  For more information go to www. Thenutritionsource.org 5. Drink alcohol in moderation- Limit alcohol intake to one drink or less per day. Never drink and drive. 6. Depression- Your emotional health is as important as your physical health.  If you're feeling down or losing interest in things you normally enjoy please talk to your healthcare provider about being screened for depression. 7. Dental visit- Brush and floss your teeth twice daily; visit your dentist twice a year. 8. Eye doctor- Get an eye exam at least every 2 years. 9. Helmet use- Always wear a helmet when riding a bicycle, motorcycle, rollerblading or skateboarding. 51. Safe sex- If you may be exposed to sexually transmitted infections, use a condom. 11. Seat belts- Seat belts can save your live; always wear one. 12. Smoke/Carbon Monoxide detectors- These detectors need to be installed on the appropriate level of your home. Replace batteries at least once a year. 13. Skin cancer- When out in the sun please cover up and  use sunscreen 15 SPF or higher. 14. Violence- If anyone is threatening or hurting you, please tell your healthcare provider.            If you have lab work done today you will be contacted with your lab results within the next 2 weeks.  If you have not heard from [place] then please contact us. The fastest way to get your results is to register for My Chart.   IF  you received an x-ray today, you will receive an invoice from [facility]. Please contact [facility] at [phone] with questions or concerns regarding your invoice.   IF you received labwork today, you will receive an invoice from [facility]. Please contact [facility] at [phone] with questions or concerns regarding your invoice.   Our billing staff will not be able to assist you with questions regarding bills from these companies.  You will be contacted with the lab results as soon as they are available. The fastest way to get your results is to activate your My Chart account. Instructions are located on the last page of this paperwork. If you have not heard from [place] regarding the results in 2 weeks, please contact this office.

## 2020-03-01 LAB — HEMOGLOBIN A1C
Est. average glucose Bld gHb Est-mCnc: 114 mg/dL
Hgb A1c MFr Bld: 5.6 % (ref 4.8–5.6)

## 2020-03-01 LAB — COMPREHENSIVE METABOLIC PANEL
ALT: 15 IU/L (ref 0–32)
AST: 16 IU/L (ref 0–40)
Albumin/Globulin Ratio: 1.7 (ref 1.2–2.2)
Albumin: 4.3 g/dL (ref 3.8–4.8)
Alkaline Phosphatase: 89 IU/L (ref 39–117)
BUN/Creatinine Ratio: 17 (ref 9–23)
BUN: 13 mg/dL (ref 6–24)
Bilirubin Total: 0.2 mg/dL (ref 0.0–1.2)
CO2: 24 mmol/L (ref 20–29)
Calcium: 9.2 mg/dL (ref 8.7–10.2)
Chloride: 102 mmol/L (ref 96–106)
Creatinine, Ser: 0.78 mg/dL (ref 0.57–1.00)
GFR calc Af Amer: 103 mL/min/{1.73_m2} (ref >59)
GFR calc non Af Amer: 90 mL/min/{1.73_m2} (ref >59)
Globulin, Total: 2.5 g/dL (ref 1.5–4.5)
Glucose: 97 mg/dL (ref 65–99)
Potassium: 4.5 mmol/L (ref 3.5–5.2)
Sodium: 141 mmol/L (ref 134–144)
Total Protein: 6.8 g/dL (ref 6.0–8.5)

## 2020-03-01 LAB — LIPID PANEL
Chol/HDL Ratio: 3.2 ratio (ref 0.0–4.4)
Cholesterol, Total: 151 mg/dL (ref 100–199)
HDL: 47 mg/dL (ref >39)
LDL Chol Calc (NIH): 92 mg/dL (ref 0–99)
Triglycerides: 59 mg/dL (ref 0–149)
VLDL Cholesterol Cal: 12 mg/dL (ref 5–40)

## 2020-05-24 ENCOUNTER — Encounter: Payer: Self-pay | Admitting: Gastroenterology

## 2020-06-29 ENCOUNTER — Telehealth: Payer: Self-pay | Admitting: *Deleted

## 2020-06-29 NOTE — Telephone Encounter (Signed)
Patient called back. I gave her Dr.Danis' recommendations. She will call us back in a couple of weeks to let us know if she has lost weight. Patient aware PV and colon cancelled at this time. She did not want to go on waiting list at this time.

## 2020-06-29 NOTE — Telephone Encounter (Signed)
If she wishes to proceed with colonoscopy at this time, then she will need to go on the waiting list for my hospital cases, as I am currently booking out at least 2 months for elective procedures (and that is if we do not have another reduction in those cases due to Covid surge).  Alternatively, if she believes that she could make a concerted effort to lose enough weight to get her BMI below 50, then she could have her colonoscopy in the [facility], which would allow more availability of dates and times, and is also usually less expensive than having it done at the hospital.

## 2020-06-29 NOTE — Telephone Encounter (Signed)
Dr.Danis,  Patient is scheduled for a direct screening colonoscopy. Her last weight on 02/29/20 was 282 lbs and her BMI=51.58, spoke with patient and confirmed. Would you like an OV or direct hospital colon ok? Please advise. Thank you, Alexsia Klindt pv

## 2020-07-21 ENCOUNTER — Encounter: Payer: 59 | Admitting: Gastroenterology

## 2020-08-04 ENCOUNTER — Encounter: Payer: Self-pay | Admitting: Family Medicine

## 2020-08-12 ENCOUNTER — Telehealth: Payer: Self-pay | Admitting: *Deleted

## 2020-08-12 NOTE — Telephone Encounter (Signed)
Schedule Mammogram Mobile Unit 08-15-2020 [facility]

## 2021-01-12 ENCOUNTER — Telehealth: Payer: Self-pay | Admitting: Family Medicine

## 2021-01-12 NOTE — Telephone Encounter (Signed)
Called patient LVM for her to call us back to schedule an appt per mychart message

## 2021-03-23 DIAGNOSIS — Z01419 Encounter for gynecological examination (general) (routine) without abnormal findings: Secondary | ICD-10-CM | POA: Diagnosis not present

## 2021-03-23 DIAGNOSIS — Z1231 Encounter for screening mammogram for malignant neoplasm of breast: Secondary | ICD-10-CM | POA: Diagnosis not present

## 2021-03-23 DIAGNOSIS — Z6841 Body Mass Index (BMI) 40.0 and over, adult: Secondary | ICD-10-CM | POA: Diagnosis not present

## 2021-04-13 ENCOUNTER — Other Ambulatory Visit (HOSPITAL_COMMUNITY): Payer: Self-pay

## 2021-04-13 ENCOUNTER — Other Ambulatory Visit: Payer: Self-pay

## 2021-04-13 ENCOUNTER — Ambulatory Visit (INDEPENDENT_AMBULATORY_CARE_PROVIDER_SITE_OTHER): Payer: 59 | Admitting: Family Medicine

## 2021-04-13 ENCOUNTER — Encounter: Payer: Self-pay | Admitting: Family Medicine

## 2021-04-13 VITALS — BP 132/72 | HR 78 | Temp 98.2°F | Resp 17 | Ht 62.0 in | Wt 285.4 lb

## 2021-04-13 DIAGNOSIS — Z6841 Body Mass Index (BMI) 40.0 and over, adult: Secondary | ICD-10-CM

## 2021-04-13 DIAGNOSIS — Z1211 Encounter for screening for malignant neoplasm of colon: Secondary | ICD-10-CM

## 2021-04-13 DIAGNOSIS — Z13220 Encounter for screening for lipoid disorders: Secondary | ICD-10-CM

## 2021-04-13 DIAGNOSIS — Z0000 Encounter for general adult medical examination without abnormal findings: Secondary | ICD-10-CM

## 2021-04-13 DIAGNOSIS — Z23 Encounter for immunization: Secondary | ICD-10-CM | POA: Diagnosis not present

## 2021-04-13 DIAGNOSIS — Z1159 Encounter for screening for other viral diseases: Secondary | ICD-10-CM

## 2021-04-13 DIAGNOSIS — Z131 Encounter for screening for diabetes mellitus: Secondary | ICD-10-CM | POA: Diagnosis not present

## 2021-04-13 DIAGNOSIS — J309 Allergic rhinitis, unspecified: Secondary | ICD-10-CM | POA: Diagnosis not present

## 2021-04-13 LAB — COMPREHENSIVE METABOLIC PANEL
ALT: 19 U/L (ref 0–35)
AST: 18 U/L (ref 0–37)
Albumin: 4.5 g/dL (ref 3.5–5.2)
Alkaline Phosphatase: 72 U/L (ref 39–117)
BUN: 11 mg/dL (ref 6–23)
CO2: 32 mEq/L (ref 19–32)
Calcium: 9.5 mg/dL (ref 8.4–10.5)
Chloride: 101 mEq/L (ref 96–112)
Creatinine, Ser: 0.7 mg/dL (ref 0.40–1.20)
GFR: 100.58 mL/min (ref >60.00)
Glucose, Bld: 96 mg/dL (ref 70–99)
Potassium: 4.1 mEq/L (ref 3.5–5.1)
Sodium: 140 mEq/L (ref 135–145)
Total Bilirubin: 0.4 mg/dL (ref 0.2–1.2)
Total Protein: 7.3 g/dL (ref 6.0–8.3)

## 2021-04-13 LAB — LIPID PANEL
Cholesterol: 157 mg/dL (ref 0–200)
HDL: 40.2 mg/dL (ref >39.00)
LDL Cholesterol: 92 mg/dL (ref 0–99)
NonHDL: 116.48
Total CHOL/HDL Ratio: 4
Triglycerides: 120 mg/dL (ref 0.0–149.0)
VLDL: 24 mg/dL (ref 0.0–40.0)

## 2021-04-13 LAB — HEMOGLOBIN A1C: Hgb A1c MFr Bld: 6 % (ref 4.6–6.5)

## 2021-04-13 MED ORDER — FLUTICASONE PROPIONATE 50 MCG/ACT NA SUSP
1.0000 | Freq: Every day | NASAL | 6 refills | Status: DC
  Filled 2021-04-13: qty 16, 30d supply, fill #0

## 2021-04-13 NOTE — Progress Notes (Signed)
Subjective:  Patient ID: [name], female    DOB: [birth year]  Age: 50 y.o. MRN: [id number]  CC:  Chief Complaint  Patient presents with  . Annual Exam    Pt doing well is requesting allergie medication as allergies have been more severe this year than normal.  Pt was referred to GI for colonoscopy last OV but was unable to complete this due to weight restrictions. Pt asking if could do cologuard in place.  Refills flonase as well     HPI [name] presents for   Annual physical exam:  Depression screening noted.  Sister passed away Apr 04, 2023 with pancreatic CA. Will be going to [place] for funeral.  Feeling down, not depressed. Has been talking to family. Denies need for counseling or resources at this time.   Allergic rhinitis.  Using flonase - working well. Taking turmeric, vit D supplement and MVI as supplement.  No results found for: Davy Pique, VD25OH   OBGYN: Dr. Matthew Saras on 03/23/21. Had pap and mammogram.   Depression screen [facility] 2/9 02/29/2020 01/21/2019 11/08/2017 12/13/2016 10/30/2016  Decreased Interest 0 0 0 0 0  Down, Depressed, Hopeless 0 0 0 0 0  PHQ - 2 Score 0 0 0 0 0   Cancer screening: Colon: no FH of colon CA or polyps/bleeding. Referred for colonoscopy - table limit for weight. Screening options with colonoscopy versus Cologuard discussed. Discussed timing of repeat testing intervals if normal, as well as potential need for diagnostic Colonoscopy if positive Cologuard. Understanding expressed, and chose Cologuard.  Recent pap on 03/23/21 Mammogram on 03/23/21  Immunization History  Administered Date(s) Administered  . Influenza,inj,Quad PF,6+ Mos 07/20/2016  . PFIZER(Purple Top)SARS-COV-2 Vaccination 11/24/2019, 12/14/2019, 09/16/2020  . Tdap 11/08/2017  option of second covid booster discussed.  shingrix today.    Hearing Screening   125Hz  250Hz  500Hz  1000Hz  2000Hz  3000Hz  4000Hz  6000Hz  8000Hz   Right ear:           Left ear:              Visual Acuity Screening   Right eye Left eye Both eyes  Without correction:     With correction: 20/20 20/20 20/20   optho - appt in October.   Dental: every 66months.   Exercise/obesity: Walking 30 miles 2 days per week.  No sweet tea, rare soda, no fast food. Some fried food at times.  Would like to work on diet/exercsie - declines specialist referral at this time.  Lab Results  Component Value Date   HGBA1C 5.6 02/29/2020   Body mass index is 52.2 kg/m.  Wt Readings from Last 3 Encounters:  04/13/21 285 lb 6.4 oz (129.5 kg)  02/29/20 282 lb (127.9 kg)  01/21/19 269 lb (122 kg)   13 point review of systems per patient health survey noted.  Negative other than as indicated above or in HPI.   History There are no problems to display for this patient.  Past Medical History:  Diagnosis Date  . Anemia   . Headache(784.0)   . PONV (postoperative nausea and vomiting)    Past Surgical History:  Procedure Laterality Date  . DILATION AND CURETTAGE OF UTERUS  2000  . DILITATION & CURRETTAGE/HYSTROSCOPY WITH NOVASURE ABLATION N/A 02/05/2013   Procedure: DILATATION & CURETTAGE/HYSTEROSCOPY WITH NOVASURE ABLATION;  Surgeon: Margarette Asal, MD;  Location: [facility];  Service: Gynecology;  Laterality: N/A;  . LAPAROSCOPIC TUBAL LIGATION Bilateral 02/05/2013   Procedure: LAPAROSCOPIC TUBAL LIGATION;  Surgeon: Delfino Lovett  Garry Heater, MD;  Location: [facility];  Service: Gynecology;  Laterality: Bilateral;  attempted tubal ligation   No Known Allergies Prior to Admission medications   Medication Sig Start Date End Date Taking? Authorizing Provider  calcium carbonate (OSCAL) 1500 (600 Ca) MG TABS tablet Calcium 600   Yes [provider]  Cholecalciferol (VITAMIN D3) 1.25 MG (50000 UT) CAPS Vitamin D3   Yes [provider]  fluticasone (FLONASE) 50 MCG/ACT nasal spray Place 1-2 sprays into both nostrils daily. 02/29/20  Yes [name], MD  ibuprofen (ADVIL) 800 MG tablet  ibuprofen 800 mg tablet   Yes [provider]  Multiple Vitamins-Minerals (CENTRUM ADULTS) TABS Take by mouth. Reported on 02/13/2016   Yes [provider]  Multiple Vitamins-Minerals (MULTIVITAMIN ADULT EXTRA C PO) multivitamin   Yes [provider]   Social History   Socioeconomic History  . Marital status: Married    Spouse name: Not on file  . Number of children: Not on file  . Years of education: Not on file  . Highest education level: Not on file  Occupational History  . Not on file  Tobacco Use  . Smoking status: Never Smoker  . Smokeless tobacco: Never Used  Vaping Use  . Vaping Use: Never used  Substance and Sexual Activity  . Alcohol use: Yes    Comment: occas  . Drug use: No  . Sexual activity: Yes  Other Topics Concern  . Not on file  Social History Narrative  . Not on file   Social Determinants of Health   Financial Resource Strain: Not on file  Food Insecurity: Not on file  Transportation Needs: Not on file  Physical Activity: Not on file  Stress: Not on file  Social Connections: Not on file  Intimate Partner Violence: Not on file    Review of Systems   Objective:   Vitals:   04/13/21 0826  BP: 132/72  Pulse: 78  Resp: 17  Temp: 98.2 F (36.8 C)  TempSrc: Temporal  SpO2: 98%  Weight: 285 lb 6.4 oz (129.5 kg)  Height: 5\' 2"  (1.575 m)     Physical Exam Vitals reviewed.  Constitutional:      Appearance: She is well-developed. She is obese.  HENT:     Head: Normocephalic and atraumatic.     Right Ear: External ear normal.     Left Ear: External ear normal.  Eyes:     Conjunctiva/sclera: Conjunctivae normal.     Pupils: Pupils are equal, round, and reactive to light.  Neck:     Thyroid: No thyromegaly.  Cardiovascular:     Rate and Rhythm: Normal rate and regular rhythm.     Heart sounds: Normal heart sounds. No murmur heard.   Pulmonary:     Effort: Pulmonary effort is normal. No respiratory distress.      Breath sounds: Normal breath sounds. No wheezing.  Abdominal:     General: Bowel sounds are normal.     Palpations: Abdomen is soft.     Tenderness: There is no abdominal tenderness.  Musculoskeletal:        General: No tenderness. Normal range of motion.     Cervical back: Normal range of motion and neck supple.  Lymphadenopathy:     Cervical: No cervical adenopathy.  Skin:    General: Skin is warm and dry.     Findings: No rash.  Neurological:     Mental Status: She is alert and oriented to person, place,  and time.  Psychiatric:        Behavior: Behavior normal.        Thought Content: Thought content normal.        Assessment & Plan:  [name] is a 50 y.o. female . Annual physical exam  - -anticipatory guidance as below in AVS, screening labs above. Health maintenance items as above in HPI discussed/recommended as applicable.   - appropriate grieving with loss of family member.  EAP/counseling options discussed if needed.  Allergic rhinitis, unspecified seasonality, unspecified trigger - Plan: fluticasone (FLONASE) 50 MCG/ACT nasal spray  Need for hepatitis C screening test - Plan: Hepatitis C antibody  Screening for diabetes mellitus - Plan: Hemoglobin A1c, Comprehensive metabolic panel  Screening for hyperlipidemia - Plan: Lipid panel, Comprehensive metabolic panel  Class 3 severe obesity without serious comorbidity with body mass index (BMI) of 50.0 to 59.9 in adult, unspecified obesity type ([place])  - concerns discussed given current BMI. Commended on diet/exercise changes, additional recommendations given. Would consider medical or surgical weight loss specialist eval  Screen for colon cancer - Plan: Cologuard  Need for shingles vaccine - Plan: Varicella-zoster vaccine IM   Meds ordered this encounter  Medications  . fluticasone (FLONASE) 50 MCG/ACT nasal spray    Sig: Place 1-2 sprays into both nostrils daily.    Dispense:  16 g    Refill:  6    Patient Instructions  I am sorry to hear about your sister's passing. Please let me know if we can be of help in this difficult time. [facility] also has an employee assistance program with counseling if needed as well.   You are eligible for updated covid 19 booster. I do recommend one.   flonase refilled. I will check some labs and order Cologuard.   Keep up the good work with diet and exercise - goal 150 minutes per week with exercise most days per week. I am happy to refer you to weight management specialist or surgical weight management specialist if needed. Keep up the good work.   Keeping You Healthy  Get These Tests  Blood Pressure- Have your blood pressure checked by your healthcare provider at least once a year.  Normal blood pressure is 120/80.  Weight- Have your body mass index (BMI) calculated to screen for obesity.  BMI is a measure of body fat based on height and weight.  You can calculate your own BMI at [employer]  Cholesterol- Have your cholesterol checked every year.  Diabetes- Have your blood sugar checked every year if you have high blood pressure, high cholesterol, a family history of diabetes or if you are overweight.  Pap Test - Have a pap test every 1 to 5 years if you have been sexually active.  If you are older than 65 and recent pap tests have been normal you may not need additional pap tests.  In addition, if you have had a hysterectomy  for benign disease additional pap tests are not necessary.  Mammogram-Yearly mammograms are essential for early detection of breast cancer  Screening for Colon Cancer- Colonoscopy starting at age 79. Screening may begin sooner depending on your family history and other health conditions.  Follow up colonoscopy as directed by your Gastroenterologist.  Screening for Osteoporosis- Screening begins at age 57 with bone density scanning, sooner if you are at higher risk for developing Osteoporosis.  Get these  medicines  Calcium with Vitamin D- Your body requires 1200-1500 mg of Calcium a day  and [phone] IU of Vitamin D a day.  You can only absorb 500 mg of Calcium at a time therefore Calcium must be taken in 2 or 3 separate doses throughout the day.  Hormones- Hormone therapy has been associated with increased risk for certain cancers and heart disease.  Talk to your healthcare provider about if you need relief from menopausal symptoms.  Aspirin- Ask your healthcare provider about taking Aspirin to prevent Heart Disease and Stroke.  Get these Immuniztions  Flu shot- Every fall  Pneumonia shot- Once after the age of 61; if you are younger ask your healthcare provider if you need a pneumonia shot. Tetanus- Every ten years. Shingles vaccine today - repeat in 2-6 months.   Take these steps  Don't smoke- Your healthcare provider can help you quit. For tips on how to quit, ask your healthcare provider or go to www.smokefree.gov or call 1-800 QUIT-NOW.  Be physically active- Exercise 5 days a week for a minimum of 30 minutes.  If you are not already physically active, start slow and gradually work up to 30 minutes of moderate physical activity.  Try walking, dancing, bike riding, swimming, etc.  Eat a healthy diet- Eat a variety of healthy foods such as fruits, vegetables, whole grains, low fat milk, low fat cheeses, yogurt, lean meats, chicken, fish, eggs, dried beans, tofu, etc.  For more information go to www.thenutritionsource.org  Dental visit- Brush and floss teeth twice daily; visit your dentist twice a year.  Eye exam- Visit your Optometrist or Ophthalmologist yearly.  Drink alcohol in moderation- Limit alcohol intake to one drink or less a day.  Never drink and drive.  Depression- Your emotional health is as important as your physical health.  If you're feeling down or losing interest in things you normally enjoy, please talk to your healthcare provider.  Seat Belts- can save your life;  always wear one  Smoke/Carbon Monoxide detectors- These detectors need to be installed on the appropriate level of your home.  Replace batteries at least once a year.  Violence- If anyone is threatening or hurting you, please tell your healthcare provider.  Living Will/ Health care power of attorney- Discuss with your healthcare provider and family.        Signed, [name], MD [facility]

## 2021-04-13 NOTE — Patient Instructions (Addendum)
I am sorry to hear about your sister's passing. Please let me know if we can be of help in this difficult time. [facility] also has an employee assistance program with counseling if needed as well.   You are eligible for updated covid 19 booster. I do recommend one.   flonase refilled. I will check some labs and order Cologuard.   Keep up the good work with diet and exercise - goal 150 minutes per week with exercise most days per week. I am happy to refer you to weight management specialist or surgical weight management specialist if needed. Keep up the good work.   Keeping You Healthy  Get These Tests  Blood Pressure- Have your blood pressure checked by your healthcare provider at least once a year.  Normal blood pressure is 120/80.  Weight- Have your body mass index (BMI) calculated to screen for obesity.  BMI is a measure of body fat based on height and weight.  You can calculate your own BMI at [employer]  Cholesterol- Have your cholesterol checked every year.  Diabetes- Have your blood sugar checked every year if you have high blood pressure, high cholesterol, a family history of diabetes or if you are overweight.  Pap Test - Have a pap test every 1 to 5 years if you have been sexually active.  If you are older than 65 and recent pap tests have been normal you may not need additional pap tests.  In addition, if you have had a hysterectomy  for benign disease additional pap tests are not necessary.  Mammogram-Yearly mammograms are essential for early detection of breast cancer  Screening for Colon Cancer- Colonoscopy starting at age 35. Screening may begin sooner depending on your family history and other health conditions.  Follow up colonoscopy as directed by your Gastroenterologist.  Screening for Osteoporosis- Screening begins at age 38 with bone density scanning, sooner if you are at higher risk for developing Osteoporosis.  Get these medicines  Calcium with  Vitamin D- Your body requires 1200-1500 mg of Calcium a day and [phone] IU of Vitamin D a day.  You can only absorb 500 mg of Calcium at a time therefore Calcium must be taken in 2 or 3 separate doses throughout the day.  Hormones- Hormone therapy has been associated with increased risk for certain cancers and heart disease.  Talk to your healthcare provider about if you need relief from menopausal symptoms.  Aspirin- Ask your healthcare provider about taking Aspirin to prevent Heart Disease and Stroke.  Get these Immuniztions  Flu shot- Every fall  Pneumonia shot- Once after the age of 30; if you are younger ask your healthcare provider if you need a pneumonia shot. Tetanus- Every ten years. Shingles vaccine today - repeat in 2-6 months.   Take these steps  Don't smoke- Your healthcare provider can help you quit. For tips on how to quit, ask your healthcare provider or go to www.smokefree.gov or call 1-800 QUIT-NOW.  Be physically active- Exercise 5 days a week for a minimum of 30 minutes.  If you are not already physically active, start slow and gradually work up to 30 minutes of moderate physical activity.  Try walking, dancing, bike riding, swimming, etc.  Eat a healthy diet- Eat a variety of healthy foods such as fruits, vegetables, whole grains, low fat milk, low fat cheeses, yogurt, lean meats, chicken, fish, eggs, dried beans, tofu, etc.  For more information go to www.thenutritionsource.org  Dental visit- Brush and floss  teeth twice daily; visit your dentist twice a year.  Eye exam- Visit your Optometrist or Ophthalmologist yearly.  Drink alcohol in moderation- Limit alcohol intake to one drink or less a day.  Never drink and drive.  Depression- Your emotional health is as important as your physical health.  If you're feeling down or losing interest in things you normally enjoy, please talk to your healthcare provider.  Seat Belts- can save your life; always wear  one  Smoke/Carbon Monoxide detectors- These detectors need to be installed on the appropriate level of your home.  Replace batteries at least once a year.  Violence- If anyone is threatening or hurting you, please tell your healthcare provider.  Living Will/ Health care power of attorney- Discuss with your healthcare provider and family.

## 2021-04-14 LAB — HEPATITIS C ANTIBODY
Hepatitis C Ab: NONREACTIVE
SIGNAL TO CUT-OFF: 0.01 (ref <1.00)

## 2021-04-18 ENCOUNTER — Other Ambulatory Visit (HOSPITAL_COMMUNITY): Payer: Self-pay

## 2021-04-18 MED ORDER — CARESTART COVID-19 HOME TEST VI KIT
PACK | 0 refills | Status: DC
  Filled 2021-04-18: qty 4, 4d supply, fill #0

## 2021-04-19 ENCOUNTER — Other Ambulatory Visit (HOSPITAL_COMMUNITY): Payer: Self-pay

## 2021-04-19 MED ORDER — AZITHROMYCIN 500 MG PO TABS
500.0000 mg | ORAL_TABLET | ORAL | 0 refills | Status: DC
  Filled 2021-04-19: qty 4, 2d supply, fill #0

## 2021-04-19 MED ORDER — ATOVAQUONE-PROGUANIL HCL 250-100 MG PO TABS
ORAL_TABLET | ORAL | 0 refills | Status: DC
  Filled 2021-04-19: qty 20, 20d supply, fill #0

## 2021-04-20 ENCOUNTER — Other Ambulatory Visit (HOSPITAL_COMMUNITY): Payer: Self-pay

## 2021-10-02 DIAGNOSIS — R102 Pelvic and perineal pain: Secondary | ICD-10-CM | POA: Diagnosis not present

## 2021-10-09 DIAGNOSIS — N939 Abnormal uterine and vaginal bleeding, unspecified: Secondary | ICD-10-CM | POA: Diagnosis not present

## 2021-10-13 ENCOUNTER — Other Ambulatory Visit: Payer: Self-pay | Admitting: Obstetrics and Gynecology

## 2021-10-13 ENCOUNTER — Other Ambulatory Visit (HOSPITAL_COMMUNITY): Payer: Self-pay | Admitting: Obstetrics and Gynecology

## 2021-10-13 DIAGNOSIS — Z9889 Other specified postprocedural states: Secondary | ICD-10-CM

## 2021-10-13 DIAGNOSIS — N950 Postmenopausal bleeding: Secondary | ICD-10-CM

## 2021-10-21 ENCOUNTER — Ambulatory Visit (HOSPITAL_COMMUNITY)
Admission: RE | Admit: 2021-10-21 | Discharge: 2021-10-21 | Disposition: A | Discharge status: Home / Self Care | Payer: 59 | Source: Ambulatory Visit | Attending: Obstetrics and Gynecology | Admitting: Obstetrics and Gynecology

## 2021-10-21 ENCOUNTER — Other Ambulatory Visit: Payer: Self-pay

## 2021-10-21 DIAGNOSIS — N950 Postmenopausal bleeding: Secondary | ICD-10-CM | POA: Diagnosis not present

## 2021-10-21 DIAGNOSIS — Z9889 Other specified postprocedural states: Secondary | ICD-10-CM | POA: Insufficient documentation

## 2021-10-21 DIAGNOSIS — N83201 Unspecified ovarian cyst, right side: Secondary | ICD-10-CM | POA: Diagnosis not present

## 2021-10-21 DIAGNOSIS — N852 Hypertrophy of uterus: Secondary | ICD-10-CM | POA: Diagnosis not present

## 2021-10-21 DIAGNOSIS — N858 Other specified noninflammatory disorders of uterus: Secondary | ICD-10-CM | POA: Diagnosis not present

## 2021-10-21 MED ORDER — GADOBUTROL 1 MMOL/ML IV SOLN
10.0000 mL | Freq: Once | INTRAVENOUS | Status: AC | PRN
  Administered 2021-10-21: 10 mL via INTRAVENOUS

## 2021-11-01 DIAGNOSIS — N8003 Adenomyosis of the uterus: Secondary | ICD-10-CM | POA: Diagnosis not present

## 2021-11-01 DIAGNOSIS — N939 Abnormal uterine and vaginal bleeding, unspecified: Secondary | ICD-10-CM | POA: Diagnosis not present

## 2021-12-18 ENCOUNTER — Encounter: Payer: Self-pay | Admitting: Family Medicine

## 2021-12-18 ENCOUNTER — Ambulatory Visit: Payer: 59 | Admitting: Family Medicine

## 2021-12-18 VITALS — BP 126/70 | HR 87 | Temp 98.2°F | Resp 16 | Ht 62.0 in | Wt 278.4 lb

## 2021-12-18 DIAGNOSIS — R7303 Prediabetes: Secondary | ICD-10-CM

## 2021-12-18 DIAGNOSIS — Z1211 Encounter for screening for malignant neoplasm of colon: Secondary | ICD-10-CM

## 2021-12-18 DIAGNOSIS — R519 Headache, unspecified: Secondary | ICD-10-CM

## 2021-12-18 NOTE — Patient Instructions (Addendum)
Keep up the good work with watching diet and activity to help with weight and prediabetes.  Let me know if you have any questions.  Please have prediabetes test performed at the [facility] across from [facility] on [address].   Headaches may be hormone related as discussed, option of temporary ibuprofen or Aleve but since they were quickly resolving may not need any medication at this time.  Can also discuss with gynecologist.  If those are increasing in frequency or changing please follow-up and we can discuss further.  Make sure to stay hydrated and see other information below on headaches.  I will refer you for colonoscopy as we discussed.   General Headache Without Cause A headache is pain or discomfort felt around the head or neck area. There are many causes and types of headaches. A few common types include: Tension headaches. Migraine headaches. Cluster headaches. Chronic daily headaches. Sometimes, the specific cause of a headache may not be found. Follow these instructions at home: Watch your condition for any changes. Let your health care provider know about them. Take these steps to help with your condition: Managing pain   Take over-the-counter and prescription medicines only as told by your health care provider. Treatment may include medicines for pain that are taken by mouth or applied to the skin. Lie down in a dark, quiet room when you have a headache. Keep lights dim if bright lights bother you or make your headaches worse. If directed, put ice on your head and neck area: Put ice in a plastic bag. Place a towel between your skin and the bag. Leave the ice on for 20 minutes, 2-3 times per day. Remove the ice if your skin turns bright red. This is very important. If you cannot feel pain, heat, or cold, you have a greater risk of damage to the area. If directed, apply heat to the affected area. Use the heat source that your health care provider recommends, such as a  moist heat pack or a heating pad. Place a towel between your skin and the heat source. Leave the heat on for 20-30 minutes. Remove the heat if your skin turns bright red. This is especially important if you are unable to feel pain, heat, or cold. You have a greater risk of getting burned. Eating and drinking Eat meals on a regular schedule. If you drink alcohol: Limit how much you have to: 0-1 drink a day for women who are not pregnant. 0-2 drinks a day for men. Know how much alcohol is in a drink. In the [place] one drink equals one 12 oz bottle of beer (355 mL), one 5 oz glass of wine (148 mL), or one 1 oz glass of hard liquor (44 mL). Stop drinking caffeine, or decrease the amount of caffeine you drink. Drink enough fluid to keep your urine pale yellow. General instructions  Keep a headache journal to help find out what may trigger your headaches. For example, write down: What you eat and drink. How much sleep you get. Any change to your diet or medicines. Try massage or other relaxation techniques. Limit stress. Sit up straight, and do not tense your muscles. Do not use any products that contain nicotine or tobacco. These products include cigarettes, chewing tobacco, and vaping devices, such as e-cigarettes. If you need help quitting, ask your health care provider. Exercise regularly as told by your health care provider. Sleep on a regular schedule. Get 7-9 hours of sleep each night,  or the amount recommended by your health care provider. Keep all follow-up visits. This is important. Contact a health care provider if: Medicine does not help your symptoms. You have a headache that is different from your usual headache. You have nausea or you vomit. You have a fever. Get help right away if: Your headache: Becomes severe quickly. Gets worse after moderate to intense physical activity. You have any of these symptoms: Repeated vomiting. Pain or stiffness in your neck. Changes to  your vision. Pain in an eye or ear. Problems with speech. Muscular weakness or loss of muscle control. Loss of balance or coordination. You feel faint or pass out. You have confusion. You have a seizure. These symptoms may represent a serious problem that is an emergency. Do not wait to see if the symptoms will go away. Get medical help right away. Call your local emergency services (911 in the [place] Do not drive yourself to the hospital. Summary A headache is pain or discomfort felt around the head or neck area. There are many causes and types of headaches. In some cases, the cause may not be found. Keep a headache journal to help find out what may trigger your headaches. Watch your condition for any changes. Let your health care provider know about them. Contact a health care provider if you have a headache that is different from the usual headache, or if your symptoms are not helped by medicine. Get help right away if your headache becomes severe, you vomit, you have a loss of vision, you lose your balance, or you have a seizure. This information is not intended to replace advice given to you by your health care provider. Make sure you discuss any questions you have with your health care provider. Document Revised: 04/05/2021 Document Reviewed: 04/05/2021 Elsevier Patient Education  [employer].

## 2021-12-18 NOTE — Progress Notes (Signed)
Subjective:  Patient ID: [name], female    DOB: 1971-04-24  Age: 51 y.o. MRN: [id number]  CC:  Chief Complaint  Patient presents with   Headache    Pt reports has had headaches on and off in her sleep and thinks this has to do with premenopausal,     HPI [name] presents for   Prediabetes with obesity.  Slight elevation of A1c at her May 2022 visit.  Weight has improved since that time. Has adjusted diet and walking more.   Body mass index is 50.92 kg/m.  Lab Results  Component Value Date   HGBA1C 6.0 04/13/2021   Wt Readings from Last 3 Encounters:  12/18/21 278 lb 6.4 oz (126.3 kg)  04/13/21 285 lb 6.4 oz (129.5 kg)  02/29/20 282 lb (127.9 kg)    Headaches: Slight headache that resolves in few minutes. Sometimes in middle of night - does not wake her up. Some during day.  Usually around time of menses. Perimenopausal. LMP 11/10/21 - spacing out menses.  HA only few days, no HA this month.  Tx: none.   GYN - Dr. Matthew Saras.  Menstual cramps  with HA. Enlarged uterus on MRI in 10/23/21. Planon follow up if cramps persist. Considering hysterectomy if cramps persist. None currently.   HM: Cologuard ordered - she prefers to have colonoscopy.   History There are no problems to display for this patient.  Past Medical History:  Diagnosis Date   Anemia    Headache(784.0)    PONV (postoperative nausea and vomiting)    Past Surgical History:  Procedure Laterality Date   DILATION AND CURETTAGE OF UTERUS  2000   DILITATION & CURRETTAGE/HYSTROSCOPY WITH NOVASURE ABLATION N/A 02/05/2013   Procedure: DILATATION & CURETTAGE/HYSTEROSCOPY WITH NOVASURE ABLATION;  Surgeon: Margarette Asal, MD;  Location: [facility];  Service: Gynecology;  Laterality: N/A;   LAPAROSCOPIC TUBAL LIGATION Bilateral 02/05/2013   Procedure: LAPAROSCOPIC TUBAL LIGATION;  Surgeon: Margarette Asal, MD;  Location: [facility];  Service: Gynecology;  Laterality: Bilateral;  attempted tubal ligation    No Known Allergies Prior to Admission medications   Medication Sig Start Date End Date Taking? Authorizing Provider  atovaquone-proguanil (MALARONE) 250-100 MG TABS tablet Take 1 tablet by mouth once daily. Begin 2 days prearrival, take daily during stay, and continue for 7 days post travel for malaria prevention. 04/19/21  Yes Odis Luster, MD  calcium carbonate (OSCAL) 1500 (600 Ca) MG TABS tablet Calcium 600   Yes [provider]  Cholecalciferol (VITAMIN D3) 1.25 MG (50000 UT) CAPS Vitamin D3   Yes [provider]  fluticasone (FLONASE) 50 MCG/ACT nasal spray Place 1-2 sprays into both nostrils daily. 04/13/21  Yes [name], MD  ibuprofen (ADVIL) 800 MG tablet ibuprofen 800 mg tablet   Yes [provider]  Multiple Vitamins-Minerals (CENTRUM ADULTS) TABS Take by mouth. Reported on 02/13/2016   Yes [provider]  Multiple Vitamins-Minerals (MULTIVITAMIN ADULT EXTRA C PO) multivitamin   Yes [provider]   Social History   Socioeconomic History   Marital status: Married    Spouse name: Not on file   Number of children: Not on file   Years of education: Not on file   Highest education level: Not on file  Occupational History   Not on file  Tobacco Use   Smoking status: Never   Smokeless tobacco: Never  Vaping Use   Vaping Use: Never used  Substance and Sexual Activity  Alcohol use: Yes    Comment: occas   Drug use: No   Sexual activity: Yes  Other Topics Concern   Not on file  Social History Narrative   Not on file   Social Determinants of Health   Financial Resource Strain: Not on file  Food Insecurity: Not on file  Transportation Needs: Not on file  Physical Activity: Not on file  Stress: Not on file  Social Connections: Not on file  Intimate Partner Violence: Not on file    Review of Systems Per HPI  Objective:   Vitals:   12/18/21 1609  BP: 126/70  Pulse: 87  Resp: 16  Temp: 98.2 F (36.8 C)   TempSrc: Temporal  SpO2: 97%  Weight: 278 lb 6.4 oz (126.3 kg)  Height: 5\' 2"  (1.575 m)     Physical Exam Vitals reviewed.  Constitutional:      Appearance: Normal appearance. She is well-developed.  HENT:     Head: Normocephalic and atraumatic.  Eyes:     General: No visual field deficit.    Conjunctiva/sclera: Conjunctivae normal.     Pupils: Pupils are equal, round, and reactive to light.  Neck:     Vascular: No carotid bruit.  Cardiovascular:     Rate and Rhythm: Normal rate and regular rhythm.     Heart sounds: Normal heart sounds.  Pulmonary:     Effort: Pulmonary effort is normal.     Breath sounds: Normal breath sounds.  Abdominal:     Palpations: Abdomen is soft. There is no pulsatile mass.     Tenderness: There is no abdominal tenderness.  Musculoskeletal:     Right lower leg: No edema.     Left lower leg: No edema.  Skin:    General: Skin is warm and dry.  Neurological:     General: No focal deficit present.     Mental Status: She is alert and oriented to person, place, and time.     GCS: GCS eye subscore is 4. GCS verbal subscore is 5. GCS motor subscore is 6.     Cranial Nerves: No cranial nerve deficit or facial asymmetry.     Motor: No weakness, tremor or pronator drift.     Coordination: Coordination is intact. Romberg sign negative. Coordination normal. Finger-Nose-Finger Test normal.     Gait: Gait is intact.  Psychiatric:        Mood and Affect: Mood normal.        Speech: Speech normal.        Behavior: Behavior normal.       Assessment & Plan:  [name] is a 51 y.o. female . Nonintractable episodic headache, unspecified headache type  -Likely hormonal component as menses previously, minimal symptoms, fleeting symptoms, nonfocal exam.  Declines medication management at this time, RTC precautions given and can discuss further with her OB/GYN.  Screen for colon cancer - Plan: Ambulatory referral to Gastroenterology  Prediabetes - Plan:  Hemoglobin A1c  -Commended on some improvement in weight, continued weight loss with diet, activity discussed, check A1c.  No orders of the defined types were placed in this encounter.  Patient Instructions  Keep up the good work with watching diet and activity to help with weight and prediabetes.  Let me know if you have any questions.  Please have prediabetes test performed at the [facility] across from [facility] on [address].   Headaches may be hormone related as discussed, option of temporary ibuprofen or  Aleve but since they were quickly resolving may not need any medication at this time.  Can also discuss with gynecologist.  If those are increasing in frequency or changing please follow-up and we can discuss further.  Make sure to stay hydrated and see other information below on headaches.  I will refer you for colonoscopy as we discussed.   General Headache Without Cause A headache is pain or discomfort felt around the head or neck area. There are many causes and types of headaches. A few common types include: Tension headaches. Migraine headaches. Cluster headaches. Chronic daily headaches. Sometimes, the specific cause of a headache may not be found. Follow these instructions at home: Watch your condition for any changes. Let your health care provider know about them. Take these steps to help with your condition: Managing pain   Take over-the-counter and prescription medicines only as told by your health care provider. Treatment may include medicines for pain that are taken by mouth or applied to the skin. Lie down in a dark, quiet room when you have a headache. Keep lights dim if bright lights bother you or make your headaches worse. If directed, put ice on your head and neck area: Put ice in a plastic bag. Place a towel between your skin and the bag. Leave the ice on for 20 minutes, 2-3 times per day. Remove the ice if your skin turns bright red. This is very  important. If you cannot feel pain, heat, or cold, you have a greater risk of damage to the area. If directed, apply heat to the affected area. Use the heat source that your health care provider recommends, such as a moist heat pack or a heating pad. Place a towel between your skin and the heat source. Leave the heat on for 20-30 minutes. Remove the heat if your skin turns bright red. This is especially important if you are unable to feel pain, heat, or cold. You have a greater risk of getting burned. Eating and drinking Eat meals on a regular schedule. If you drink alcohol: Limit how much you have to: 0-1 drink a day for women who are not pregnant. 0-2 drinks a day for men. Know how much alcohol is in a drink. In the [place] one drink equals one 12 oz bottle of beer (355 mL), one 5 oz glass of wine (148 mL), or one 1 oz glass of hard liquor (44 mL). Stop drinking caffeine, or decrease the amount of caffeine you drink. Drink enough fluid to keep your urine pale yellow. General instructions  Keep a headache journal to help find out what may trigger your headaches. For example, write down: What you eat and drink. How much sleep you get. Any change to your diet or medicines. Try massage or other relaxation techniques. Limit stress. Sit up straight, and do not tense your muscles. Do not use any products that contain nicotine or tobacco. These products include cigarettes, chewing tobacco, and vaping devices, such as e-cigarettes. If you need help quitting, ask your health care provider. Exercise regularly as told by your health care provider. Sleep on a regular schedule. Get 7-9 hours of sleep each night, or the amount recommended by your health care provider. Keep all follow-up visits. This is important. Contact a health care provider if: Medicine does not help your symptoms. You have a headache that is different from your usual headache. You have nausea or you vomit. You have a fever. Get  help right away if: Your headache:  Becomes severe quickly. Gets worse after moderate to intense physical activity. You have any of these symptoms: Repeated vomiting. Pain or stiffness in your neck. Changes to your vision. Pain in an eye or ear. Problems with speech. Muscular weakness or loss of muscle control. Loss of balance or coordination. You feel faint or pass out. You have confusion. You have a seizure. These symptoms may represent a serious problem that is an emergency. Do not wait to see if the symptoms will go away. Get medical help right away. Call your local emergency services (911 in the [place] Do not drive yourself to the hospital. Summary A headache is pain or discomfort felt around the head or neck area. There are many causes and types of headaches. In some cases, the cause may not be found. Keep a headache journal to help find out what may trigger your headaches. Watch your condition for any changes. Let your health care provider know about them. Contact a health care provider if you have a headache that is different from the usual headache, or if your symptoms are not helped by medicine. Get help right away if your headache becomes severe, you vomit, you have a loss of vision, you lose your balance, or you have a seizure. This information is not intended to replace advice given to you by your health care provider. Make sure you discuss any questions you have with your health care provider. Document Revised: 04/05/2021 Document Reviewed: 04/05/2021 Elsevier Patient Education  2022 [place],   [name], MD [place], [facility] 12/18/21 4:40 PM

## 2021-12-19 ENCOUNTER — Encounter: Payer: Self-pay | Admitting: Family Medicine

## 2021-12-20 ENCOUNTER — Other Ambulatory Visit (INDEPENDENT_AMBULATORY_CARE_PROVIDER_SITE_OTHER): Payer: 59

## 2021-12-20 DIAGNOSIS — R7303 Prediabetes: Secondary | ICD-10-CM | POA: Diagnosis not present

## 2021-12-20 LAB — HEMOGLOBIN A1C: Hgb A1c MFr Bld: 5.9 % (ref 4.6–6.5)

## 2022-01-02 ENCOUNTER — Encounter: Payer: Self-pay | Admitting: Gastroenterology

## 2022-01-25 ENCOUNTER — Ambulatory Visit: Payer: 59 | Admitting: Gastroenterology

## 2022-02-13 ENCOUNTER — Encounter: Payer: Self-pay | Admitting: Gastroenterology

## 2022-02-13 ENCOUNTER — Ambulatory Visit (INDEPENDENT_AMBULATORY_CARE_PROVIDER_SITE_OTHER): Payer: 59 | Admitting: Gastroenterology

## 2022-02-13 VITALS — BP 162/90 | HR 91 | Ht 62.0 in | Wt 287.2 lb

## 2022-02-13 DIAGNOSIS — Z1212 Encounter for screening for malignant neoplasm of rectum: Secondary | ICD-10-CM | POA: Diagnosis not present

## 2022-02-13 DIAGNOSIS — Z1211 Encounter for screening for malignant neoplasm of colon: Secondary | ICD-10-CM | POA: Diagnosis not present

## 2022-02-13 NOTE — Progress Notes (Signed)
? ?HPI : [name] is a very pleasant 51 year old female with morbid obesity who is referred for initial average risk screening colonoscopy.  She has no family history of colon cancer and no chronic GI symptoms.  Specifically, she denies abdominal pain, constipation, diarrhea or blood in the stool.  She has a daily bowel movement with soft, formed brown stool.   ?She also denies any chronic upper GI symptoms such as frequent heartburn, acid regurgitation, dysphagia, nausea/vomiting, dyspepsia, bloating. ? ?She has a history of iron deficiency anemia secondary to uterine bleeding and underwent Novasure ablation in 2014 with improvement in her bleeding and iron deficiency.   Her last hemoglobin was 12.9 in 2020. ? ?She denies any cardiopulmonary comorbidities and denies symptoms of chest pain, shortness of breath, palpitations, LH/dizziness. ? ? ?Past Medical History:  ?Diagnosis Date  ? Anemia   ? Headache(784.0)   ? PONV (postoperative nausea and vomiting)   ? ? ? ?Past Surgical History:  ?Procedure Laterality Date  ? [place] OF UTERUS  2000  ? DILITATION & CURRETTAGE/HYSTROSCOPY WITH NOVASURE ABLATION N/A 02/05/2013  ? Procedure: DILATATION & CURETTAGE/HYSTEROSCOPY WITH NOVASURE ABLATION;  Surgeon: Margarette Asal, MD;  Location: [facility];  Service: Gynecology;  Laterality: N/A;  ? LAPAROSCOPIC TUBAL LIGATION Bilateral 02/05/2013  ? Procedure: LAPAROSCOPIC TUBAL LIGATION;  Surgeon: Margarette Asal, MD;  Location: [facility];  Service: Gynecology;  Laterality: Bilateral;  attempted tubal ligation  ? ?Family History  ?Problem Relation Age of Onset  ? Cancer Sister   ? Breast cancer Sister   ? Colon cancer Neg Hx   ? Esophageal cancer Neg Hx   ? Pancreatic cancer Neg Hx   ? Stomach cancer Neg Hx   ? Heart disease Neg Hx   ? Diabetes Neg Hx   ? ?Social History  ? ?Tobacco Use  ? Smoking status: Never  ? Smokeless tobacco: Never  ?Vaping Use  ? Vaping Use: Never used  ?Substance Use Topics  ? Alcohol use:  Yes  ?  Comment: occas  ? Drug use: No  ? ?Current Outpatient Medications  ?Medication Sig Dispense Refill  ? Ascorbic Acid (VITAMIN C PO) Take 1 tablet by mouth daily.    ? fluticasone (FLONASE) 50 MCG/ACT nasal spray Place 1-2 sprays into both nostrils daily. 16 g 6  ? ibuprofen (ADVIL) 800 MG tablet ibuprofen 800 mg tablet    ? Multiple Vitamins-Minerals (MULTIVITAMIN ADULT EXTRA C PO) multivitamin    ? ?No current facility-administered medications for this visit.  ? ?No Known Allergies ? ? ?Review of Systems: ?All systems reviewed and negative except where noted in HPI.  ? ? ?No results found. ? ?Physical Exam: ?BP (!) 162/90   Pulse 91   Ht '5\' 2"'$  (1.575 m)   Wt 287 lb 4 oz (130.3 kg)   BMI 52.54 kg/m?  ?Constitutional: Pleasant,well-developed, mobidly obese African-born female in no acute distress. ?HEENT: Normocephalic and atraumatic. Conjunctivae are normal. No scleral icterus. ?Neck supple.  ?Cardiovascular: Normal rate, regular rhythm.  ?Pulmonary/chest: Effort normal and breath sounds normal. No wheezing, rales or rhonchi. ?Abdominal: Soft, nondistended, nontender. Obese, protuberant.  Bowel sounds active throughout. There are no masses palpable. No hepatomegaly. ?Extremities: no edema ?Neurological: Alert and oriented to person place and time. ?Skin: Skin is warm and dry. No rashes noted. ?Psychiatric: Normal mood and affect. Behavior is normal. ? ?CBC ?   ?Component Value Date/Time  ? WBC 4.5 01/21/2019 1050  ? WBC 6.4 02/13/2016  1845  ? RBC 5.16 01/21/2019 1050  ? RBC 4.82 02/13/2016 1845  ? HGB 12.9 01/21/2019 1050  ? HCT 38.8 01/21/2019 1050  ? PLT 272 01/21/2019 1050  ? MCV 75 (L) 01/21/2019 1050  ? MCH 25.0 (L) 01/21/2019 1050  ? MCH 23.0 (L) 02/13/2016 1845  ? MCHC 33.2 01/21/2019 1050  ? MCHC 32.3 02/13/2016 1845  ? RDW 17.2 (H) 01/21/2019 1050  ? ? ?CMP  ?   ?Component Value Date/Time  ? NA 140 04/13/2021 0949  ? NA 141 02/29/2020 0940  ? K 4.1 04/13/2021 0949  ? CL 101 04/13/2021 0949  ? CO2  32 04/13/2021 0949  ? GLUCOSE 96 04/13/2021 0949  ? BUN 11 04/13/2021 0949  ? BUN 13 02/29/2020 0940  ? CREATININE 0.70 04/13/2021 0949  ? CREATININE 0.61 10/19/2015 1052  ? CALCIUM 9.5 04/13/2021 0949  ? PROT 7.3 04/13/2021 0949  ? PROT 6.8 02/29/2020 0940  ? ALBUMIN 4.5 04/13/2021 0949  ? ALBUMIN 4.3 02/29/2020 0940  ? AST 18 04/13/2021 0949  ? ALT 19 04/13/2021 0949  ? ALKPHOS 72 04/13/2021 0949  ? BILITOT 0.4 04/13/2021 0949  ? BILITOT 0.2 02/29/2020 0940  ? GFRNONAA 90 02/29/2020 0940  ? GFRNONAA >89 10/19/2015 1052  ? GFRAA 103 02/29/2020 0940  ? GFRAA >89 10/19/2015 1052  ? ? ? ?ASSESSMENT AND PLAN: ?51 year old female due for initial average risk screening colonoscopy.  She has no chronic GI symptoms and no family history of colon cancer.  Due to her BMI >50, she is high risk and her procedure will need to be performed at [facility].  She is aware of the lack of available hospital endoscopy slots available and the anticipated long wait time (likely many months) before a slot is available.  She will be added to the list of patients needing a hospital endoscopy slot and will be contacted when a slot is available. ? ?CRC screening ?- Colonoscopy in hospital setting due to BMI ? ?The details, risks (including bleeding, perforation, infection, missed lesions, medication reactions and possible hospitalization or surgery if complications occur), benefits, and alternatives to colonoscopy with possible biopsy and possible polypectomy were discussed with the patient and she consents to proceed.  ? ?Lynleigh Kovack E. Candis Schatz, MD ?[facility] ? ?[name], MD ? ?

## 2022-02-13 NOTE — Patient Instructions (Signed)
If you are age 52 or older, your body mass index should be between 23-30. Your Body mass index is 52.54 kg/m?Marland Kitchen If this is out of the aforementioned range listed, please consider follow up with your Primary Care Provider. ? ?If you are age 11 or younger, your body mass index should be between 19-25. Your Body mass index is 52.54 kg/m?Marland Kitchen If this is out of the aformentioned range listed, please consider follow up with your Primary Care Provider.  ? ? ?We will call you to schedule Screening colonoscopy at the hospital. ? ?The [facility] providers would like to encourage you to use [facility] to communicate with providers for non-urgent requests or questions.  Due to long hold times on the telephone, sending your provider a message by [facility] may be a faster and more efficient way to get a response.  Please allow 48 business hours for a response.  Please remember that this is for non-urgent requests.  ? ?It was a pleasure to see you today! ? ?Thank you for trusting me with your gastrointestinal care!   ? ?Scott E.Candis Schatz, MD  ? ?

## 2022-04-02 DIAGNOSIS — Z1231 Encounter for screening mammogram for malignant neoplasm of breast: Secondary | ICD-10-CM | POA: Diagnosis not present

## 2022-04-02 DIAGNOSIS — Z124 Encounter for screening for malignant neoplasm of cervix: Secondary | ICD-10-CM | POA: Diagnosis not present

## 2022-04-02 DIAGNOSIS — Z01419 Encounter for gynecological examination (general) (routine) without abnormal findings: Secondary | ICD-10-CM | POA: Diagnosis not present

## 2022-04-02 DIAGNOSIS — Z1151 Encounter for screening for human papillomavirus (HPV): Secondary | ICD-10-CM | POA: Diagnosis not present

## 2022-04-02 DIAGNOSIS — Z6841 Body Mass Index (BMI) 40.0 and over, adult: Secondary | ICD-10-CM | POA: Diagnosis not present

## 2022-04-02 LAB — RESULTS CONSOLE HPV: CHL HPV: NEGATIVE

## 2022-04-03 LAB — HM PAP SMEAR
HM Pap smear: NORMAL
HPV, high-risk: NEGATIVE

## 2022-04-09 ENCOUNTER — Other Ambulatory Visit (HOSPITAL_COMMUNITY): Payer: Self-pay

## 2022-04-09 MED ORDER — BLOOD PRESSURE MONITOR 3 DEVI
0 refills | Status: DC
Start: 2022-04-09 — End: 2024-08-03
  Filled 2022-04-09: qty 1, 30d supply, fill #0

## 2022-04-18 ENCOUNTER — Encounter: Payer: 59 | Admitting: Family Medicine

## 2022-04-19 ENCOUNTER — Telehealth: Payer: Self-pay

## 2022-04-19 ENCOUNTER — Encounter: Payer: Self-pay | Admitting: Family Medicine

## 2022-04-19 ENCOUNTER — Ambulatory Visit (INDEPENDENT_AMBULATORY_CARE_PROVIDER_SITE_OTHER): Payer: 59 | Admitting: Family Medicine

## 2022-04-19 VITALS — BP 138/88 | HR 88 | Temp 98.9°F | Resp 16 | Ht 62.0 in | Wt 286.6 lb

## 2022-04-19 DIAGNOSIS — R030 Elevated blood-pressure reading, without diagnosis of hypertension: Secondary | ICD-10-CM | POA: Diagnosis not present

## 2022-04-19 DIAGNOSIS — Z23 Encounter for immunization: Secondary | ICD-10-CM

## 2022-04-19 DIAGNOSIS — Z13220 Encounter for screening for lipoid disorders: Secondary | ICD-10-CM

## 2022-04-19 DIAGNOSIS — Z0000 Encounter for general adult medical examination without abnormal findings: Secondary | ICD-10-CM

## 2022-04-19 DIAGNOSIS — R7303 Prediabetes: Secondary | ICD-10-CM

## 2022-04-19 DIAGNOSIS — Z6841 Body Mass Index (BMI) 40.0 and over, adult: Secondary | ICD-10-CM | POA: Diagnosis not present

## 2022-04-19 LAB — LIPID PANEL
Cholesterol: 156 mg/dL (ref 0–200)
HDL: 46.7 mg/dL (ref >39.00)
LDL Cholesterol: 91 mg/dL (ref 0–99)
NonHDL: 109.56
Total CHOL/HDL Ratio: 3
Triglycerides: 92 mg/dL (ref 0.0–149.0)
VLDL: 18.4 mg/dL (ref 0.0–40.0)

## 2022-04-19 LAB — COMPREHENSIVE METABOLIC PANEL
ALT: 18 U/L (ref 0–35)
AST: 19 U/L (ref 0–37)
Albumin: 4.3 g/dL (ref 3.5–5.2)
Alkaline Phosphatase: 69 U/L (ref 39–117)
BUN: 13 mg/dL (ref 6–23)
CO2: 30 mEq/L (ref 19–32)
Calcium: 9.3 mg/dL (ref 8.4–10.5)
Chloride: 100 mEq/L (ref 96–112)
Creatinine, Ser: 0.68 mg/dL (ref 0.40–1.20)
GFR: 100.56 mL/min (ref >60.00)
Glucose, Bld: 91 mg/dL (ref 70–99)
Potassium: 4.1 mEq/L (ref 3.5–5.1)
Sodium: 138 mEq/L (ref 135–145)
Total Bilirubin: 0.4 mg/dL (ref 0.2–1.2)
Total Protein: 7.1 g/dL (ref 6.0–8.3)

## 2022-04-19 LAB — TSH: TSH: 3.44 u[IU]/mL (ref 0.35–5.50)

## 2022-04-19 NOTE — Progress Notes (Signed)
Subjective:  Patient ID: [name], female    DOB: 04/30/1971  Age: 51 y.o. MRN: [id number]  CC:  Chief Complaint  Patient presents with   Annual Exam    Pt presents for her annual physical. Pt has no concerns.    HPI [name] presents for Annual Exam  No current rx meds.   GYN: Dr. Matthew Saras.   Concern of elevated BP: 158/90 at gyn on 5/15. Rechecked on other arm - 150/90? No hx of HTN.  Home monitoring: 120/80.  Constitutional: Negative for fatigue and unexpected weight change.  Eyes: Negative for visual disturbance.  Respiratory: Negative for cough, chest tightness and shortness of breath.   Cardiovascular: Negative for chest pain, palpitations and leg swelling.  Gastrointestinal: Negative for abdominal pain and blood in stool.  Neurological: Negative for dizziness, light-headedness and headaches.  Some added salt to foods. No frozen foods.  BP Readings from Last 3 Encounters:  04/19/22 138/88  02/13/22 (!) 162/90  12/18/21 126/70   Obesity: With prediabetes. Walking at times 3 days per week - 15-20 min.  Eating better. No fast food. Only rare soda, no sugar beverages.  Wt Readings from Last 3 Encounters:  04/19/22 286 lb 9.6 oz (130 kg)  02/13/22 287 lb 4 oz (130.3 kg)  12/18/21 278 lb 6.4 oz (126.3 kg)   Lab Results  Component Value Date   HGBA1C 5.9 12/20/2021         04/19/2022    9:13 AM 04/19/2022    9:11 AM 04/13/2021   10:40 AM 02/29/2020    8:41 AM 01/21/2019    9:26 AM  Depression screen PHQ 2/9  Decreased Interest 0 1 1 0 0  Down, Depressed, Hopeless 0 0 1 0 0  PHQ - 2 Score 0 1 2 0 0  Altered sleeping  0 1    Tired, decreased energy  1 1    Change in appetite  0 1    Feeling bad or failure about yourself   0 0    Trouble concentrating  0 0    Moving slowly or fidgety/restless  0 0    Suicidal thoughts  0 0    PHQ-9 Score  2 5    Difficult doing work/chores Not difficult at all Not difficult at all       Health Maintenance  Topic  Date Due   Zoster Vaccines- Shingrix (2 of 2) 06/08/2021   PAP SMEAR-Modifier  04/19/2022 (Originally 10/22/2021)   COVID-19 Vaccine (4 - Booster for [place] series) 05/05/2022 (Originally 11/11/2020)   COLONOSCOPY (Pts 45-37yr Insurance coverage will need to be confirmed)  04/20/2023 (Originally 06/28/2015)   INFLUENZA VACCINE  06/19/2022   MAMMOGRAM  03/24/2023   TETANUS/TDAP  11/09/2027   Hepatitis C Screening  Completed   HIV Screening  Completed   HPV VACCINES  Aged Out  Colon: saw GI - scheduling colonoscopy.  Pap and mammogram 5/15 - GYN.   Immunization History  Administered Date(s) Administered   Influenza,inj,Quad PF,6+ Mos 07/20/2016   Influenza-Unspecified 08/01/2021   PFIZER(Purple Top)SARS-COV-2 Vaccination 11/24/2019, 12/14/2019, 09/16/2020   Tdap 11/08/2017   Zoster Recombinat (Shingrix) 04/13/2021  Shingrix - repeat today.  Covid booster - recommended bivalent booster. No infection.   Optho 09/2021. Glasses. No concerns.  No results found.  Dental:Yes and Within Last 6 months - 2 weeks ago.   Alcohol: 1-2 every few months.  Tobacco: none.   Exercise: as above  -walking.    History  There are no problems to display for this patient.  Past Medical History:  Diagnosis Date   Anemia    Headache(784.0)    PONV (postoperative nausea and vomiting)    Past Surgical History:  Procedure Laterality Date   DILATION AND CURETTAGE OF UTERUS  2000   DILITATION & CURRETTAGE/HYSTROSCOPY WITH NOVASURE ABLATION N/A 02/05/2013   Procedure: DILATATION & CURETTAGE/HYSTEROSCOPY WITH NOVASURE ABLATION;  Surgeon: Margarette Asal, MD;  Location: [facility];  Service: Gynecology;  Laterality: N/A;   LAPAROSCOPIC TUBAL LIGATION Bilateral 02/05/2013   Procedure: LAPAROSCOPIC TUBAL LIGATION;  Surgeon: Margarette Asal, MD;  Location: [facility];  Service: Gynecology;  Laterality: Bilateral;  attempted tubal ligation   No Known Allergies Prior to Admission medications   Medication Sig  Start Date End Date Taking? Authorizing Provider  Ascorbic Acid (VITAMIN C PO) Take 1 tablet by mouth daily.   Yes [provider]  Blood Pressure Monitoring (BLOOD PRESSURE MONITOR 3) DEVI Use as directed. 04/09/22  Yes   fluticasone (FLONASE) 50 MCG/ACT nasal spray Place 1-2 sprays into both nostrils daily. 04/13/21  Yes [name], MD  ibuprofen (ADVIL) 800 MG tablet ibuprofen 800 mg tablet   Yes [provider]  Multiple Vitamins-Minerals (MULTIVITAMIN ADULT EXTRA C PO) multivitamin   Yes [provider]   Social History   Socioeconomic History   Marital status: Married    Spouse name: Not on file   Number of children: Not on file   Years of education: Not on file   Highest education level: Not on file  Occupational History   Not on file  Tobacco Use   Smoking status: Never   Smokeless tobacco: Never  Vaping Use   Vaping Use: Never used  Substance and Sexual Activity   Alcohol use: Yes    Comment: occas   Drug use: No   Sexual activity: Yes  Other Topics Concern   Not on file  Social History Narrative   Not on file   Social Determinants of Health   Financial Resource Strain: Not on file  Food Insecurity: Not on file  Transportation Needs: Not on file  Physical Activity: Not on file  Stress: Not on file  Social Connections: Not on file  Intimate Partner Violence: Not on file    Review of Systems 13 point review of systems per patient health survey noted.  Negative other than as indicated above or in HPI.    Objective:   Vitals:   04/19/22 0915  BP: 138/88  Pulse: 88  Resp: 16  Temp: 98.9 F (37.2 C)  TempSrc: Temporal  SpO2: 98%  Weight: 286 lb 9.6 oz (130 kg)  Height: '5\' 2"'  (1.575 m)     Physical Exam Vitals reviewed.  Constitutional:      Appearance: She is well-developed. She is obese.  HENT:     Head: Normocephalic and atraumatic.     Right Ear: External ear normal.     Left Ear: External ear normal.  Eyes:      Conjunctiva/sclera: Conjunctivae normal.     Pupils: Pupils are equal, round, and reactive to light.  Neck:     Thyroid: No thyromegaly.  Cardiovascular:     Rate and Rhythm: Normal rate and regular rhythm.     Heart sounds: Normal heart sounds. No murmur heard. Pulmonary:     Effort: Pulmonary effort is normal. No respiratory distress.     Breath sounds: Normal breath sounds. No wheezing.  Abdominal:  General: Bowel sounds are normal.     Palpations: Abdomen is soft.     Tenderness: There is no abdominal tenderness.  Musculoskeletal:        General: No tenderness. Normal range of motion.     Cervical back: Normal range of motion and neck supple.     Right lower leg: No edema.     Left lower leg: No edema.  Lymphadenopathy:     Cervical: No cervical adenopathy.  Skin:    General: Skin is warm and dry.     Findings: No rash.  Neurological:     Mental Status: She is alert and oriented to person, place, and time.  Psychiatric:        Behavior: Behavior normal.        Thought Content: Thought content normal.       Assessment & Plan:  [name] is a 51 y.o. female . Annual physical exam  - -anticipatory guidance as below in AVS, screening labs above. Health maintenance items as above in HPI discussed/recommended as applicable.   Blood pressure elevated without history of HTN - Plan: Comprehensive metabolic panel, TSH  -Borderline in office.  Check labs above, decrease sodium in diet, handout given on monitoring blood pressure with recheck in 2 months.  Sooner if elevated readings at home.  Low intensity exercise with increased walking discussed.  Prediabetes  -Plan for repeat A1c in 2 months.  Increased walking for exercise, continue to watch diet.  Class 3 severe obesity without serious comorbidity with body mass index (BMI) of 50.0 to 59.9 in adult, unspecified obesity type ([place])  -As above, increase exercise, commended on improved diet.  Screening for  hyperlipidemia - Plan: Comprehensive metabolic panel, Lipid panel  Need for shingles vaccine - Plan: Varicella-zoster vaccine IM (Shingrix) Second dose given.  Also recommended COVID bivalent vaccine.  No orders of the defined types were placed in this encounter.  Patient Instructions  Blood pressure borderline.  Try to avoid salt in food and see handout on higher sodium foods. Keep a record of your blood pressures outside of the office and bring them to the next office visit in next 2 months. Sooner if elevated.   Try to add more walking with goal of 118mn of exercise per week spread out over most days.   I recommend covid bivalent booster at your pharmacy.    Preventive Care [id number] Old, Female Preventive care refers to lifestyle choices and visits with your health care provider that can promote health and wellness. Preventive care visits are also called wellness exams. What can I expect for my preventive care visit? Counseling Your health care provider may ask you questions about your: Medical history, including: Past medical problems. Family medical history. Pregnancy history. Current health, including: Menstrual cycle. Method of birth control. Emotional well-being. Home life and relationship well-being. Sexual activity and sexual health. Lifestyle, including: Alcohol, nicotine or tobacco, and drug use. Access to firearms. Diet, exercise, and sleep habits. Work and work [employer] Sun[employer] use. Safety issues such as seatbelt and bike helmet use. Physical exam Your health care provider will check your: Height and weight. These may be used to calculate your BMI (body mass index). BMI is a measurement that tells if you are at a healthy weight. Waist circumference. This measures the distance around your waistline. This measurement also tells if you are at a healthy weight and may help predict your risk of certain diseases, such as type 2 diabetes and high  blood  pressure. Heart rate and blood pressure. Body temperature. Skin for abnormal spots. What immunizations do I need?  Vaccines are usually given at various ages, according to a schedule. Your health care provider will recommend vaccines for you based on your age, medical history, and lifestyle or other factors, such as travel or where you work. What tests do I need? Screening Your health care provider may recommend screening tests for certain conditions. This may include: Lipid and cholesterol levels. Diabetes screening. This is done by checking your blood sugar (glucose) after you have not eaten for a while (fasting). Pelvic exam and Pap test. Hepatitis B test. Hepatitis C test. HIV (human immunodeficiency virus) test. STI (sexually transmitted infection) testing, if you are at risk. Lung cancer screening. Colorectal cancer screening. Mammogram. Talk with your health care provider about when you should start having regular mammograms. This may depend on whether you have a family history of breast cancer. BRCA-related cancer screening. This may be done if you have a family history of breast, ovarian, tubal, or peritoneal cancers. Bone density scan. This is done to screen for osteoporosis. Talk with your health care provider about your test results, treatment options, and if necessary, the need for more tests. Follow these instructions at home: Eating and drinking  Eat a diet that includes fresh fruits and vegetables, whole grains, lean protein, and low-fat dairy products. Take vitamin and mineral supplements as recommended by your health care provider. Do not drink alcohol if: Your health care provider tells you not to drink. You are pregnant, may be pregnant, or are planning to become pregnant. If you drink alcohol: Limit how much you have to 0-1 drink a day. Know how much alcohol is in your drink. In the [place] one drink equals one 12 oz bottle of beer (355 mL), one 5 oz glass of wine  (148 mL), or one 1 oz glass of hard liquor (44 mL). Lifestyle Brush your teeth every morning and night with fluoride toothpaste. Floss one time each day. Exercise for at least 30 minutes 5 or more days each week. Do not use any products that contain nicotine or tobacco. These products include cigarettes, chewing tobacco, and vaping devices, such as e-cigarettes. If you need help quitting, ask your health care provider. Do not use drugs. If you are sexually active, practice safe sex. Use a condom or other form of protection to prevent STIs. If you do not wish to become pregnant, use a form of birth control. If you plan to become pregnant, see your health care provider for a prepregnancy visit. Take aspirin only as told by your health care provider. Make sure that you understand how much to take and what form to take. Work with your health care provider to find out whether it is safe and beneficial for you to take aspirin daily. Find healthy ways to manage stress, such as: Meditation, yoga, or listening to music. Journaling. Talking to a trusted person. Spending time with friends and family. Minimize exposure to UV radiation to reduce your risk of skin cancer. Safety Always wear your seat belt while driving or riding in a vehicle. Do not drive: If you have been drinking alcohol. Do not ride with someone who has been drinking. When you are tired or distracted. While texting. If you have been using any mind-altering substances or drugs. Wear a helmet and other protective equipment during sports activities. If you have firearms in your house, make sure you follow all gun safety procedures.  Seek help if you have been physically or sexually abused. What's next? Visit your health care provider once a year for an annual wellness visit. Ask your health care provider how often you should have your eyes and teeth checked. Stay up to date on all vaccines. This information is not intended to replace  advice given to you by your health care provider. Make sure you discuss any questions you have with your health care provider. Document Revised: 05/03/2021 Document Reviewed: 05/03/2021 Elsevier Patient Education  [place].   How to Take Your Blood Pressure Blood pressure is a measurement of how strongly your blood is pressing against the walls of your arteries. Arteries are blood vessels that carry blood from your heart throughout your body. Your health care provider takes your blood pressure at each office visit. You can also take your own blood pressure at home with a blood pressure monitor. You may need to take your own blood pressure to: Confirm a diagnosis of high blood pressure (hypertension). Monitor your blood pressure over time. Make sure your blood pressure medicine is working. Supplies needed: Blood pressure monitor. A chair to sit in. This should be a chair where you can sit upright with your back supported. Do not sit on a soft couch or an armchair. Table or desk. Small notebook and pencil or pen. How to prepare To get the most accurate reading, avoid the following for 30 minutes before you check your blood pressure: Drinking caffeine. Drinking alcohol. Eating. Smoking. Exercising. Five minutes before you check your blood pressure: Use the bathroom and urinate so that you have an empty bladder. Sit quietly in a chair. Do not talk. How to take your blood pressure To check your blood pressure, follow the instructions in the manual that came with your blood pressure monitor. If you have a digital blood pressure monitor, the instructions may be as follows: Sit up straight in a chair. Place your feet on the floor. Do not cross your ankles or legs. Rest your left arm at the level of your heart on a table or desk or on the arm of a chair. Pull up your shirt sleeve. Wrap the blood pressure cuff around the upper part of your left arm, 1 inch (2.5 cm) above your elbow.  It is best to wrap the cuff around bare skin. Fit the cuff snugly, but not too tightly, around your arm. You should be able to place only one finger between the cuff and your arm. Position the cord so that it rests in the bend of your elbow. Press the power button. Sit quietly while the cuff inflates and deflates. Read the digital reading on the monitor screen and write the numbers down (record them) in a notebook. Wait 2-3 minutes, then repeat the steps, starting at step 1. What does my blood pressure reading mean? A blood pressure reading consists of a higher number over a lower number. Ideally, your blood pressure should be below 120/80. The first ("top") number is called the systolic pressure. It is a measure of the pressure in your arteries as your heart beats. The second ("bottom") number is called the diastolic pressure. It is a measure of the pressure in your arteries as the heart relaxes. Blood pressure is classified into four stages. The following are the stages for adults who do not have a short-term serious illness or a chronic condition. Systolic pressure and diastolic pressure are measured in a unit called mm Hg (millimeters of mercury).  Normal  Systolic pressure: below 427. Diastolic pressure: below 80. Elevated Systolic pressure: 062-376. Diastolic pressure: below 80. Hypertension stage 1 Systolic pressure: 283-151. Diastolic pressure: 76-16. Hypertension stage 2 Systolic pressure: 073 or above. Diastolic pressure: 90 or above. You can have elevated blood pressure or hypertension even if only the systolic or only the diastolic number in your reading is higher than normal. Follow these instructions at home: Medicines Take over-the-counter and prescription medicines only as told by your health care provider. Tell your health care provider if you are having any side effects from blood pressure medicine. General instructions Check your blood pressure as often as recommended by  your health care provider. Check your blood pressure at the same time every day. Take your monitor to the next appointment with your health care provider to make sure that: You are using it correctly. It provides accurate readings. Understand what your goal blood pressure numbers are. Keep all follow-up visits. This is important. General tips Your health care provider can suggest a reliable monitor that will meet your needs. There are several types of home blood pressure monitors. Choose a monitor that has an arm cuff. Do not choose a monitor that measures your blood pressure from your wrist or finger. Choose a cuff that wraps snugly, not too tight or too loose, around your upper arm. You should be able to fit only one finger between your arm and the cuff. You can buy a blood pressure monitor at most drugstores or online. Where to find more information American Heart Association: www.heart.org Contact a health care provider if: Your blood pressure is consistently high. Your blood pressure is suddenly low. Get help right away if: Your systolic blood pressure is higher than 180. Your diastolic blood pressure is higher than 120. These symptoms may be an emergency. Get help right away. Call 911. Do not wait to see if the symptoms will go away. Do not drive yourself to the hospital. Summary Blood pressure is a measurement of how strongly your blood is pressing against the walls of your arteries. A blood pressure reading consists of a higher number over a lower number. Ideally, your blood pressure should be below 120/80. Check your blood pressure at the same time every day. Avoid caffeine, alcohol, smoking, and exercise for 30 minutes prior to checking your blood pressure. These agents can affect the accuracy of the blood pressure reading. This information is not intended to replace advice given to you by your health care provider. Make sure you discuss any questions you have with your health  care provider. Document Revised: 07/20/2021 Document Reviewed: 07/20/2021 Elsevier Patient Education  [place],   [name], MD [facility] 04/19/22 10:20 AM

## 2022-04-19 NOTE — Patient Instructions (Addendum)
Blood pressure borderline.  Try to avoid salt in food and see handout on higher sodium foods. Keep a record of your blood pressures outside of the office and bring them to the next office visit in next 2 months. Sooner if elevated.   Try to add more walking with goal of 132mn of exercise per week spread out over most days.   I recommend covid bivalent booster at your pharmacy.    Preventive Care [id number] Old, Female Preventive care refers to lifestyle choices and visits with your health care provider that can promote health and wellness. Preventive care visits are also called wellness exams. What can I expect for my preventive care visit? Counseling Your health care provider may ask you questions about your: Medical history, including: Past medical problems. Family medical history. Pregnancy history. Current health, including: Menstrual cycle. Method of birth control. Emotional well-being. Home life and relationship well-being. Sexual activity and sexual health. Lifestyle, including: Alcohol, nicotine or tobacco, and drug use. Access to firearms. Diet, exercise, and sleep habits. Work and work [employer] use. Safety issues such as seatbelt and bike helmet use. Physical exam Your health care provider will check your: Height and weight. These may be used to calculate your BMI (body mass index). BMI is a measurement that tells if you are at a healthy weight. Waist circumference. This measures the distance around your waistline. This measurement also tells if you are at a healthy weight and may help predict your risk of certain diseases, such as type 2 diabetes and high blood pressure. Heart rate and blood pressure. Body temperature. Skin for abnormal spots. What immunizations do I need?  Vaccines are usually given at various ages, according to a schedule. Your health care provider will recommend vaccines for you based on your age, medical history, and lifestyle or  other factors, such as travel or where you work. What tests do I need? Screening Your health care provider may recommend screening tests for certain conditions. This may include: Lipid and cholesterol levels. Diabetes screening. This is done by checking your blood sugar (glucose) after you have not eaten for a while (fasting). Pelvic exam and Pap test. Hepatitis B test. Hepatitis C test. HIV (human immunodeficiency virus) test. STI (sexually transmitted infection) testing, if you are at risk. Lung cancer screening. Colorectal cancer screening. Mammogram. Talk with your health care provider about when you should start having regular mammograms. This may depend on whether you have a family history of breast cancer. BRCA-related cancer screening. This may be done if you have a family history of breast, ovarian, tubal, or peritoneal cancers. Bone density scan. This is done to screen for osteoporosis. Talk with your health care provider about your test results, treatment options, and if necessary, the need for more tests. Follow these instructions at home: Eating and drinking  Eat a diet that includes fresh fruits and vegetables, whole grains, lean protein, and low-fat dairy products. Take vitamin and mineral supplements as recommended by your health care provider. Do not drink alcohol if: Your health care provider tells you not to drink. You are pregnant, may be pregnant, or are planning to become pregnant. If you drink alcohol: Limit how much you have to 0-1 drink a day. Know how much alcohol is in your drink. In the [place] one drink equals one 12 oz bottle of beer (355 mL), one 5 oz glass of wine (148 mL), or one 1 oz glass of hard liquor (44 mL). Lifestyle Brush your teeth every  morning and night with fluoride toothpaste. Floss one time each day. Exercise for at least 30 minutes 5 or more days each week. Do not use any products that contain nicotine or tobacco. These products include  cigarettes, chewing tobacco, and vaping devices, such as e-cigarettes. If you need help quitting, ask your health care provider. Do not use drugs. If you are sexually active, practice safe sex. Use a condom or other form of protection to prevent STIs. If you do not wish to become pregnant, use a form of birth control. If you plan to become pregnant, see your health care provider for a prepregnancy visit. Take aspirin only as told by your health care provider. Make sure that you understand how much to take and what form to take. Work with your health care provider to find out whether it is safe and beneficial for you to take aspirin daily. Find healthy ways to manage stress, such as: Meditation, yoga, or listening to music. Journaling. Talking to a trusted person. Spending time with friends and family. Minimize exposure to UV radiation to reduce your risk of skin cancer. Safety Always wear your seat belt while driving or riding in a vehicle. Do not drive: If you have been drinking alcohol. Do not ride with someone who has been drinking. When you are tired or distracted. While texting. If you have been using any mind-altering substances or drugs. Wear a helmet and other protective equipment during sports activities. If you have firearms in your house, make sure you follow all gun safety procedures. Seek help if you have been physically or sexually abused. What's next? Visit your health care provider once a year for an annual wellness visit. Ask your health care provider how often you should have your eyes and teeth checked. Stay up to date on all vaccines. This information is not intended to replace advice given to you by your health care provider. Make sure you discuss any questions you have with your health care provider. Document Revised: 05/03/2021 Document Reviewed: 05/03/2021 Elsevier Patient Education  [place].   How to Take Your Blood Pressure Blood pressure is a  measurement of how strongly your blood is pressing against the walls of your arteries. Arteries are blood vessels that carry blood from your heart throughout your body. Your health care provider takes your blood pressure at each office visit. You can also take your own blood pressure at home with a blood pressure monitor. You may need to take your own blood pressure to: Confirm a diagnosis of high blood pressure (hypertension). Monitor your blood pressure over time. Make sure your blood pressure medicine is working. Supplies needed: Blood pressure monitor. A chair to sit in. This should be a chair where you can sit upright with your back supported. Do not sit on a soft couch or an armchair. Table or desk. Small notebook and pencil or pen. How to prepare To get the most accurate reading, avoid the following for 30 minutes before you check your blood pressure: Drinking caffeine. Drinking alcohol. Eating. Smoking. Exercising. Five minutes before you check your blood pressure: Use the bathroom and urinate so that you have an empty bladder. Sit quietly in a chair. Do not talk. How to take your blood pressure To check your blood pressure, follow the instructions in the manual that came with your blood pressure monitor. If you have a digital blood pressure monitor, the instructions may be as follows: Sit up straight in a chair. Place your feet on  the floor. Do not cross your ankles or legs. Rest your left arm at the level of your heart on a table or desk or on the arm of a chair. Pull up your shirt sleeve. Wrap the blood pressure cuff around the upper part of your left arm, 1 inch (2.5 cm) above your elbow. It is best to wrap the cuff around bare skin. Fit the cuff snugly, but not too tightly, around your arm. You should be able to place only one finger between the cuff and your arm. Position the cord so that it rests in the bend of your elbow. Press the power button. Sit quietly while the  cuff inflates and deflates. Read the digital reading on the monitor screen and write the numbers down (record them) in a notebook. Wait 2-3 minutes, then repeat the steps, starting at step 1. What does my blood pressure reading mean? A blood pressure reading consists of a higher number over a lower number. Ideally, your blood pressure should be below 120/80. The first ("top") number is called the systolic pressure. It is a measure of the pressure in your arteries as your heart beats. The second ("bottom") number is called the diastolic pressure. It is a measure of the pressure in your arteries as the heart relaxes. Blood pressure is classified into four stages. The following are the stages for adults who do not have a short-term serious illness or a chronic condition. Systolic pressure and diastolic pressure are measured in a unit called mm Hg (millimeters of mercury).  Normal Systolic pressure: below 562. Diastolic pressure: below 80. Elevated Systolic pressure: 563-893. Diastolic pressure: below 80. Hypertension stage 1 Systolic pressure: 734-287. Diastolic pressure: 68-11. Hypertension stage 2 Systolic pressure: 572 or above. Diastolic pressure: 90 or above. You can have elevated blood pressure or hypertension even if only the systolic or only the diastolic number in your reading is higher than normal. Follow these instructions at home: Medicines Take over-the-counter and prescription medicines only as told by your health care provider. Tell your health care provider if you are having any side effects from blood pressure medicine. General instructions Check your blood pressure as often as recommended by your health care provider. Check your blood pressure at the same time every day. Take your monitor to the next appointment with your health care provider to make sure that: You are using it correctly. It provides accurate readings. Understand what your goal blood pressure numbers  are. Keep all follow-up visits. This is important. General tips Your health care provider can suggest a reliable monitor that will meet your needs. There are several types of home blood pressure monitors. Choose a monitor that has an arm cuff. Do not choose a monitor that measures your blood pressure from your wrist or finger. Choose a cuff that wraps snugly, not too tight or too loose, around your upper arm. You should be able to fit only one finger between your arm and the cuff. You can buy a blood pressure monitor at most drugstores or online. Where to find more information American Heart Association: www.heart.org Contact a health care provider if: Your blood pressure is consistently high. Your blood pressure is suddenly low. Get help right away if: Your systolic blood pressure is higher than 180. Your diastolic blood pressure is higher than 120. These symptoms may be an emergency. Get help right away. Call 911. Do not wait to see if the symptoms will go away. Do not drive yourself to the hospital. Summary Blood  pressure is a measurement of how strongly your blood is pressing against the walls of your arteries. A blood pressure reading consists of a higher number over a lower number. Ideally, your blood pressure should be below 120/80. Check your blood pressure at the same time every day. Avoid caffeine, alcohol, smoking, and exercise for 30 minutes prior to checking your blood pressure. These agents can affect the accuracy of the blood pressure reading. This information is not intended to replace advice given to you by your health care provider. Make sure you discuss any questions you have with your health care provider. Document Revised: 07/20/2021 Document Reviewed: 07/20/2021 Elsevier Patient Education  [place].

## 2022-04-19 NOTE — Telephone Encounter (Signed)
Given to Dr. Carlota Raspberry for review. Once reviewed the immunization/vaccine orders will be scanned into the chart.

## 2022-04-26 ENCOUNTER — Encounter: Payer: Self-pay | Admitting: Family Medicine

## 2022-05-10 ENCOUNTER — Other Ambulatory Visit: Payer: Self-pay

## 2022-05-11 ENCOUNTER — Other Ambulatory Visit (HOSPITAL_COMMUNITY): Payer: Self-pay

## 2022-05-11 ENCOUNTER — Ambulatory Visit: Payer: 59 | Admitting: Family Medicine

## 2022-05-11 VITALS — BP 166/88 | HR 80 | Temp 98.1°F | Resp 16 | Ht 62.0 in | Wt 284.8 lb

## 2022-05-11 DIAGNOSIS — I10 Essential (primary) hypertension: Secondary | ICD-10-CM

## 2022-05-11 MED ORDER — AMLODIPINE BESYLATE 5 MG PO TABS
5.0000 mg | ORAL_TABLET | Freq: Every day | ORAL | 1 refills | Status: DC
  Filled 2022-05-11: qty 90, 90d supply, fill #0
  Filled 2022-07-20 – 2022-07-24 (×2): qty 90, 90d supply, fill #1

## 2022-05-11 NOTE — Progress Notes (Signed)
Subjective:  Patient ID: [name], female    DOB: 1971-02-16  Age: 51 y.o. MRN: [id number]  CC:  Chief Complaint  Patient presents with   Hypertension    Pt went after last visit got monitor from the pharmacy, notes has had some slight headache, pt reports home BP of 163/101, 162/82, 155/84, 151/79, 160/100, 157/94, 170/93    HPI [name] presents for   Hypertension: Annual exam June 1, some elevated blood pressures outside of office including a gynecology.  No meds for hypertension, borderline readings last visit.  Decrease sodium recommended, home monitoring discussed.  Blood pressures have remained elevated at home as above anywhere from 151 to 170 systolic over 79-101 diastolic. No CP, dyspnea, weakness. Slight HA.  Home readings as above.  Has cut back on salt, some weight loss.  Plans to take otc vit D. No prior low.  Wt Readings from Last 3 Encounters:  05/11/22 284 lb 12.8 oz (129.2 kg)  04/19/22 286 lb 9.6 oz (130 kg)  02/13/22 287 lb 4 oz (130.3 kg)    BP Readings from Last 3 Encounters:  05/11/22 (!) 166/88  04/19/22 138/88  02/13/22 (!) 162/90   Lab Results  Component Value Date   CREATININE 0.68 04/19/2022     History There are no problems to display for this patient.  Past Medical History:  Diagnosis Date   Anemia    Headache(784.0)    PONV (postoperative nausea and vomiting)    Past Surgical History:  Procedure Laterality Date   DILATION AND CURETTAGE OF UTERUS  2000   DILITATION & CURRETTAGE/HYSTROSCOPY WITH NOVASURE ABLATION N/A 02/05/2013   Procedure: DILATATION & CURETTAGE/HYSTEROSCOPY WITH NOVASURE ABLATION;  Surgeon: Meriel Pica, MD;  Location: [facility];  Service: Gynecology;  Laterality: N/A;   LAPAROSCOPIC TUBAL LIGATION Bilateral 02/05/2013   Procedure: LAPAROSCOPIC TUBAL LIGATION;  Surgeon: Meriel Pica, MD;  Location: [facility];  Service: Gynecology;  Laterality: Bilateral;  attempted tubal ligation   No Known  Allergies Prior to Admission medications   Medication Sig Start Date End Date Taking? Authorizing Provider  Ascorbic Acid (VITAMIN C PO) Take 1 tablet by mouth daily.   Yes [provider]  Blood Pressure Monitoring (BLOOD PRESSURE MONITOR 3) DEVI Use as directed. 04/09/22  Yes   fluticasone (FLONASE) 50 MCG/ACT nasal spray Place 1-2 sprays into both nostrils daily. 04/13/21  Yes Shade Flood, MD  ibuprofen (ADVIL) 800 MG tablet ibuprofen 800 mg tablet   Yes [provider]  Multiple Vitamins-Minerals (MULTIVITAMIN ADULT EXTRA C PO) multivitamin   Yes [provider]   Social History   Socioeconomic History   Marital status: Married    Spouse name: Not on file   Number of children: Not on file   Years of education: Not on file   Highest education level: Not on file  Occupational History   Not on file  Tobacco Use   Smoking status: Never   Smokeless tobacco: Never  Vaping Use   Vaping Use: Never used  Substance and Sexual Activity   Alcohol use: Yes    Comment: occas   Drug use: No   Sexual activity: Yes  Other Topics Concern   Not on file  Social History Narrative   Not on file   Social Determinants of Health   Financial Resource Strain: Not on file  Food Insecurity: Not on file  Transportation Needs: Not on file  Physical Activity: Not on file  Stress:  Not on file  Social Connections: Not on file  Intimate Partner Violence: Not on file    Review of Systems  Constitutional:  Negative for fatigue and unexpected weight change.  Respiratory:  Negative for chest tightness and shortness of breath.   Cardiovascular:  Negative for chest pain, palpitations and leg swelling.  Gastrointestinal:  Negative for abdominal pain and blood in stool.  Neurological:  Positive for headaches (mild.). Negative for dizziness, syncope and light-headedness.     Objective:   Vitals:   05/11/22 0800 05/11/22 0803  BP: (!) 166/88 (!) 166/88  Pulse: 80    Resp: 16   Temp: 98.1 F (36.7 C)   TempSrc: Temporal   SpO2: 99%   Weight: 284 lb 12.8 oz (129.2 kg)   Height: 5\' 2"  (1.575 m)      Physical Exam Vitals reviewed.  Constitutional:      Appearance: Normal appearance. She is well-developed.  HENT:     Head: Normocephalic and atraumatic.  Eyes:     Conjunctiva/sclera: Conjunctivae normal.     Pupils: Pupils are equal, round, and reactive to light.  Neck:     Vascular: No carotid bruit.  Cardiovascular:     Rate and Rhythm: Normal rate and regular rhythm.     Heart sounds: Normal heart sounds.  Pulmonary:     Effort: Pulmonary effort is normal.     Breath sounds: Normal breath sounds.  Abdominal:     Palpations: Abdomen is soft. There is no pulsatile mass.     Tenderness: There is no abdominal tenderness.  Musculoskeletal:     Right lower leg: No edema.     Left lower leg: No edema.  Skin:    General: Skin is warm and dry.  Neurological:     Mental Status: She is alert and oriented to person, place, and time.  Psychiatric:        Mood and Affect: Mood normal.        Behavior: Behavior normal.        Assessment & Plan:  [name] is a 51 y.o. female . Essential hypertension - Plan: amLODipine (NORVASC) 5 MG tablet New diagnosis, persistent elevated readings.  Labs reassuring from last visit.  Option of HCTZ versus amlodipine.  We will start with amlodipine, potential side effects including pedal edema discussed with RTC precautions.  Continue home monitoring, handout on management of hypertension, follow-up in approximately 4 to 5 weeks, has appointment scheduled.  RTC precautions.  Meds ordered this encounter  Medications   amLODipine (NORVASC) 5 MG tablet    Sig: Take 1 tablet (5 mg total) by mouth daily.    Dispense:  90 tablet    Refill:  1   Patient Instructions  Start amlodipine once per day.  Recheck in 1 month at scheduled follow up Let me know if any new side effects.  Vitamin D  over-the-counter is okay to take if you would like.  We can check that level with next blood work if needed.  Return to the clinic or go to the nearest emergency room if any of your symptoms worsen or new symptoms occur.  Managing Your Hypertension Hypertension, also called high blood pressure, is when the force of the blood pressing against the walls of the arteries is too strong. Arteries are blood vessels that carry blood from your heart throughout your body. Hypertension forces the heart to work harder to pump blood and may cause the arteries to become narrow or  stiff. Understanding blood pressure readings A blood pressure reading includes a higher number over a lower number: The first, or top, number is called the systolic pressure. It is a measure of the pressure in your arteries as your heart beats. The second, or bottom number, is called the diastolic pressure. It is a measure of the pressure in your arteries as the heart relaxes. For most people, a normal blood pressure is below 120/80. Your personal target blood pressure may vary depending on your medical conditions, your age, and other factors. Blood pressure is classified into four stages. Based on your blood pressure reading, your health care provider may use the following stages to determine what type of treatment you need, if any. Systolic pressure and diastolic pressure are measured in a unit called millimeters of mercury (mmHg). Normal Systolic pressure: below 120. Diastolic pressure: below 80. Elevated Systolic pressure: 120-129. Diastolic pressure: below 80. Hypertension stage 1 Systolic pressure: 130-139. Diastolic pressure: 80-89. Hypertension stage 2 Systolic pressure: 140 or above. Diastolic pressure: 90 or above. How can this condition affect me? Managing your hypertension is very important. Over time, hypertension can damage the arteries and decrease blood flow to parts of the body, including the brain, heart, and  kidneys. Having untreated or uncontrolled hypertension can lead to: A heart attack. A stroke. A weakened blood vessel (aneurysm). Heart failure. Kidney damage. Eye damage. Memory and concentration problems. Vascular dementia. What actions can I take to manage this condition? Hypertension can be managed by making lifestyle changes and possibly by taking medicines. Your health care provider will help you make a plan to bring your blood pressure within a normal range. You may be referred for counseling on a healthy diet and physical activity. Nutrition  Eat a diet that is high in fiber and potassium, and low in salt (sodium), added sugar, and fat. An example eating plan is called the DASH diet. DASH stands for Dietary Approaches to Stop Hypertension. To eat this way: Eat plenty of fresh fruits and vegetables. Try to fill one-half of your plate at each meal with fruits and vegetables. Eat whole grains, such as whole-wheat pasta, brown rice, or whole-grain bread. Fill about one-fourth of your plate with whole grains. Eat low-fat dairy products. Avoid fatty cuts of meat, processed or cured meats, and poultry with skin. Fill about one-fourth of your plate with lean proteins such as fish, chicken without skin, beans, eggs, and tofu. Avoid pre-made and processed foods. These tend to be higher in sodium, added sugar, and fat. Reduce your daily sodium intake. Many people with hypertension should eat less than 1,500 mg of sodium a day. Lifestyle  Work with your health care provider to maintain a healthy body weight or to lose weight. Ask what an ideal weight is for you. Get at least 30 minutes of exercise that causes your heart to beat faster (aerobic exercise) most days of the week. Activities may include walking, swimming, or biking. Include exercise to strengthen your muscles (resistance exercise), such as weight lifting, as part of your weekly exercise routine. Try to do these types of exercises for  30 minutes at least 3 days a week. Do not use any products that contain nicotine or tobacco. These products include cigarettes, chewing tobacco, and vaping devices, such as e-cigarettes. If you need help quitting, ask your health care provider. Control any long-term (chronic) conditions you have, such as high cholesterol or diabetes. Identify your sources of stress and find ways to manage stress. This may  include meditation, deep breathing, or making time for fun activities. Alcohol use Do not drink alcohol if: Your health care provider tells you not to drink. You are pregnant, may be pregnant, or are planning to become pregnant. If you drink alcohol: Limit how much you have to: 0-1 drink a day for women. 0-2 drinks a day for men. Know how much alcohol is in your drink. In the [place] one drink equals one 12 oz bottle of beer (355 mL), one 5 oz glass of wine (148 mL), or one 1 oz glass of hard liquor (44 mL). Medicines Your health care provider may prescribe medicine if lifestyle changes are not enough to get your blood pressure under control and if: Your systolic blood pressure is 130 or higher. Your diastolic blood pressure is 80 or higher. Take medicines only as told by your health care provider. Follow the directions carefully. Blood pressure medicines must be taken as told by your health care provider. The medicine does not work as well when you skip doses. Skipping doses also puts you at risk for problems. Monitoring Before you monitor your blood pressure: Do not smoke, drink caffeinated beverages, or exercise within 30 minutes before taking a measurement. Use the bathroom and empty your bladder (urinate). Sit quietly for at least 5 minutes before taking measurements. Monitor your blood pressure at home as told by your health care provider. To do this: Sit with your back straight and supported. Place your feet flat on the floor. Do not cross your legs. Support your arm on a flat  surface, such as a table. Make sure your upper arm is at heart level. Each time you measure, take two or three readings one minute apart and record the results. You may also need to have your blood pressure checked regularly by your health care provider. General information Talk with your health care provider about your diet, exercise habits, and other lifestyle factors that may be contributing to hypertension. Review all the medicines you take with your health care provider because there may be side effects or interactions. Keep all follow-up visits. Your health care provider can help you create and adjust your plan for managing your high blood pressure. Where to find more information National Heart, Lung, and Blood Institute: PopSte[employer].is American Heart Association: www.heart.org Contact a health care provider if: You think you are having a reaction to medicines you have taken. You have repeated (recurrent) headaches. You feel dizzy. You have swelling in your ankles. You have trouble with your vision. Get help right away if: You develop a severe headache or confusion. You have unusual weakness or numbness, or you feel faint. You have severe pain in your chest or abdomen. You vomit repeatedly. You have trouble breathing. These symptoms may be an emergency. Get help right away. Call 911. Do not wait to see if the symptoms will go away. Do not drive yourself to the hospital. Summary Hypertension is when the force of blood pumping through your arteries is too strong. If this condition is not controlled, it may put you at risk for serious complications. Your personal target blood pressure may vary depending on your medical conditions, your age, and other factors. For most people, a normal blood pressure is less than 120/80. Hypertension is managed by lifestyle changes, medicines, or both. Lifestyle changes to help manage hypertension include losing weight, eating a healthy, low-sodium  diet, exercising more, stopping smoking, and limiting alcohol. This information is not intended to replace advice given to you  by your health care provider. Make sure you discuss any questions you have with your health care provider. Document Revised: 07/20/2021 Document Reviewed: 07/20/2021 Elsevier Patient Education  2023 [employer].     Signed,   Meredith Staggers, MD [facility], [facility] 05/11/22 8:22 AM

## 2022-06-01 ENCOUNTER — Other Ambulatory Visit: Payer: Self-pay

## 2022-06-01 ENCOUNTER — Other Ambulatory Visit (HOSPITAL_COMMUNITY): Payer: Self-pay

## 2022-06-01 ENCOUNTER — Telehealth: Payer: Self-pay

## 2022-06-01 DIAGNOSIS — Z1211 Encounter for screening for malignant neoplasm of colon: Secondary | ICD-10-CM

## 2022-06-01 MED ORDER — NA SULFATE-K SULFATE-MG SULF 17.5-3.13-1.6 GM/177ML PO SOLN
1.0000 | Freq: Once | ORAL | 0 refills | Status: AC
  Filled 2022-06-01 – 2022-06-13 (×2): qty 354, 1d supply, fill #0

## 2022-06-01 NOTE — Telephone Encounter (Signed)
Called and scheduled patient for Colonoscopy at [facility] on 06/19/22 @ 11am.

## 2022-06-06 ENCOUNTER — Telehealth: Payer: Self-pay

## 2022-06-06 NOTE — Telephone Encounter (Signed)
Called patient and let her know she has been scheduled for a previsit 06/12/22 @ 4pm. Patient stated understanding and had no questions at the end of call.

## 2022-06-08 DIAGNOSIS — T884XXA Failed or difficult intubation, initial encounter: Secondary | ICD-10-CM | POA: Insufficient documentation

## 2022-06-12 ENCOUNTER — Ambulatory Visit (AMBULATORY_SURGERY_CENTER): Payer: Self-pay | Admitting: *Deleted

## 2022-06-12 ENCOUNTER — Encounter (HOSPITAL_COMMUNITY): Payer: Self-pay | Admitting: Gastroenterology

## 2022-06-12 ENCOUNTER — Other Ambulatory Visit (HOSPITAL_COMMUNITY): Payer: Self-pay

## 2022-06-12 VITALS — Ht 62.0 in | Wt 288.2 lb

## 2022-06-12 DIAGNOSIS — Z1211 Encounter for screening for malignant neoplasm of colon: Secondary | ICD-10-CM

## 2022-06-12 MED ORDER — NA SULFATE-K SULFATE-MG SULF 17.5-3.13-1.6 GM/177ML PO SOLN
1.0000 | Freq: Once | ORAL | 0 refills | Status: AC
  Filled 2022-06-12: qty 354, 1d supply, fill #0

## 2022-06-12 NOTE — Progress Notes (Signed)
No egg or soy allergy known to patient  No issues known to pt with past sedation with any surgeries or procedures Patient denies ever being told they had issues or difficulty with intubation  No FH of Malignant Hyperthermia Pt is not on diet pills Pt is not on home 02  Pt is not on blood thinners  Pt denies issues with constipation  No A fib or A flutter Have any cardiac testing pending--NO Pt instructed to use Singlecare.com or GoodRx for a price reduction on prep   

## 2022-06-13 ENCOUNTER — Other Ambulatory Visit (HOSPITAL_COMMUNITY): Payer: Self-pay

## 2022-06-15 ENCOUNTER — Encounter: Payer: Self-pay | Admitting: Gastroenterology

## 2022-06-18 ENCOUNTER — Ambulatory Visit: Payer: 59 | Admitting: Family Medicine

## 2022-06-18 VITALS — BP 132/70 | HR 90 | Temp 98.2°F | Resp 17 | Ht 62.0 in | Wt 286.4 lb

## 2022-06-18 DIAGNOSIS — Z87898 Personal history of other specified conditions: Secondary | ICD-10-CM

## 2022-06-18 DIAGNOSIS — I10 Essential (primary) hypertension: Secondary | ICD-10-CM

## 2022-06-18 DIAGNOSIS — R7303 Prediabetes: Secondary | ICD-10-CM

## 2022-06-18 DIAGNOSIS — R079 Chest pain, unspecified: Secondary | ICD-10-CM

## 2022-06-18 LAB — POCT GLYCOSYLATED HEMOGLOBIN (HGB A1C): Hemoglobin A1C: 6.8 % — AB (ref 4.0–5.6)

## 2022-06-18 NOTE — Progress Notes (Signed)
Subjective:  Patient ID: [name], female    DOB: 15-Dec-1970  Age: 51 y.o. MRN: [id number]  CC:  Chief Complaint  Patient presents with   Prediabetes   Hypertension    HPI [name] presents for   Prediabetes: Stable A1c in February.   Exercise: walking.  Sugar-containing beverages: none.  Fast food/takeout, minimal - cooks for self. Does eat rice.  Normal lipid panel June 1. Lab Results  Component Value Date   HGBA1C 6.8 (A) 06/18/2022   Wt Readings from Last 3 Encounters:  06/18/22 286 lb 6.4 oz (129.9 kg)  06/12/22 288 lb 3.2 oz (130.7 kg)  05/11/22 284 lb 12.8 oz (129.2 kg)   Hypertension: Elevated readings earlier this year including at gynecology.  Last visit June 23.  Initially tried to decrease sodium, home monitoring.  Persistent elevated readings off meds.  Started amlodipine 5 mg on 05/11/2022. Noticed palpitations initially starting amlodipine, initial 2 days.  Once in awhile pain in front of chest - sharp pain, when active - cooking, active and at rest. Chest pain about once per week.  Lasts few seconds, then resolves. No associated n/v/diaphoresis, radiation of pain or dyspnea. No current chest pain.  No heartburn, chest wall not sore.  Otherwise feels well, prior HA resolved on BP med.  No FH of early CAD.  Home readings: 121-166/76-101 BP Readings from Last 3 Encounters:  06/18/22 132/70  05/11/22 (!) 166/88  04/19/22 138/88   Lab Results  Component Value Date   CREATININE 0.68 04/19/2022   Lab Results  Component Value Date   TSH 3.44 04/19/2022   Lab Results  Component Value Date   CHOL 156 04/19/2022   HDL 46.70 04/19/2022   LDLCALC 91 04/19/2022   TRIG 92.0 04/19/2022   CHOLHDL 3 04/19/2022    Health maintenance: Colonoscopy planned tomorrow.   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   Past Medical History:  Diagnosis Date   Anemia    Difficult airway for  intubation    Pt.denies updated 06/12/22   Headache(784.0)    Hypertension    PONV (postoperative nausea and vomiting)    Past Surgical History:  Procedure Laterality Date   DILATION AND CURETTAGE OF UTERUS  2000   DILITATION & CURRETTAGE/HYSTROSCOPY WITH NOVASURE ABLATION N/A 02/05/2013   Procedure: DILATATION & CURETTAGE/HYSTEROSCOPY WITH NOVASURE ABLATION;  Surgeon: Margarette Asal, MD;  Location: [facility];  Service: Gynecology;  Laterality: N/A;   LAPAROSCOPIC TUBAL LIGATION Bilateral 02/05/2013   Procedure: LAPAROSCOPIC TUBAL LIGATION;  Surgeon: Margarette Asal, MD;  Location: [facility];  Service: Gynecology;  Laterality: Bilateral;  attempted tubal ligation   Allergies  Allergen Reactions   Lactose Intolerance (Gi) Other (See Comments)    Cheese/milk products - GI upset   Prior to Admission medications   Medication Sig Start Date End Date Taking? Authorizing Provider  amLODipine (NORVASC) 5 MG tablet Take 1 tablet (5 mg total) by mouth daily. 05/11/22  Yes [name], MD  Ascorbic Acid (VITAMIN C PO) Take 1 tablet by mouth in the morning.   Yes [provider]  Biotin 10000 MCG TABS Take by mouth daily.   Yes [provider]  Blood Pressure Monitoring (BLOOD PRESSURE MONITOR 3) DEVI Use as directed. 04/09/22  Yes   Cholecalciferol (VITAMIN D3 PO) Take by mouth daily. 2000 iu   Yes [provider]  Multiple Vitamin (MULTIVITAMIN WITH MINERALS)  TABS tablet Take 1 tablet by mouth in the morning. Centrum for Women 50+   Yes [provider]  fluticasone (FLONASE) 50 MCG/ACT nasal spray Place 1-2 sprays into both nostrils daily. Patient not taking: Reported on 06/12/2022 04/13/21   [name], MD  ibuprofen (ADVIL) 200 MG tablet Take 200 mg by mouth every 8 (eight) hours as needed (pain.). Patient not taking: Reported on 06/12/2022    [provider]   Social History   Socioeconomic History   Marital status: Married    Spouse name:  Not on file   Number of children: Not on file   Years of education: Not on file   Highest education level: Not on file  Occupational History   Not on file  Tobacco Use   Smoking status: Never    Passive exposure: Never   Smokeless tobacco: Never  Vaping Use   Vaping Use: Never used  Substance and Sexual Activity   Alcohol use: Yes    Comment: occas   Drug use: No   Sexual activity: Yes  Other Topics Concern   Not on file  Social History Narrative   Not on file   Social Determinants of Health   Financial Resource Strain: Not on file  Food Insecurity: Not on file  Transportation Needs: Not on file  Physical Activity: Not on file  Stress: Not on file  Social Connections: Not on file  Intimate Partner Violence: Not on file    Review of Systems  Constitutional:  Negative for fatigue and unexpected weight change.  Respiratory:  Negative for chest tightness and shortness of breath.   Cardiovascular:  Positive for chest pain. Negative for palpitations (initially with starting amlodipine, resolved.) and leg swelling.  Gastrointestinal:  Negative for abdominal pain and blood in stool.  Neurological:  Negative for dizziness, syncope, light-headedness and headaches.     Objective:   Vitals:   06/18/22 0814  BP: 132/70  Pulse: 90  Resp: 17  Temp: 98.2 F (36.8 C)  TempSrc: Oral  SpO2: 100%  Weight: 286 lb 6.4 oz (129.9 kg)  Height: '5\' 2"'$  (1.575 m)   Physical Exam Vitals reviewed.  Constitutional:      General: She is not in acute distress.    Appearance: Normal appearance. She is well-developed. She is obese. She is not ill-appearing, toxic-appearing or diaphoretic.  HENT:     Head: Normocephalic and atraumatic.  Eyes:     Conjunctiva/sclera: Conjunctivae normal.     Pupils: Pupils are equal, round, and reactive to light.  Neck:     Vascular: No carotid bruit.  Cardiovascular:     Rate and Rhythm: Normal rate and regular rhythm.     Heart sounds: Normal heart  sounds.  Pulmonary:     Effort: Pulmonary effort is normal.     Breath sounds: Normal breath sounds.  Abdominal:     Palpations: Abdomen is soft. There is no pulsatile mass.     Tenderness: There is no abdominal tenderness.  Musculoskeletal:     Right lower leg: No edema.     Left lower leg: No edema.  Skin:    General: Skin is warm and dry.  Neurological:     Mental Status: She is alert and oriented to person, place, and time.  Psychiatric:        Mood and Affect: Mood normal.        Behavior: Behavior normal.     EKG: Sinus rhythm, low voltage precordial leads.  Incomplete RBBB nonspecific T wave in lead III as well as antero-lateral leads.  No apparent acute findings.  No significant changes from comparison EKG 10/30/2016.  Results for orders placed or performed in visit on 06/18/22  POCT glycosylated hemoglobin (Hb A1C)  Result Value Ref Range   Hemoglobin A1C 6.8 (A) 4.0 - 5.6 %   HbA1c POC (<> result, manual entry)     HbA1c, POC (prediabetic range)     HbA1c, POC (controlled diabetic range)        Assessment & Plan:  [name] is a 51 y.o. female . Chest pain, unspecified type - Plan: Ambulatory referral to Cardiology, EKG 12-Lead  -Fleeting symptoms, atypical.  Cardiac risk factors of hypertension, prediabetes/diabetes based on current A1c.  Most recent lipids overall reassuring.  Blood pressure controlled in office.  No apparent acute changes on EKG.  Referred to cardiology with ER chest pain precautions given, handout given.  Prediabetes - Plan: POCT glycosylated hemoglobin (Hb A1C)  -A1c is increased, not diabetic level.  Initial plan to cut back on carbohydrates, including rice.  Low intensity activity once cleared by cardiology, recheck levels in 3 months.  Essential hypertension  -Controlled in office, continue amlodipine for now.  Elevated home readings, question accuracy of home monitoring.  RTC precautions for blood pressure check visit with me and home  monitor if persistent elevations.  History of palpitations  -Initial few days of amlodipine, now resolved.  RTC precautions if recurs.  No orders of the defined types were placed in this encounter.  Patient Instructions  Based on today's reading I do not think we need to increase blood pressure medication.  Continue to check blood pressures at home if there is persistently are elevated using the technique discussed below, follow-up with your blood pressure machine to make sure it is reading accurately.  If any return of heart palpitations, I would like to check some other blood work.  Let me know if that occurs.   Fleeting chest pain does not sound concerning at this time but I will refer you to cardiology to evaluate the symptoms further.  If any increasing or worsening chest pain, be seen right away through emergency room as we discussed.  Unfortunately blood sugar test has increased and now at the diabetes level.  Cut back on rice, carbohydrates, sugars, and low intensity exercise once cardiology clears you to do so. Follow-up in the next 3 months for blood pressure and blood sugar.  Return to the clinic or go to the nearest emergency room if any of your symptoms worsen or new symptoms occur.  How to Take Your Blood Pressure Blood pressure is a measurement of how strongly your blood is pressing against the walls of your arteries. Arteries are blood vessels that carry blood from your heart throughout your body. Your health care provider takes your blood pressure at each office visit. You can also take your own blood pressure at home with a blood pressure monitor. You may need to take your own blood pressure to: Confirm a diagnosis of high blood pressure (hypertension). Monitor your blood pressure over time. Make sure your blood pressure medicine is working. Supplies needed: Blood pressure monitor. A chair to sit in. This should be a chair where you can sit upright with your back  supported. Do not sit on a soft couch or an armchair. Table or desk. Small notebook and pencil or pen. How to prepare To get the most accurate reading, avoid the following  for 30 minutes before you check your blood pressure: Drinking caffeine. Drinking alcohol. Eating. Smoking. Exercising. Five minutes before you check your blood pressure: Use the bathroom and urinate so that you have an empty bladder. Sit quietly in a chair. Do not talk. How to take your blood pressure To check your blood pressure, follow the instructions in the manual that came with your blood pressure monitor. If you have a digital blood pressure monitor, the instructions may be as follows: Sit up straight in a chair. Place your feet on the floor. Do not cross your ankles or legs. Rest your left arm at the level of your heart on a table or desk or on the arm of a chair. Pull up your shirt sleeve. Wrap the blood pressure cuff around the upper part of your left arm, 1 inch (2.5 cm) above your elbow. It is best to wrap the cuff around bare skin. Fit the cuff snugly, but not too tightly, around your arm. You should be able to place only one finger between the cuff and your arm. Position the cord so that it rests in the bend of your elbow. Press the power button. Sit quietly while the cuff inflates and deflates. Read the digital reading on the monitor screen and write the numbers down (record them) in a notebook. Wait 2-3 minutes, then repeat the steps, starting at step 1. What does my blood pressure reading mean? A blood pressure reading consists of a higher number over a lower number. Ideally, your blood pressure should be below 120/80. The first ("top") number is called the systolic pressure. It is a measure of the pressure in your arteries as your heart beats. The second ("bottom") number is called the diastolic pressure. It is a measure of the pressure in your arteries as the heart relaxes. Blood pressure is classified  into four stages. The following are the stages for adults who do not have a short-term serious illness or a chronic condition. Systolic pressure and diastolic pressure are measured in a unit called mm Hg (millimeters of mercury).  Normal Systolic pressure: below 735. Diastolic pressure: below 80. Elevated Systolic pressure: 329-924. Diastolic pressure: below 80. Hypertension stage 1 Systolic pressure: 268-341. Diastolic pressure: 96-22. Hypertension stage 2 Systolic pressure: 297 or above. Diastolic pressure: 90 or above. You can have elevated blood pressure or hypertension even if only the systolic or only the diastolic number in your reading is higher than normal. Follow these instructions at home: Medicines Take over-the-counter and prescription medicines only as told by your health care provider. Tell your health care provider if you are having any side effects from blood pressure medicine. General instructions Check your blood pressure as often as recommended by your health care provider. Check your blood pressure at the same time every day. Take your monitor to the next appointment with your health care provider to make sure that: You are using it correctly. It provides accurate readings. Understand what your goal blood pressure numbers are. Keep all follow-up visits. This is important. General tips Your health care provider can suggest a reliable monitor that will meet your needs. There are several types of home blood pressure monitors. Choose a monitor that has an arm cuff. Do not choose a monitor that measures your blood pressure from your wrist or finger. Choose a cuff that wraps snugly, not too tight or too loose, around your upper arm. You should be able to fit only one finger between your arm and the cuff. You  can buy a blood pressure monitor at most drugstores or online. Where to find more information American Heart Association: www.heart.org Contact a health care  provider if: Your blood pressure is consistently high. Your blood pressure is suddenly low. Get help right away if: Your systolic blood pressure is higher than 180. Your diastolic blood pressure is higher than 120. These symptoms may be an emergency. Get help right away. Call 911. Do not wait to see if the symptoms will go away. Do not drive yourself to the hospital. Summary Blood pressure is a measurement of how strongly your blood is pressing against the walls of your arteries. A blood pressure reading consists of a higher number over a lower number. Ideally, your blood pressure should be below 120/80. Check your blood pressure at the same time every day. Avoid caffeine, alcohol, smoking, and exercise for 30 minutes prior to checking your blood pressure. These agents can affect the accuracy of the blood pressure reading. This information is not intended to replace advice given to you by your health care provider. Make sure you discuss any questions you have with your health care provider. Document Revised: 07/20/2021 Document Reviewed: 07/20/2021 Elsevier Patient Education  [place].   Nonspecific Chest Pain, Adult Chest pain is an uncomfortable, tight, or painful feeling in the chest. The pain can feel like a crushing, aching, or squeezing pressure. A person can feel a burning or tingling sensation. Chest pain can also be felt in your back, neck, jaw, shoulder, or arm. This pain can be worse when you move, sneeze, or take a deep breath. Chest pain can be caused by a condition that is life-threatening. This must be treated right away. It can also be caused by something that is not life-threatening. If you have chest pain, it can be hard to know the difference, so it is important to get help right away to make sure that you do not have a serious condition. Some life-threatening causes of chest pain include: Heart attack. A tear in the body's main blood vessel (aortic  dissection). Inflammation around your heart (pericarditis). A problem in the lungs, such as a blood clot (pulmonary embolism) or a collapsed lung (pneumothorax). Some non life-threatening causes of chest pain include: Heartburn. Anxiety or stress. Damage to the bones, muscles, and cartilage that make up your chest wall. Pneumonia or bronchitis. Shingles infection (varicella-zoster virus). Your chest pain may come and go. It may also be constant. Your health care provider will do tests and other studies to find the cause of your pain. Treatment will depend on the cause of your chest pain. Follow these instructions at home: Medicines Take over-the-counter and prescription medicines only as told by your health care provider. If you were prescribed an antibiotic medicine, take it as told by your health care provider. Do not stop taking the antibiotic even if you start to feel better. Activity Avoid any activities that cause chest pain. Do not lift anything that is heavier than 10 lb (4.5 kg), or the limit that you are told, until your health care provider says that it is safe. Rest as directed by your health care provider. Return to your normal activities only as told by your health care provider. Ask your health care provider what activities are safe for you. Lifestyle     Do not use any products that contain nicotine or tobacco, such as cigarettes, e-cigarettes, and chewing tobacco. If you need help quitting, ask your health care provider. Do not drink  alcohol. Make healthy lifestyle changes as recommended. These may include: Getting regular exercise. Ask your health care provider to suggest some exercises that are safe for you. Eating a heart-healthy diet. This includes plenty of fresh fruits and vegetables, whole grains, low-fat (lean) protein, and low-fat dairy products. A dietitian can help you find healthy eating options. Maintaining a healthy weight. Managing any other health  conditions you may have, such as high blood pressure (hypertension) or diabetes. Reducing stress, such as with yoga or relaxation techniques. General instructions Pay attention to any changes in your symptoms. It is up to you to get the results of any tests that were done. Ask your health care provider, or the department that is doing the tests, when your results will be ready. Keep all follow-up visits as told by your health care provider. This is important. You may be asked to go for further testing if your chest pain does not go away. Contact a health care provider if: Your chest pain does not go away. You feel depressed. You have a fever. You notice changes in your symptoms or develop new symptoms. Get help right away if: Your chest pain gets worse. You have a cough that gets worse, or you cough up blood. You have severe pain in your abdomen. You faint. You have sudden, unexplained chest discomfort. You have sudden, unexplained discomfort in your arms, back, neck, or jaw. You have shortness of breath at any time. You suddenly start to sweat, or your skin gets clammy. You feel nausea or you vomit. You suddenly feel lightheaded or dizzy. You have severe weakness, or unexplained weakness or fatigue. Your heart begins to beat quickly, or it feels like it is skipping beats. These symptoms may represent a serious problem that is an emergency. Do not wait to see if the symptoms will go away. Get medical help right away. Call your local emergency services (911 in the [place] Do not drive yourself to the hospital. Summary Chest pain can be caused by a condition that is serious and requires urgent treatment. It may also be caused by something that is not life-threatening. Your health care provider may do lab tests and other studies to find the cause of your pain. Follow your health care provider's instructions on taking medicines, making lifestyle changes, and getting emergency treatment if  symptoms become worse. Keep all follow-up visits as told by your health care provider. This includes visits for any further testing if your chest pain does not go away. This information is not intended to replace advice given to you by your health care provider. Make sure you discuss any questions you have with your health care provider. Document Revised: 01/19/2021 Document Reviewed: 01/19/2021 Elsevier Patient Education  [place],   [name], MD [place] 06/18/22 9:42 AM

## 2022-06-18 NOTE — Anesthesia Preprocedure Evaluation (Addendum)
Anesthesia Evaluation  Patient identified by MRN, date of birth, ID band Patient awake    Reviewed: Allergy & Precautions, NPO status , Patient's Chart, lab work & pertinent test results  History of Anesthesia Complications (+) PONV and history of anesthetic complications  Airway Mallampati: III  TM Distance: >3 FB Neck ROM: Full    Dental no notable dental hx. (+) Dental Advisory Given, Poor Dentition   Pulmonary neg pulmonary ROS,    Pulmonary exam normal breath sounds clear to auscultation       Cardiovascular hypertension, Normal cardiovascular exam Rhythm:Regular Rate:Normal     Neuro/Psych  Headaches,    GI/Hepatic negative GI ROS, Neg liver ROS,   Endo/Other  Morbid obesity (BMI 52.4)  Renal/GU Lab Results      Component                Value               Date                      CREATININE               0.68                04/19/2022                K                        4.1                 04/19/2022                  Musculoskeletal negative musculoskeletal ROS (+)   Abdominal (+) + obese,   Peds  Hematology Lab Results      Component                Value               Date                          HGB                      12.9                01/21/2019                HCT                      38.8                01/21/2019             PLT                      272                 01/21/2019              Anesthesia Other Findings   Reproductive/Obstetrics                            Anesthesia Physical Anesthesia Plan  ASA: 3  Anesthesia Plan: MAC   Post-op Pain Management:    Induction:   PONV Risk Score and Plan: Treatment may vary due to age or medical condition  Airway Management Planned: Nasal Cannula and Natural Airway  Additional Equipment: None  Intra-op Plan:   Post-operative Plan:   Informed Consent: I have reviewed the patients History and Physical,  chart, labs and discussed the procedure including the risks, benefits and alternatives for the proposed anesthesia with the patient or authorized representative who has indicated his/her understanding and acceptance.     Dental advisory given  Plan Discussed with:   Anesthesia Plan Comments: (Colorectal screening for colonoscopy)      Anesthesia Quick Evaluation

## 2022-06-18 NOTE — Patient Instructions (Addendum)
Based on today's reading I do not think we need to increase blood pressure medication.  Continue to check blood pressures at home if there is persistently are elevated using the technique discussed below, follow-up with your blood pressure machine to make sure it is reading accurately.  If any return of heart palpitations, I would like to check some other blood work.  Let me know if that occurs.   Fleeting chest pain does not sound concerning at this time but I will refer you to cardiology to evaluate the symptoms further.  If any increasing or worsening chest pain, be seen right away through emergency room as we discussed.  Unfortunately blood sugar test has increased and now at the diabetes level.  Cut back on rice, carbohydrates, sugars, and low intensity exercise once cardiology clears you to do so. Follow-up in the next 3 months for blood pressure and blood sugar.  Return to the clinic or go to the nearest emergency room if any of your symptoms worsen or new symptoms occur.  How to Take Your Blood Pressure Blood pressure is a measurement of how strongly your blood is pressing against the walls of your arteries. Arteries are blood vessels that carry blood from your heart throughout your body. Your health care provider takes your blood pressure at each office visit. You can also take your own blood pressure at home with a blood pressure monitor. You may need to take your own blood pressure to: Confirm a diagnosis of high blood pressure (hypertension). Monitor your blood pressure over time. Make sure your blood pressure medicine is working. Supplies needed: Blood pressure monitor. A chair to sit in. This should be a chair where you can sit upright with your back supported. Do not sit on a soft couch or an armchair. Table or desk. Small notebook and pencil or pen. How to prepare To get the most accurate reading, avoid the following for 30 minutes before you check your blood pressure: Drinking  caffeine. Drinking alcohol. Eating. Smoking. Exercising. Five minutes before you check your blood pressure: Use the bathroom and urinate so that you have an empty bladder. Sit quietly in a chair. Do not talk. How to take your blood pressure To check your blood pressure, follow the instructions in the manual that came with your blood pressure monitor. If you have a digital blood pressure monitor, the instructions may be as follows: Sit up straight in a chair. Place your feet on the floor. Do not cross your ankles or legs. Rest your left arm at the level of your heart on a table or desk or on the arm of a chair. Pull up your shirt sleeve. Wrap the blood pressure cuff around the upper part of your left arm, 1 inch (2.5 cm) above your elbow. It is best to wrap the cuff around bare skin. Fit the cuff snugly, but not too tightly, around your arm. You should be able to place only one finger between the cuff and your arm. Position the cord so that it rests in the bend of your elbow. Press the power button. Sit quietly while the cuff inflates and deflates. Read the digital reading on the monitor screen and write the numbers down (record them) in a notebook. Wait 2-3 minutes, then repeat the steps, starting at step 1. What does my blood pressure reading mean? A blood pressure reading consists of a higher number over a lower number. Ideally, your blood pressure should be below 120/80. The first ("top") number  is called the systolic pressure. It is a measure of the pressure in your arteries as your heart beats. The second ("bottom") number is called the diastolic pressure. It is a measure of the pressure in your arteries as the heart relaxes. Blood pressure is classified into four stages. The following are the stages for adults who do not have a short-term serious illness or a chronic condition. Systolic pressure and diastolic pressure are measured in a unit called mm Hg (millimeters of mercury).   Normal Systolic pressure: below 270. Diastolic pressure: below 80. Elevated Systolic pressure: 623-762. Diastolic pressure: below 80. Hypertension stage 1 Systolic pressure: 831-517. Diastolic pressure: 61-60. Hypertension stage 2 Systolic pressure: 737 or above. Diastolic pressure: 90 or above. You can have elevated blood pressure or hypertension even if only the systolic or only the diastolic number in your reading is higher than normal. Follow these instructions at home: Medicines Take over-the-counter and prescription medicines only as told by your health care provider. Tell your health care provider if you are having any side effects from blood pressure medicine. General instructions Check your blood pressure as often as recommended by your health care provider. Check your blood pressure at the same time every day. Take your monitor to the next appointment with your health care provider to make sure that: You are using it correctly. It provides accurate readings. Understand what your goal blood pressure numbers are. Keep all follow-up visits. This is important. General tips Your health care provider can suggest a reliable monitor that will meet your needs. There are several types of home blood pressure monitors. Choose a monitor that has an arm cuff. Do not choose a monitor that measures your blood pressure from your wrist or finger. Choose a cuff that wraps snugly, not too tight or too loose, around your upper arm. You should be able to fit only one finger between your arm and the cuff. You can buy a blood pressure monitor at most drugstores or online. Where to find more information American Heart Association: www.heart.org Contact a health care provider if: Your blood pressure is consistently high. Your blood pressure is suddenly low. Get help right away if: Your systolic blood pressure is higher than 180. Your diastolic blood pressure is higher than 120. These symptoms  may be an emergency. Get help right away. Call 911. Do not wait to see if the symptoms will go away. Do not drive yourself to the hospital. Summary Blood pressure is a measurement of how strongly your blood is pressing against the walls of your arteries. A blood pressure reading consists of a higher number over a lower number. Ideally, your blood pressure should be below 120/80. Check your blood pressure at the same time every day. Avoid caffeine, alcohol, smoking, and exercise for 30 minutes prior to checking your blood pressure. These agents can affect the accuracy of the blood pressure reading. This information is not intended to replace advice given to you by your health care provider. Make sure you discuss any questions you have with your health care provider. Document Revised: 07/20/2021 Document Reviewed: 07/20/2021 Elsevier Patient Education  [place].   Nonspecific Chest Pain, Adult Chest pain is an uncomfortable, tight, or painful feeling in the chest. The pain can feel like a crushing, aching, or squeezing pressure. A person can feel a burning or tingling sensation. Chest pain can also be felt in your back, neck, jaw, shoulder, or arm. This pain can be worse when you move, sneeze, or  take a deep breath. Chest pain can be caused by a condition that is life-threatening. This must be treated right away. It can also be caused by something that is not life-threatening. If you have chest pain, it can be hard to know the difference, so it is important to get help right away to make sure that you do not have a serious condition. Some life-threatening causes of chest pain include: Heart attack. A tear in the body's main blood vessel (aortic dissection). Inflammation around your heart (pericarditis). A problem in the lungs, such as a blood clot (pulmonary embolism) or a collapsed lung (pneumothorax). Some non life-threatening causes of chest pain include: Heartburn. Anxiety or  stress. Damage to the bones, muscles, and cartilage that make up your chest wall. Pneumonia or bronchitis. Shingles infection (varicella-zoster virus). Your chest pain may come and go. It may also be constant. Your health care provider will do tests and other studies to find the cause of your pain. Treatment will depend on the cause of your chest pain. Follow these instructions at home: Medicines Take over-the-counter and prescription medicines only as told by your health care provider. If you were prescribed an antibiotic medicine, take it as told by your health care provider. Do not stop taking the antibiotic even if you start to feel better. Activity Avoid any activities that cause chest pain. Do not lift anything that is heavier than 10 lb (4.5 kg), or the limit that you are told, until your health care provider says that it is safe. Rest as directed by your health care provider. Return to your normal activities only as told by your health care provider. Ask your health care provider what activities are safe for you. Lifestyle     Do not use any products that contain nicotine or tobacco, such as cigarettes, e-cigarettes, and chewing tobacco. If you need help quitting, ask your health care provider. Do not drink alcohol. Make healthy lifestyle changes as recommended. These may include: Getting regular exercise. Ask your health care provider to suggest some exercises that are safe for you. Eating a heart-healthy diet. This includes plenty of fresh fruits and vegetables, whole grains, low-fat (lean) protein, and low-fat dairy products. A dietitian can help you find healthy eating options. Maintaining a healthy weight. Managing any other health conditions you may have, such as high blood pressure (hypertension) or diabetes. Reducing stress, such as with yoga or relaxation techniques. General instructions Pay attention to any changes in your symptoms. It is up to you to get the results of  any tests that were done. Ask your health care provider, or the department that is doing the tests, when your results will be ready. Keep all follow-up visits as told by your health care provider. This is important. You may be asked to go for further testing if your chest pain does not go away. Contact a health care provider if: Your chest pain does not go away. You feel depressed. You have a fever. You notice changes in your symptoms or develop new symptoms. Get help right away if: Your chest pain gets worse. You have a cough that gets worse, or you cough up blood. You have severe pain in your abdomen. You faint. You have sudden, unexplained chest discomfort. You have sudden, unexplained discomfort in your arms, back, neck, or jaw. You have shortness of breath at any time. You suddenly start to sweat, or your skin gets clammy. You feel nausea or you vomit. You suddenly feel lightheaded or  dizzy. You have severe weakness, or unexplained weakness or fatigue. Your heart begins to beat quickly, or it feels like it is skipping beats. These symptoms may represent a serious problem that is an emergency. Do not wait to see if the symptoms will go away. Get medical help right away. Call your local emergency services (911 in the [place] Do not drive yourself to the hospital. Summary Chest pain can be caused by a condition that is serious and requires urgent treatment. It may also be caused by something that is not life-threatening. Your health care provider may do lab tests and other studies to find the cause of your pain. Follow your health care provider's instructions on taking medicines, making lifestyle changes, and getting emergency treatment if symptoms become worse. Keep all follow-up visits as told by your health care provider. This includes visits for any further testing if your chest pain does not go away. This information is not intended to replace advice given to you by your health care  provider. Make sure you discuss any questions you have with your health care provider. Document Revised: 01/19/2021 Document Reviewed: 01/19/2021 Elsevier Patient Education  [place].

## 2022-06-19 ENCOUNTER — Ambulatory Visit (HOSPITAL_BASED_OUTPATIENT_CLINIC_OR_DEPARTMENT_OTHER): Payer: 59 | Admitting: Anesthesiology

## 2022-06-19 ENCOUNTER — Encounter (HOSPITAL_COMMUNITY)
Admission: RE | Disposition: A | Discharge status: Home / Self Care | Payer: Self-pay | Source: Home / Self Care | Attending: Gastroenterology

## 2022-06-19 ENCOUNTER — Ambulatory Visit (HOSPITAL_COMMUNITY): Payer: 59 | Admitting: Anesthesiology

## 2022-06-19 ENCOUNTER — Ambulatory Visit (HOSPITAL_COMMUNITY)
Admission: RE | Admit: 2022-06-19 | Discharge: 2022-06-19 | Disposition: A | Discharge status: Home / Self Care | Payer: 59 | Attending: Gastroenterology | Admitting: Gastroenterology

## 2022-06-19 ENCOUNTER — Other Ambulatory Visit: Payer: Self-pay

## 2022-06-19 ENCOUNTER — Encounter (HOSPITAL_COMMUNITY): Payer: Self-pay | Admitting: Gastroenterology

## 2022-06-19 DIAGNOSIS — K5730 Diverticulosis of large intestine without perforation or abscess without bleeding: Secondary | ICD-10-CM

## 2022-06-19 DIAGNOSIS — I10 Essential (primary) hypertension: Secondary | ICD-10-CM | POA: Insufficient documentation

## 2022-06-19 DIAGNOSIS — Z1211 Encounter for screening for malignant neoplasm of colon: Secondary | ICD-10-CM | POA: Diagnosis not present

## 2022-06-19 DIAGNOSIS — Z6841 Body Mass Index (BMI) 40.0 and over, adult: Secondary | ICD-10-CM | POA: Insufficient documentation

## 2022-06-19 DIAGNOSIS — D123 Benign neoplasm of transverse colon: Secondary | ICD-10-CM | POA: Diagnosis not present

## 2022-06-19 DIAGNOSIS — K635 Polyp of colon: Secondary | ICD-10-CM | POA: Diagnosis not present

## 2022-06-19 DIAGNOSIS — D124 Benign neoplasm of descending colon: Secondary | ICD-10-CM | POA: Diagnosis not present

## 2022-06-19 HISTORY — PX: POLYPECTOMY: SHX5525

## 2022-06-19 HISTORY — PX: COLONOSCOPY WITH PROPOFOL: SHX5780

## 2022-06-19 SURGERY — COLONOSCOPY WITH PROPOFOL
Anesthesia: Monitor Anesthesia Care

## 2022-06-19 MED ORDER — ONDANSETRON HCL 4 MG/2ML IJ SOLN
INTRAMUSCULAR | Status: DC | PRN
  Administered 2022-06-19: 4 mg via INTRAVENOUS

## 2022-06-19 MED ORDER — PROPOFOL 10 MG/ML IV BOLUS
INTRAVENOUS | Status: DC | PRN
  Administered 2022-06-19: 50 mg via INTRAVENOUS
  Administered 2022-06-19: 20 mg via INTRAVENOUS
  Administered 2022-06-19 (×2): 50 mg via INTRAVENOUS

## 2022-06-19 MED ORDER — LIDOCAINE 2% (20 MG/ML) 5 ML SYRINGE
INTRAMUSCULAR | Status: DC | PRN
  Administered 2022-06-19: 60 mg via INTRAVENOUS

## 2022-06-19 MED ORDER — SODIUM CHLORIDE 0.9 % IV SOLN: INTRAVENOUS | Status: DC

## 2022-06-19 MED ORDER — LACTATED RINGERS IV SOLN: INTRAVENOUS | Status: DC

## 2022-06-19 MED ORDER — PROPOFOL 500 MG/50ML IV EMUL
INTRAVENOUS | Status: DC | PRN
  Administered 2022-06-19: 125 ug/kg/min via INTRAVENOUS

## 2022-06-19 SURGICAL SUPPLY — 22 items

## 2022-06-19 NOTE — H&P (Signed)
[facility] History and Physical   Primary Care Physician:  [name], MD   Reason for Procedure:   Colon cancer screening  Plan:    Screening colonoscopy     HPI: [name] is a 51 y.o. female undergoing initial average risk screening colonoscopy.  She has no family history of colon cancer and no chronic GI symptoms.    Past Medical History:  Diagnosis Date   Anemia    Difficult airway for intubation    Pt.denies updated 06/12/22   Headache(784.0)    Hypertension    PONV (postoperative nausea and vomiting)     Past Surgical History:  Procedure Laterality Date   DILATION AND CURETTAGE OF UTERUS  2000   DILITATION & CURRETTAGE/HYSTROSCOPY WITH NOVASURE ABLATION N/A 02/05/2013   Procedure: DILATATION & CURETTAGE/HYSTEROSCOPY WITH NOVASURE ABLATION;  Surgeon: Margarette Asal, MD;  Location: [facility];  Service: Gynecology;  Laterality: N/A;   LAPAROSCOPIC TUBAL LIGATION Bilateral 02/05/2013   Procedure: LAPAROSCOPIC TUBAL LIGATION;  Surgeon: Margarette Asal, MD;  Location: [facility];  Service: Gynecology;  Laterality: Bilateral;  attempted tubal ligation    Prior to Admission medications   Medication Sig Start Date End Date Taking? Authorizing Provider  amLODipine (NORVASC) 5 MG tablet Take 1 tablet (5 mg total) by mouth daily. 05/11/22  Yes [name], MD  Ascorbic Acid (VITAMIN C PO) Take 1 tablet by mouth in the morning.   Yes [provider]  Biotin 10000 MCG TABS Take by mouth daily.   Yes [provider]  Cholecalciferol (VITAMIN D3 PO) Take by mouth daily. 2000 iu   Yes [provider]  Multiple Vitamin (MULTIVITAMIN WITH MINERALS) TABS tablet Take 1 tablet by mouth in the morning. Centrum for Women 50+   Yes [provider]  Blood Pressure Monitoring (BLOOD PRESSURE MONITOR 3) DEVI Use as directed. 04/09/22       Current Facility-Administered Medications  Medication Dose Route Frequency Provider Last  Rate Last Admin   0.9 %  sodium chloride infusion   Intravenous Continuous Daryel November, MD       lactated ringers infusion   Intravenous Continuous Daryel November, MD 10 mL/hr at 06/19/22 1025 New Bag at 06/19/22 1025    Allergies as of 06/01/2022   (No Known Allergies)    Family History  Problem Relation Age of Onset   Cancer Sister    Breast cancer Sister    Colon cancer Neg Hx    Esophageal cancer Neg Hx    Pancreatic cancer Neg Hx    Stomach cancer Neg Hx    Heart disease Neg Hx    Diabetes Neg Hx    Colon polyps Neg Hx    Crohn's disease Neg Hx    Rectal cancer Neg Hx     Social History   Socioeconomic History   Marital status: Married    Spouse name: Not on file   Number of children: Not on file   Years of education: Not on file   Highest education level: Not on file  Occupational History   Not on file  Tobacco Use   Smoking status: Never    Passive exposure: Never   Smokeless tobacco: Never  Vaping Use   Vaping Use: Never used  Substance and Sexual Activity   Alcohol use: Yes    Comment: occas   Drug use: No   Sexual activity: Yes  Other Topics Concern   Not on file  Social History Narrative   Not on file   Social Determinants of Health   Financial Resource Strain: Not on file  Food Insecurity: Not on file  Transportation Needs: Not on file  Physical Activity: Not on file  Stress: Not on file  Social Connections: Not on file  Intimate Partner Violence: Not on file    Review of Systems:  All other review of systems negative except as mentioned in the HPI.  Physical Exam: Vital signs BP (!) 180/75   Pulse 88   Temp 98.3 F (36.8 C) (Oral)   Resp 14   Ht '5\' 2"'$  (1.575 m)   Wt 129.7 kg   SpO2 100%   BMI 52.31 kg/m   General:   Alert,  Well-developed, well-nourished, pleasant and cooperative in NAD Airway:  Mallampati 2 Lungs:  Clear throughout to auscultation.   Heart:  Regular rate and rhythm; no murmurs, clicks, rubs,   or gallops. Abdomen:  Soft, nontender and nondistended. Normal bowel sounds.   Neuro/Psych:  Normal mood and affect. A and O x 3   Leaner Morici E. Candis Schatz, MD [facility]

## 2022-06-19 NOTE — Transfer of Care (Signed)
Immediate Anesthesia Transfer of Care Note  Patient: [name]  Procedure(s) Performed: COLONOSCOPY WITH PROPOFOL POLYPECTOMY  Patient Location: PACU  Anesthesia Type:MAC  Level of Consciousness: sedated  Airway & Oxygen Therapy: Patient Spontanous Breathing and Patient connected to face mask oxygen  Post-op Assessment: Report given to RN and Post -op Vital signs reviewed and stable  Post vital signs: Reviewed and stable  Last Vitals:  Vitals Value Taken Time  BP 100/85 06/19/22 1150  Temp    Pulse 89 06/19/22 1150  Resp 16 06/19/22 1150  SpO2 100 % 06/19/22 1150  Vitals shown include unvalidated device data.  Last Pain:  Vitals:   06/19/22 0959  TempSrc: Oral         Complications: No notable events documented.

## 2022-06-19 NOTE — Discharge Instructions (Signed)

## 2022-06-19 NOTE — Op Note (Signed)
[facility] Patient Name: [name] Procedure Date: 06/19/2022 MRN: [id number] Attending MD: Gladstone Pih. Candis Schatz , MD Date of Birth: March 17, 1971 CSN: [id number] Age: 51 Admit Type: Outpatient Procedure:                Colonoscopy Indications:              Screening for colorectal malignant neoplasm, This                            is the patient's first colonoscopy Providers:                Nicki Reaper E. Candis Schatz, MD, Burtis Junes, RN, Frazier Richards, Technician, Marla Roe, CRNA Referring MD:              Medicines:                Monitored Anesthesia Care Complications:            No immediate complications. Estimated Blood Loss:     Estimated blood loss was minimal. Procedure:                Pre-Anesthesia Assessment:                           - Prior to the procedure, a History and Physical                            was performed, and patient medications and                            allergies were reviewed. The patient's tolerance of                            previous anesthesia was also reviewed. The risks                            and benefits of the procedure and the sedation                            options and risks were discussed with the patient.                            All questions were answered, and informed consent                            was obtained. Prior Anticoagulants: The patient has                            taken no previous anticoagulant or antiplatelet                            agents. ASA Grade Assessment: III - A patient with  severe systemic disease. After reviewing the risks                            and benefits, the patient was deemed in                            satisfactory condition to undergo the procedure.                           After obtaining informed consent, the colonoscope                            was passed under direct vision. Throughout the                             procedure, the patient's blood pressure, pulse, and                            oxygen saturations were monitored continuously. The                            [id number]-HQ190L ([id number]) Olympus colonoscope was                            introduced through the anus and advanced to the the                            cecum, identified by appendiceal orifice and                            ileocecal valve. The colonoscopy was performed                            without difficulty. The patient tolerated the                            procedure well. The quality of the bowel                            preparation was good. The ileocecal valve,                            appendiceal orifice, and rectum were photographed. Scope In: 11:28:16 AM Scope Out: 11:44:07 AM Scope Withdrawal Time: 0 hours 7 minutes 39 seconds  Total Procedure Duration: 0 hours 15 minutes 51 seconds  Findings:      The perianal and digital rectal examinations were normal. Pertinent       negatives include normal sphincter tone and no palpable rectal lesions.      A 6 mm polyp was found in the distal transverse colon. The polyp was       sessile. The polyp was removed with a cold snare. Resection and       retrieval were complete. Estimated blood loss was minimal.      A few small-mouthed diverticula were found in the ascending colon. There  was no evidence of diverticular bleeding.      The exam was otherwise normal throughout the examined colon.      The retroflexed view of the distal rectum and anal verge was normal and       showed no anal or rectal abnormalities. Impression:               - One 6 mm polyp in the distal transverse colon,                            removed with a cold snare. Resected and retrieved.                           - Mild diverticulosis in the ascending colon. There                            was no evidence of diverticular bleeding.                           - The distal rectum and anal  verge are normal on                            retroflexion view. Moderate Sedation:      Not Applicable - Patient had care per Anesthesia. Recommendation:           - Patient has a contact number available for                            emergencies. The signs and symptoms of potential                            delayed complications were discussed with the                            patient. Return to normal activities tomorrow.                            Written discharge instructions were provided to the                            patient.                           - Resume previous diet.                           - Continue present medications.                           - Await pathology results.                           - Repeat colonoscopy (date not yet determined) for                            surveillance based on pathology results. Procedure Code(s):        ---  Professional ---                           [phone], Colonoscopy, flexible; with removal of                            tumor(s), polyp(s), or other lesion(s) by snare                            technique Diagnosis Code(s):        --- Professional ---                           Z12.11, Encounter for screening for malignant                            neoplasm of colon                           K63.5, Polyp of colon                           K57.30, Diverticulosis of large intestine without                            perforation or abscess without bleeding CPT copyright 2019 American Medical Association. All rights reserved. The codes documented in this report are preliminary and upon coder review may  be revised to meet current compliance requirements. Tammie Ellsworth E. Candis Schatz, MD 06/19/2022 11:48:48 AM This report has been signed electronically. Number of Addenda: 0

## 2022-06-19 NOTE — Anesthesia Procedure Notes (Signed)
Procedure Name: MAC Date/Time: 06/19/2022 11:24 AM  Performed by: Talbot Grumbling, CRNAPre-anesthesia Checklist: Patient identified, Emergency Drugs available, Suction available and Patient being monitored Patient Re-evaluated:Patient Re-evaluated prior to induction Oxygen Delivery Method: Simple face mask Preoxygenation: Pre-oxygenation with 100% oxygen Induction Type: IV induction Placement Confirmation: positive ETCO2 and breath sounds checked- equal and bilateral Dental Injury: Teeth and Oropharynx as per pre-operative assessment

## 2022-06-19 NOTE — Anesthesia Postprocedure Evaluation (Signed)
Anesthesia Post Note  Patient: [name]  Procedure(s) Performed: COLONOSCOPY WITH PROPOFOL POLYPECTOMY     Patient location during evaluation: Endoscopy Anesthesia Type: MAC Level of consciousness: awake and alert Pain management: pain level controlled Vital Signs Assessment: post-procedure vital signs reviewed and stable Respiratory status: spontaneous breathing, nonlabored ventilation, respiratory function stable and patient connected to nasal cannula oxygen Cardiovascular status: blood pressure returned to baseline and stable Postop Assessment: no apparent nausea or vomiting Anesthetic complications: no   No notable events documented.  Last Vitals:  Vitals:   06/19/22 1200 06/19/22 1210  BP: 104/72   Pulse: 81 73  Resp: 17 13  Temp:    SpO2: 100% 100%    Last Pain:  Vitals:   06/19/22 1210  TempSrc:   PainSc: 0-No pain                 Barnet Glasgow

## 2022-06-20 LAB — SURGICAL PATHOLOGY

## 2022-06-26 NOTE — Progress Notes (Signed)
Ms. [name], [name] news: the polyp (or polyps) that I removed during your recent examination were NOT precancerous.  You should continue to follow current colorectal cancer screening guidelines with a repeat colonoscopy in 10 years.    If you develop any new rectal bleeding, abdominal pain or significant bowel habit changes, please contact me before then.

## 2022-06-27 DIAGNOSIS — Z23 Encounter for immunization: Secondary | ICD-10-CM | POA: Diagnosis not present

## 2022-07-04 ENCOUNTER — Encounter (HOSPITAL_BASED_OUTPATIENT_CLINIC_OR_DEPARTMENT_OTHER): Payer: Self-pay

## 2022-07-04 DIAGNOSIS — I10 Essential (primary) hypertension: Secondary | ICD-10-CM | POA: Insufficient documentation

## 2022-07-04 DIAGNOSIS — Z9889 Other specified postprocedural states: Secondary | ICD-10-CM | POA: Insufficient documentation

## 2022-07-04 DIAGNOSIS — D649 Anemia, unspecified: Secondary | ICD-10-CM | POA: Insufficient documentation

## 2022-07-04 DIAGNOSIS — E1159 Type 2 diabetes mellitus with other circulatory complications: Secondary | ICD-10-CM | POA: Insufficient documentation

## 2022-07-10 ENCOUNTER — Ambulatory Visit (HOSPITAL_BASED_OUTPATIENT_CLINIC_OR_DEPARTMENT_OTHER): Payer: 59 | Admitting: Cardiology

## 2022-07-10 ENCOUNTER — Other Ambulatory Visit (HOSPITAL_COMMUNITY): Payer: Self-pay

## 2022-07-10 ENCOUNTER — Encounter (HOSPITAL_BASED_OUTPATIENT_CLINIC_OR_DEPARTMENT_OTHER): Payer: Self-pay | Admitting: Cardiology

## 2022-07-10 VITALS — BP 173/81 | HR 91 | Ht 62.0 in | Wt 284.8 lb

## 2022-07-10 DIAGNOSIS — R030 Elevated blood-pressure reading, without diagnosis of hypertension: Secondary | ICD-10-CM

## 2022-07-10 DIAGNOSIS — Z01812 Encounter for preprocedural laboratory examination: Secondary | ICD-10-CM

## 2022-07-10 DIAGNOSIS — E119 Type 2 diabetes mellitus without complications: Secondary | ICD-10-CM | POA: Diagnosis not present

## 2022-07-10 DIAGNOSIS — I10 Essential (primary) hypertension: Secondary | ICD-10-CM

## 2022-07-10 DIAGNOSIS — R072 Precordial pain: Secondary | ICD-10-CM

## 2022-07-10 DIAGNOSIS — Z7189 Other specified counseling: Secondary | ICD-10-CM | POA: Diagnosis not present

## 2022-07-10 MED ORDER — METOPROLOL TARTRATE 100 MG PO TABS
100.0000 mg | ORAL_TABLET | ORAL | 0 refills | Status: DC
  Filled 2022-07-10: qty 1, 1d supply, fill #0

## 2022-07-10 MED ORDER — OZEMPIC (1 MG/DOSE) 4 MG/3ML ~~LOC~~ SOPN
PEN_INJECTOR | SUBCUTANEOUS | 0 refills | Status: AC
  Filled 2022-07-10: qty 3, 35d supply, fill #0
  Filled 2022-07-18: qty 6, 70d supply, fill #0

## 2022-07-10 NOTE — Progress Notes (Signed)
Cardiology Office Note:    Date:  07/10/2022   ID:  [name], DOB October 15, 1971, MRN [id number]  PCP:  [name], MD  Cardiologist:  Buford Dresser, MD  Referring MD: [name], MD   CC: new patient consultation for chest pain  History of Present Illness:    [name] is a 51 y.o. female with a hx of hypertension, prediabetes, and anemia who is seen as a new consult at the request of [name], MD for the evaluation and management of chest pain.  She was seen by Dr. Carlota Raspberry on 06/18/2022 and reported sharp chest pain occurring about once per week. Given her other cardiac risk factors, history of prediabetes and chest pain she was referred to cardiology for full evaluation.   Cardiovascular risk factors: Prior clinical ASCVD: none that she is aware of Comorbid conditions: hypertension - She reports no additional cardiac symptoms she is experiencing. Started the antihypertensive medication about 2 months ago. Blood pressure has been well controlled on medication. In office today blood pressure was 173/81. Prediabetes - Last A1c showed that she was clinically diabetic; she reports that they will be rechecking in three months. Metabolic syndrome/Obesity: She reports her highest adult weight as 287 lbs, but it does tend to fluctuate. Chronic inflammatory conditions:none Tobacco use history:Never Family history: None Prior cardiac testing and/or incidental findings on other testing (ie coronary calcium): Exercise level:Walking is her main exercise, takes breaks on inclines when necessary but is able to continue after resting. Current diet: Reports that she eats chicken and rice. Mostly eats at home, rarely goes out to eat.   She reports that the chest pain comes in episodes every once in awhile. It feels like a small pinch in the left side of the chest. She notices it mostly when she is working and cooking. Palpation and applying pressure to the area does not  seem to change the pain. At times the pain will come and go quickly but at times it will linger. She reports nothing seems to alleviate or worsen her pain. She denies any lightheadedness or SOB with the episodes.  She reports that at times the bottom of her feet feel like she is walking on pins. She says she has to be on her feet for most of the day for her job.   She denies any palpitations, shortness of breath, or peripheral edema. No lightheadedness, headaches, syncope, orthopnea, or PND.  Past Medical History:  Diagnosis Date   Anemia    Difficult airway for intubation    Pt.denies updated 06/12/22   Headache(784.0)    Hypertension    PONV (postoperative nausea and vomiting)     Past Surgical History:  Procedure Laterality Date   COLONOSCOPY WITH PROPOFOL N/A 06/19/2022   Procedure: COLONOSCOPY WITH PROPOFOL;  Surgeon: Daryel November, MD;  Location: [facility];  Service: Gastroenterology;  Laterality: N/A;   DILATION AND CURETTAGE OF UTERUS  2000   DILITATION & CURRETTAGE/HYSTROSCOPY WITH NOVASURE ABLATION N/A 02/05/2013   Procedure: DILATATION & CURETTAGE/HYSTEROSCOPY WITH NOVASURE ABLATION;  Surgeon: Margarette Asal, MD;  Location: [facility];  Service: Gynecology;  Laterality: N/A;   LAPAROSCOPIC TUBAL LIGATION Bilateral 02/05/2013   Procedure: LAPAROSCOPIC TUBAL LIGATION;  Surgeon: Margarette Asal, MD;  Location: [facility];  Service: Gynecology;  Laterality: Bilateral;  attempted tubal ligation   POLYPECTOMY  06/19/2022   Procedure: POLYPECTOMY;  Surgeon: Daryel November, MD;  Location: [facility];  Service: Gastroenterology;;  Current Medications: Current Outpatient Medications on File Prior to Visit  Medication Sig   amLODipine (NORVASC) 5 MG tablet Take 1 tablet (5 mg total) by mouth daily.   Ascorbic Acid (VITAMIN C PO) Take 1 tablet by mouth in the morning.   Biotin 10000 MCG TABS Take by mouth daily.   Blood Pressure Monitoring (BLOOD PRESSURE MONITOR 3) DEVI Use  as directed.   Cholecalciferol (VITAMIN D3 PO) Take by mouth daily. 2000 iu   Multiple Vitamin (MULTIVITAMIN WITH MINERALS) TABS tablet Take 1 tablet by mouth in the morning. Centrum for Women 50+   No current facility-administered medications on file prior to visit.     Allergies:   Lactose intolerance (gi)   Social History   Tobacco Use   Smoking status: Never    Passive exposure: Never   Smokeless tobacco: Never  Vaping Use   Vaping Use: Never used  Substance Use Topics   Alcohol use: Yes    Comment: occas   Drug use: No    Family History: family history includes Breast cancer in her sister; Cancer in her sister. There is no history of Colon cancer, Esophageal cancer, Pancreatic cancer, Stomach cancer, Heart disease, Diabetes, Colon polyps, Crohn's disease, or Rectal cancer.  ROS:   Please see the history of present illness.  Additional pertinent ROS: Constitutional: Negative for chills, fever, night sweats, unintentional weight loss  HENT: Negative for ear pain and hearing loss.   Eyes: Negative for loss of vision and eye pain.  Respiratory: Negative for cough, sputum, wheezing.   Cardiovascular: See HPI. Gastrointestinal: Negative for abdominal pain, melena, and hematochezia.  Genitourinary: Negative for dysuria and hematuria.  Musculoskeletal: Negative for falls and myalgias.  Skin: Negative for itching and rash.  Neurological: Negative for focal weakness, and loss of consciousness. Positive for tingling tensations in soles of left foot  Endo/Heme/Allergies: Does not bruise/bleed easily.     EKGs/Labs/Other Studies Reviewed:    The following studies were reviewed today:  No prior cardiovascular studies available.   EKG:  EKG is personally reviewed.   07/10/2022: NSR 91, iRBBB, nonspecific t wave pattern  Recent Labs: 04/19/2022: ALT 18; BUN 13; Creatinine, Ser 0.68; Potassium 4.1; Sodium 138; TSH 3.44   Recent Lipid Panel    Component Value Date/Time   CHOL  156 04/19/2022 1020   CHOL 151 02/29/2020 0940   TRIG 92.0 04/19/2022 1020   HDL 46.70 04/19/2022 1020   HDL 47 02/29/2020 0940   CHOLHDL 3 04/19/2022 1020   VLDL 18.4 04/19/2022 1020   LDLCALC 91 04/19/2022 1020   LDLCALC 92 02/29/2020 0940    Physical Exam:    VS:  BP (!) 173/81 (BP Location: Right Arm, Patient Position: Sitting, Cuff Size: Normal)   Pulse 91   Ht '5\' 2"'$  (1.575 m)   Wt 284 lb 12.8 oz (129.2 kg)   LMP 02/06/2022 (Approximate)   BMI 52.09 kg/m     Wt Readings from Last 3 Encounters:  07/10/22 284 lb 12.8 oz (129.2 kg)  06/19/22 286 lb (129.7 kg)  06/18/22 286 lb 6.4 oz (129.9 kg)    GEN: Well nourished, well developed in no acute distress HEENT: Normal, moist mucous membranes NECK: No JVD CARDIAC: regular rhythm, normal S1 and S2, no rubs or gallops. No murmur. VASCULAR: Radial and DP pulses 2+ bilaterally. No carotid bruits RESPIRATORY:  Clear to auscultation without rales, wheezing or rhonchi  ABDOMEN: Soft, non-tender, non-distended MUSCULOSKELETAL:  Ambulates independently SKIN: Warm and dry, no  edema NEUROLOGIC:  Alert and oriented x 3. No focal neuro deficits noted. PSYCHIATRIC:  Normal affect    ASSESSMENT:    1. Type 2 diabetes mellitus without complication, without long-term current use of insulin (HCC)   2. Elevated blood pressure reading   3. Precordial pain   4. Pre-procedure lab exam   5. Morbid obesity ([place])   6. Cardiac risk counseling   7. Counseling on health promotion and disease prevention    PLAN:    Chest pain -discussed treadmill stress, nuclear stress/lexiscan, and CT coronary angiography. Discussed pros and cons of each, including but not limited to false positive/false negative risk, radiation risk, and risk of IV contrast dye. Based on shared decision making, decision was made to pursue CT coronary angiography. -will give one time dose of metoprolol 2 hours prior to scheduled test -counseled on need to get BMET prior to  test -counseled on use of sublingual nitroglycerin and its importance to a good test  -reviewed red flag warning signs that need immediate medical attention  Elevated BP reading -significantly elevated today, but last visit was 104/72. Monitor, would use home BP measurements if medication needed  Type II diabetes Obesity, BMI 52 -discussed GLP1RA and SGLT2i. Amenable to [place], will start semaglutide -Semaglutide starting dose plan: -Start with the 0.25 mg dose every week for 4 weeks, -Then use the 0.5 mg dose every week for 4 weeks, -Then continue with 1 mg dose every week. We will meet when you are on the 1 mg dose to discuss timing of uptitration.  Some nausea is normal, as is decreased appetite. If you have severe nausea or stomach pain, please contact us and stop the medication.   Teaching done with demo pen today and samples given today.   Cardiac risk counseling and prevention recommendations: -recommend heart healthy/Mediterranean diet, with whole grains, fruits, vegetable, fish, lean meats, nuts, and olive oil. Limit salt. -recommend moderate walking, 3-5 times/week for 30-50 minutes each session. Aim for at least 150 minutes.week. Goal should be pace of 3 miles/hours, or walking 1.5 miles in 30 minutes -recommend avoidance of tobacco products. Avoid excess alcohol. -ASCVD risk score: The 10-year ASCVD risk score (Arnett DK, et al., 2019) is: 26.2%   Values used to calculate the score:     Age: 46 years     Sex: Female     Is Non-Hispanic African American: Yes     Diabetic: Yes     Tobacco smoker: No     Systolic Blood Pressure: 063 mmHg     Is BP treated: Yes     HDL Cholesterol: 46.7 mg/dL     Total Cholesterol: 156 mg/dL    Plan for follow up: 2 months or sooner as needed.  Buford Dresser, MD, PhD, [facility]    Medication Adjustments/Labs and Tests Ordered: Current medicines are reviewed at length with the patient today.  Concerns  regarding medicines are outlined above.   Orders Placed This Encounter  Procedures   CT CORONARY MORPH W/CTA COR W/SCORE W/CA W/CM &/OR WO/CM   Basic metabolic panel   EKG 01-[facility]   Meds ordered this encounter  Medications   Semaglutide, 1 MG/DOSE, (OZEMPIC, 1 MG/DOSE,) 4 MG/3ML SOPN    Sig: Inject 0.5 mg into the skin once a week for 28 days, THEN 1 mg once a week.    Dispense:  6 mL    Refill:  0   metoprolol tartrate (LOPRESSOR) 100 MG tablet  Sig: Take 1 tablet (100 mg total) by mouth 1 hour prior to cardiac procedure.    Dispense:  1 tablet    Refill:  0   Patient Instructions  Medication Instructions:  Semaglutide starting dose plan: -Start with the 0.25 mg dose every week for 4 weeks, -Then use the 0.5 mg dose every week for 4 weeks  Some nausea is normal, as is decreased appetite. If you have severe nausea or stomach pain, please contact us and stop the medication.   Teaching done with demo pen today and samples given today.  *If you need a refill on your cardiac medications before your next appointment, please call your pharmacy*   Lab Work: Your provider has recommended lab work 1 week prior to test (BMP). Please have this collected at [facility] [place]. The lab is open 8:00 am - 4:30 pm. Please avoid 12:00p - 1:00p for lunch hour. You do not need an appointment. Please go to [address] [place], [place] [address]. This is in the Primary Care office on the 3rd floor, let them know you are there for blood work and they will direct you to the lab.  If you have labs (blood work) drawn today and your tests are completely normal, you will receive your results only by: [place] (if you have MyChart) OR A paper copy in the mail If you have any lab test that is abnormal or we need to change your treatment, we will call you to review the results.   Testing/Procedures: Cardiac CT Angiography (CTA), is a special type of CT scan that  uses a computer to produce multi-dimensional views of major blood vessels throughout the body. In CT angiography, a contrast material is injected through an IV to help visualize the blood vessels [facility]   Follow-Up: At [facility], you and your health needs are our priority.  As part of our continuing mission to provide you with exceptional heart care, we have created designated Provider Care Teams.  These Care Teams include your primary Cardiologist (physician) and Advanced Practice Providers (APPs -  Physician Assistants and Nurse Practitioners) who all work together to provide you with the care you need, when you need it.  We recommend signing up for the patient portal called "MyChart".  Sign up information is provided on this After Visit Summary.  MyChart is used to connect with patients for Virtual Visits (Telemedicine).  Patients are able to view lab/test results, encounter notes, upcoming appointments, etc.  Non-urgent messages can be sent to your provider as well.   To learn more about what you can do with MyChart, go to [email or web address].    Your next appointment:   2 month(s)  The format for your next appointment:   In Person  Provider:   Buford Dresser, MD    Your cardiac CT will be scheduled at one of the below locations:   [facility] [address] [place], [place] [address] [phone]   If scheduled at [facility], please arrive at the [facility] and Children's Entrance (Entrance C2) of [facility] 30 minutes prior to test start time. You can use the FREE valet parking offered at entrance C (encouraged to control the heart rate for the test)  Proceed to the [facility] Radiology Department (first floor) to check-in and test prep.  All radiology patients and guests should use entrance C2 at [facility], accessed from [facility], even though  the hospital's physical address listed is [address].    If scheduled at [facility], please arrive 15 mins early for check-in and test prep.  Please follow these instructions carefully (unless otherwise directed):  On the Night Before the Test: Be sure to Drink plenty of water. Do not consume any caffeinated/decaffeinated beverages or chocolate 12 hours prior to your test. Do not take any antihistamines 12 hours prior to your test.  On the Day of the Test: Drink plenty of water until 1 hour prior to the test. Do not eat any food 4 hours prior to the test. You may take your regular medications prior to the test.  Take metoprolol (Lopressor) 100 mg two hours prior to test. FEMALES- please wear underwire-free bra if available, avoid dresses & tight clothing       After the Test: Drink plenty of water. After receiving IV contrast, you may experience a mild flushed feeling. This is normal. On occasion, you may experience a mild rash up to 24 hours after the test. This is not dangerous. If this occurs, you can take Benadryl 25 mg and increase your fluid intake. If you experience trouble breathing, this can be serious. If it is severe call 911 IMMEDIATELY. If it is mild, please call our office. If you take any of these medications: Glipizide/Metformin, Avandament, Glucavance, please do not take 48 hours after completing test unless otherwise instructed.  We will call to schedule your test 2-4 weeks out understanding that some insurance companies will need an authorization prior to the service being performed.   For non-scheduling related questions, please contact the cardiac imaging nurse navigator should you have any questions/concerns: Marchia Bond, Cardiac Imaging Nurse Navigator Gordy Clement, Cardiac Imaging Nurse Navigator [facility] Direct Office Dial: [phone]   For scheduling needs, including cancellations and rescheduling, please call [place],  [phone]. {          I,Mathew Stumpf,acting as a Education administrator for [employer], MD.,have documented all relevant documentation on the behalf of Buford Dresser, MD,as directed by  Buford Dresser, MD while in the presence of Buford Dresser, MD.  I, Buford Dresser, MD, have reviewed all documentation for this visit. The documentation on 08/12/22 for the exam, diagnosis, procedures, and orders are all accurate and complete.   Signed, Buford Dresser, MD PhD 07/10/2022 5:06 PM    [place]

## 2022-07-10 NOTE — Patient Instructions (Addendum)
Medication Instructions:  Semaglutide starting dose plan: -Start with the 0.25 mg dose every week for 4 weeks, -Then use the 0.5 mg dose every week for 4 weeks  Some nausea is normal, as is decreased appetite. If you have severe nausea or stomach pain, please contact us and stop the medication.   Teaching done with demo pen today and samples given today.  *If you need a refill on your cardiac medications before your next appointment, please call your pharmacy*   Lab Work: Your provider has recommended lab work 1 week prior to test (BMP). Please have this collected at [facility]. The lab is open 8:00 am - 4:30 pm. Please avoid 12:00p - 1:00p for lunch hour. You do not need an appointment. Please go to [address] [place], [place] [address]. This is in the Primary Care office on the 3rd floor, let them know you are there for blood work and they will direct you to the lab.  If you have labs (blood work) drawn today and your tests are completely normal, you will receive your results only by: [place] (if you have MyChart) OR A paper copy in the mail If you have any lab test that is abnormal or we need to change your treatment, we will call you to review the results.   Testing/Procedures: Cardiac CT Angiography (CTA), is a special type of CT scan that uses a computer to produce multi-dimensional views of major blood vessels throughout the body. In CT angiography, a contrast material is injected through an IV to help visualize the blood vessels [facility]   Follow-Up: At [facility], you and your health needs are our priority.  As part of our continuing mission to provide you with exceptional heart care, we have created designated Provider Care Teams.  These Care Teams include your primary Cardiologist (physician) and Advanced Practice Providers (APPs -  Physician Assistants and Nurse Practitioners) who all work together to provide you  with the care you need, when you need it.  We recommend signing up for the patient portal called "MyChart".  Sign up information is provided on this After Visit Summary.  MyChart is used to connect with patients for Virtual Visits (Telemedicine).  Patients are able to view lab/test results, encounter notes, upcoming appointments, etc.  Non-urgent messages can be sent to your provider as well.   To learn more about what you can do with MyChart, go to [email or web address].    Your next appointment:   2 month(s)  The format for your next appointment:   In Person  Provider:   Buford Dresser, MD    Your cardiac CT will be scheduled at one of the below locations:   [facility] [address] [place], [place] [address] [phone]   If scheduled at [facility], please arrive at the [facility] and Children's Entrance (Entrance C2) of [facility] 30 minutes prior to test start time. You can use the FREE valet parking offered at entrance C (encouraged to control the heart rate for the test)  Proceed to the [facility] Radiology Department (first floor) to check-in and test prep.  All radiology patients and guests should use entrance C2 at [facility], accessed from [facility] even though the hospital's physical address listed is [address].    If scheduled at [facility], please arrive 15 mins early for check-in and test prep.  Please follow  these instructions carefully (unless otherwise directed):  On the Night Before the Test: Be sure to Drink plenty of water. Do not consume any caffeinated/decaffeinated beverages or chocolate 12 hours prior to your test. Do not take any antihistamines 12 hours prior to your test.  On the Day of the Test: Drink plenty of water until 1 hour prior to the test. Do not eat any food 4 hours prior to the test. You may take your regular medications prior to  the test.  Take metoprolol (Lopressor) 100 mg two hours prior to test. FEMALES- please wear underwire-free bra if available, avoid dresses & tight clothing       After the Test: Drink plenty of water. After receiving IV contrast, you may experience a mild flushed feeling. This is normal. On occasion, you may experience a mild rash up to 24 hours after the test. This is not dangerous. If this occurs, you can take Benadryl 25 mg and increase your fluid intake. If you experience trouble breathing, this can be serious. If it is severe call 911 IMMEDIATELY. If it is mild, please call our office. If you take any of these medications: Glipizide/Metformin, Avandament, Glucavance, please do not take 48 hours after completing test unless otherwise instructed.  We will call to schedule your test 2-4 weeks out understanding that some insurance companies will need an authorization prior to the service being performed.   For non-scheduling related questions, please contact the cardiac imaging nurse navigator should you have any questions/concerns: Marchia Bond, Cardiac Imaging Nurse Navigator Gordy Clement, Cardiac Imaging Nurse Navigator [facility] Direct Office Dial: [phone]   For scheduling needs, including cancellations and rescheduling, please call [place], [phone]. {

## 2022-07-18 ENCOUNTER — Other Ambulatory Visit (HOSPITAL_COMMUNITY): Payer: Self-pay

## 2022-07-20 ENCOUNTER — Other Ambulatory Visit (HOSPITAL_COMMUNITY): Payer: Self-pay

## 2022-07-24 ENCOUNTER — Other Ambulatory Visit (HOSPITAL_COMMUNITY): Payer: Self-pay

## 2022-07-26 ENCOUNTER — Telehealth (HOSPITAL_BASED_OUTPATIENT_CLINIC_OR_DEPARTMENT_OTHER): Payer: Self-pay

## 2022-07-26 NOTE — Telephone Encounter (Signed)
Prior authorization for Ozempic received from [employer] via [email or web address].  CMM Key:  [id number]  PA submitted

## 2022-08-23 ENCOUNTER — Telehealth (HOSPITAL_BASED_OUTPATIENT_CLINIC_OR_DEPARTMENT_OTHER): Payer: Self-pay | Admitting: Cardiology

## 2022-08-23 NOTE — Telephone Encounter (Signed)
Call attempt, no answer, unable to leave message will send follow up my chart message.

## 2022-08-23 NOTE — Telephone Encounter (Signed)
Patient calling with questions concerning her procedure on 10/10. Please advise

## 2022-08-27 ENCOUNTER — Telehealth (HOSPITAL_COMMUNITY): Payer: Self-pay | Admitting: *Deleted

## 2022-08-27 NOTE — Telephone Encounter (Signed)
Patient returning call regarding upcoming cardiac imaging study; pt verbalizes understanding of appt date/time, parking situation and where to check in, medications ordered, and verified current allergies; name and call back number provided for further questions should they arise  Deborah Clement RN Navigator Cardiac Imaging [facility] [phone] office [phone] cell  Patient to take '100mg'$  metoprolol tartrate two hours prior to her cardiac CT scan. She is aware to arrive at 3pm.

## 2022-08-27 NOTE — Telephone Encounter (Signed)
Attempted to call patient regarding upcoming cardiac CT appointment. °Left message on voicemail with name and callback number ° °Everlena Mackley RN Navigator Cardiac Imaging °[facility] °[phone] Office °[phone] Cell ° °

## 2022-08-28 ENCOUNTER — Ambulatory Visit (HOSPITAL_COMMUNITY)
Admission: RE | Admit: 2022-08-28 | Discharge: 2022-08-28 | Disposition: A | Discharge status: Home / Self Care | Payer: 59 | Source: Ambulatory Visit | Attending: Cardiology | Admitting: Cardiology

## 2022-08-28 DIAGNOSIS — R072 Precordial pain: Secondary | ICD-10-CM | POA: Insufficient documentation

## 2022-08-28 MED ORDER — METOPROLOL TARTRATE 5 MG/5ML IV SOLN
5.0000 mg | INTRAVENOUS | Status: DC | PRN
  Administered 2022-08-28 (×2): 5 mg via INTRAVENOUS

## 2022-08-28 MED ORDER — NITROGLYCERIN 0.4 MG SL SUBL
0.8000 mg | SUBLINGUAL_TABLET | Freq: Once | SUBLINGUAL | Status: AC
  Administered 2022-08-28: 0.8 mg via SUBLINGUAL

## 2022-08-28 MED ORDER — IOHEXOL 350 MG/ML SOLN
110.0000 mL | Freq: Once | INTRAVENOUS | Status: AC | PRN
  Administered 2022-08-28: 110 mL via INTRAVENOUS

## 2022-08-28 MED ORDER — METOPROLOL TARTRATE 5 MG/5ML IV SOLN
INTRAVENOUS | Status: AC
  Filled 2022-08-28: qty 10

## 2022-08-28 MED ORDER — NITROGLYCERIN 0.4 MG SL SUBL
SUBLINGUAL_TABLET | SUBLINGUAL | Status: AC
  Filled 2022-08-28: qty 2

## 2022-09-10 ENCOUNTER — Ambulatory Visit (HOSPITAL_BASED_OUTPATIENT_CLINIC_OR_DEPARTMENT_OTHER): Payer: 59 | Admitting: Cardiology

## 2022-09-10 ENCOUNTER — Encounter (HOSPITAL_BASED_OUTPATIENT_CLINIC_OR_DEPARTMENT_OTHER): Payer: Self-pay | Admitting: Cardiology

## 2022-09-10 VITALS — BP 122/84 | HR 87 | Ht 62.0 in | Wt 285.0 lb

## 2022-09-10 DIAGNOSIS — E119 Type 2 diabetes mellitus without complications: Secondary | ICD-10-CM

## 2022-09-10 DIAGNOSIS — R030 Elevated blood-pressure reading, without diagnosis of hypertension: Secondary | ICD-10-CM | POA: Diagnosis not present

## 2022-09-10 DIAGNOSIS — Z712 Person consulting for explanation of examination or test findings: Secondary | ICD-10-CM | POA: Diagnosis not present

## 2022-09-10 DIAGNOSIS — R0789 Other chest pain: Secondary | ICD-10-CM

## 2022-09-10 NOTE — Progress Notes (Signed)
Cardiology Office Note:    Date:  09/10/2022   ID:  [name], DOB [birth year], MRN [id number]  PCP:  [name], MD  Cardiologist:  Buford Dresser, MD  Referring MD: [name], MD   CC: follow up  History of Present Illness:    [name] is a 51 y.o. female with a hx of hypertension, prediabetes, and anemia who is seen for follow up today. I initially met her 07/10/22 as a new consult at the request of [name], MD for the evaluation and management of chest pain.  Cardiovascular risk factors: Prior clinical ASCVD: none that she is aware of Comorbid conditions: hypertension - She reports no additional cardiac symptoms she is experiencing. Started the antihypertensive medication about 2 months ago. Blood pressure has been well controlled on medication. In office today blood pressure was 173/81. Prediabetes - Last A1c showed that she was clinically diabetic; she reports that they will be rechecking in three months.  Today, she has been doing well but notes having some fluctuating weights at home. She was discontinued on the Ozempic due to having difficulties of getting it approved. She denied having reactions from it.   Recent blood pressure check (120/84 in clinic) and CTA was reviewed today.   She denies any palpitations, chest pain, shortness of breath, or peripheral edema. No lightheadedness, headaches, syncope, orthopnea, or PND.  Past Medical History:  Diagnosis Date   Anemia    Difficult airway for intubation    Pt.denies updated 06/12/22   Headache(784.0)    Hypertension    PONV (postoperative nausea and vomiting)     Past Surgical History:  Procedure Laterality Date   COLONOSCOPY WITH PROPOFOL N/A 06/19/2022   Procedure: COLONOSCOPY WITH PROPOFOL;  Surgeon: Daryel November, MD;  Location: [facility];  Service: Gastroenterology;  Laterality: N/A;   DILATION AND CURETTAGE OF UTERUS  2000   DILITATION & CURRETTAGE/HYSTROSCOPY WITH  NOVASURE ABLATION N/A 02/05/2013   Procedure: DILATATION & CURETTAGE/HYSTEROSCOPY WITH NOVASURE ABLATION;  Surgeon: Margarette Asal, MD;  Location: [facility];  Service: Gynecology;  Laterality: N/A;   LAPAROSCOPIC TUBAL LIGATION Bilateral 02/05/2013   Procedure: LAPAROSCOPIC TUBAL LIGATION;  Surgeon: Margarette Asal, MD;  Location: [facility];  Service: Gynecology;  Laterality: Bilateral;  attempted tubal ligation   POLYPECTOMY  06/19/2022   Procedure: POLYPECTOMY;  Surgeon: Daryel November, MD;  Location: [facility];  Service: Gastroenterology;;    Current Medications: Current Outpatient Medications on File Prior to Visit  Medication Sig   amLODipine (NORVASC) 5 MG tablet Take 1 tablet (5 mg total) by mouth daily.   Ascorbic Acid (VITAMIN C PO) Take 1 tablet by mouth in the morning.   Biotin 10000 MCG TABS Take by mouth daily.   Blood Pressure Monitoring (BLOOD PRESSURE MONITOR 3) DEVI Use as directed.   Cholecalciferol (VITAMIN D3 PO) Take by mouth daily. 2000 iu   Multiple Vitamin (MULTIVITAMIN WITH MINERALS) TABS tablet Take 1 tablet by mouth in the morning. Centrum for Women 50+   Semaglutide, 1 MG/DOSE, (OZEMPIC, 1 MG/DOSE,) 4 MG/3ML SOPN Inject 0.5 mg into the skin once a week for 28 days, THEN 1 mg once a week.   No current facility-administered medications on file prior to visit.     Allergies:   Lactose intolerance (gi)   Social History   Tobacco Use   Smoking status: Never    Passive exposure: Never   Smokeless tobacco: Never  Vaping Use  Vaping Use: Never used  Substance Use Topics   Alcohol use: Yes    Comment: occas   Drug use: No    Family History: family history includes Breast cancer in her sister; Cancer in her sister. There is no history of Colon cancer, Esophageal cancer, Pancreatic cancer, Stomach cancer, Heart disease, Diabetes, Colon polyps, Crohn's disease, or Rectal cancer.  ROS:   Please see the history of present illness. All other systems are  reviewed and negative.    EKGs/Labs/Other Studies Reviewed:    The following studies were reviewed today:  CT Coronary CTA 08/28/2022: FINDINGS: Non-cardiac: See separate report from [facility]. No significant findings on limited lung and soft tissue windows.   Calcium Score: No calcium noted  Coronary Arteries: Right dominant with no anomalies   LM: Normal LAD: Normal  D1: Small vessel normal  D2: Small vessel normal  Circumflex: Normal  OM1: Large high take off normal  AV Groove Normal  RCA: Normal  PDA: Normal  PLA: Normal   IMPRESSION: 1.  Calcium score 0  2.  Normal right dominant coronary arteries  3.  Normal ascending thoracic aorta 3.5 cm   EKG:  EKG is personally reviewed.   09/10/2022: EKG was not ordered. 07/10/2022: NSR 91, iRBBB, nonspecific t wave pattern  Recent Labs: 04/19/2022: ALT 18; BUN 13; Creatinine, Ser 0.68; Potassium 4.1; Sodium 138; TSH 3.44   Recent Lipid Panel    Component Value Date/Time   CHOL 156 04/19/2022 1020   CHOL 151 02/29/2020 0940   TRIG 92.0 04/19/2022 1020   HDL 46.70 04/19/2022 1020   HDL 47 02/29/2020 0940   CHOLHDL 3 04/19/2022 1020   VLDL 18.4 04/19/2022 1020   LDLCALC 91 04/19/2022 1020   LDLCALC 92 02/29/2020 0940    Physical Exam:    VS:  BP 122/84   Pulse 87   Ht '5\' 2"'$  (1.575 m)   Wt 285 lb (129.3 kg)   BMI 52.13 kg/m     Wt Readings from Last 3 Encounters:  09/10/22 285 lb (129.3 kg)  07/10/22 284 lb 12.8 oz (129.2 kg)  06/19/22 286 lb (129.7 kg)    GEN: Well nourished, well developed in no acute distress HEENT: Normal, moist mucous membranes NECK: No JVD CARDIAC: regular rhythm, normal S1 and S2, no rubs or gallops. No murmur. VASCULAR: Radial and DP pulses 2+ bilaterally. No carotid bruits RESPIRATORY:  Clear to auscultation without rales, wheezing or rhonchi  ABDOMEN: Soft, non-tender, non-distended MUSCULOSKELETAL:  Ambulates independently SKIN: Warm and dry, no edema NEUROLOGIC:   Alert and oriented x 3. No focal neuro deficits noted. PSYCHIATRIC:  Normal affect    ASSESSMENT:    1. Chest pain, non-cardiac   2. Type 2 diabetes mellitus without complication, without long-term current use of insulin ([place])   3. Morbid obesity ([place])   4. Elevated blood pressure reading   5. Encounter to discuss test results     PLAN:    Chest pain -reviewed CT results, no calcium, no coronary disease. Very reassuring -reviewed red flag warning signs that need immediate medical attention  Elevated BP reading -improves with recheck. If persistently elevated may need to start meds  Type II diabetes Obesity, BMI 52 -started on Ozempic but reports she could not get it approved despite diabetes diagnosis -she will continue to work on weight loss, retry GLP1RA at followup  Cardiac risk counseling and prevention recommendations: -recommend heart healthy/Mediterranean diet, with whole grains, fruits, vegetable, fish, lean meats, nuts,  and olive oil. Limit salt. -recommend moderate walking, 3-5 times/week for 30-50 minutes each session. Aim for at least 150 minutes.week. Goal should be pace of 3 miles/hours, or walking 1.5 miles in 30 minutes -recommend avoidance of tobacco products. Avoid excess alcohol.  Plan for follow up: 81-month or sooner as needed.  BBuford Dresser MD, PhD, [facility]    Medication Adjustments/Labs and Tests Ordered: Current medicines are reviewed at length with the patient today.  Concerns regarding medicines are outlined above.   No orders of the defined types were placed in this encounter.  No orders of the defined types were placed in this encounter.  Patient Instructions  Medication Instructions:  Your physician recommends that you continue on your current medications as directed. Please refer to the Current Medication list given to you today.   *If you need a refill on your cardiac medications before your next  appointment, please call your pharmacy*  Lab Work: NONE  Testing/Procedures: NONE  Follow-Up: [facility] you and your health needs are our priority.  As part of our continuing mission to provide you with exceptional heart care, we have created designated Provider Care Teams.  These Care Teams include your primary Cardiologist (physician) and Advanced Practice Providers (APPs -  Physician Assistants and Nurse Practitioners) who all work together to provide you with the care you need, when you need it.  We recommend signing up for the patient portal called "MyChart".  Sign up information is provided on this After Visit Summary.  MyChart is used to connect with patients for Virtual Visits (Telemedicine).  Patients are able to view lab/test results, encounter notes, upcoming appointments, etc.  Non-urgent messages can be sent to your provider as well.   To learn more about what you can do with MyChart, go to [email or web address]    Your next appointment:   6 month(s)  The format for your next appointment:   In Person  Provider:   BBuford Dresser MD          [facility],acting as a scribe for BBuford Dresser MD.,have documented all relevant documentation on the behalf of BBuford Dresser MD,as directed by  BBuford Dresser MD while in the presence of BBuford Dresser MD.  I, BBuford Dresser MD, have reviewed all documentation for this visit. The documentation on 09/10/22 for the exam, diagnosis, procedures, and orders are all accurate and complete.   Signed, BBuford Dresser MD PhD 09/10/2022     [place]

## 2022-09-10 NOTE — Patient Instructions (Signed)
Medication Instructions:  Your physician recommends that you continue on your current medications as directed. Please refer to the Current Medication list given to you today.   *If you need a refill on your cardiac medications before your next appointment, please call your pharmacy*  Lab Work: NONE  Testing/Procedures: NONE   Follow-Up: At [facility], you and your health needs are our priority.  As part of our continuing mission to provide you with exceptional heart care, we have created designated Provider Care Teams.  These Care Teams include your primary Cardiologist (physician) and Advanced Practice Providers (APPs -  Physician Assistants and Nurse Practitioners) who all work together to provide you with the care you need, when you need it.  We recommend signing up for the patient portal called "MyChart".  Sign up information is provided on this After Visit Summary.  MyChart is used to connect with patients for Virtual Visits (Telemedicine).  Patients are able to view lab/test results, encounter notes, upcoming appointments, etc.  Non-urgent messages can be sent to your provider as well.   To learn more about what you can do with MyChart, go to [email or web address].    Your next appointment:   6 month(s)  The format for your next appointment:   In Person  Provider:   Bridgette Christopher, MD             

## 2022-09-13 NOTE — Telephone Encounter (Signed)
Received a fax from pt's insurance stating ozempic was denied Pt made aware

## 2022-09-19 ENCOUNTER — Ambulatory Visit: Payer: 59 | Admitting: Family Medicine

## 2022-09-24 ENCOUNTER — Ambulatory Visit: Payer: 59 | Admitting: Family Medicine

## 2022-09-24 ENCOUNTER — Other Ambulatory Visit (HOSPITAL_COMMUNITY): Payer: Self-pay

## 2022-09-24 ENCOUNTER — Encounter: Payer: Self-pay | Admitting: Family Medicine

## 2022-09-24 VITALS — BP 140/90 | HR 94 | Temp 99.1°F | Ht 62.0 in | Wt 286.4 lb

## 2022-09-24 DIAGNOSIS — R7303 Prediabetes: Secondary | ICD-10-CM | POA: Insufficient documentation

## 2022-09-24 DIAGNOSIS — I10 Essential (primary) hypertension: Secondary | ICD-10-CM

## 2022-09-24 DIAGNOSIS — E119 Type 2 diabetes mellitus without complications: Secondary | ICD-10-CM

## 2022-09-24 DIAGNOSIS — R202 Paresthesia of skin: Secondary | ICD-10-CM | POA: Diagnosis not present

## 2022-09-24 DIAGNOSIS — Z862 Personal history of diseases of the blood and blood-forming organs and certain disorders involving the immune mechanism: Secondary | ICD-10-CM | POA: Insufficient documentation

## 2022-09-24 MED ORDER — AMLODIPINE BESYLATE 5 MG PO TABS
5.0000 mg | ORAL_TABLET | Freq: Every day | ORAL | 1 refills | Status: DC
  Filled 2022-09-24 – 2022-11-02 (×2): qty 90, 90d supply, fill #0

## 2022-09-24 NOTE — Patient Instructions (Addendum)
Blood pressure is running a little higher in recheck.  If blood pressures remain elevated at home I do recommend taking additional half pill of amlodipine each day for a total dose of 7.5 mg.  Let me know if you need to make that change so we can send in appropriate refills.  I will check the diabetes test and your electrolytes and let you know once we receive those results if medications are needed.  Unfortunately Ozempic was not covered previously, but can look at that or another medication option depending on lab results.  Try sleeping with arms out, not tucked in, and see if that helps with the tingling in the fingers.  If the symptoms continue, please schedule visit and we can look at other possible causes.  Return to the clinic or go to the nearest emergency room if any of your symptoms worsen or new symptoms occur.

## 2022-09-24 NOTE — Progress Notes (Signed)
Subjective:  Patient ID: [name], female    DOB: July 19, 1971  Age: 51 y.o. MRN: [id number]  CC:  Chief Complaint  Patient presents with   Prediabetes    Pt wants to know her A1C    Hypertension    HPI [name] presents for   Prediabetes: History of prediabetes, last A1c at diabetic level.  Increased from 5.9-6.8.  Recommended to initially cut back on carbohydrates, including rice.  No new meds were started.  Weight was same at 286 at her July 31 visit.  Less carbs more veggies since July visit.  Walking for exercise.  Lab Results  Component Value Date   HGBA1C 6.8 (A) 06/18/2022   Wt Readings from Last 3 Encounters:  09/24/22 286 lb 6.4 oz (129.9 kg)  09/10/22 285 lb (129.3 kg)  07/10/22 284 lb 12.8 oz (129.2 kg)   Hypertension: Treated with amlodipine 5 mg daily. Home readings:  higher on her meter 150/79.  Saw cardiology in 10/23 - Dr. Harrell Gave.  Started on Ozempic trial - took for 6 weeks in August. 0.'25mg'$  for 4 weeks, then unable to get - not covered. Prior auth submitted, but denied by insurance.    BP Readings from Last 3 Encounters:  09/24/22 122/78  09/10/22 122/84  08/28/22 135/73   Lab Results  Component Value Date   CREATININE 0.68 04/19/2022   Hand dysesthesias Fingertips sometimes in the morning for sleeping at night.  No neck pain.  Does not notice at other times during the day, no weakness.  Does sleep with arms tucked in.   History Patient Active Problem List   Diagnosis Date Noted   Prediabetes 09/24/2022   History of anemia 09/24/2022   PONV (postoperative nausea and vomiting) 07/04/2022   Hypertension 07/04/2022   Anemia 07/04/2022   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   Morbid obesity ([place]) 02/15/2020   Past Medical History:  Diagnosis Date   Anemia    Difficult airway for intubation    Pt.denies updated 06/12/22   Headache(784.0)    Hypertension    PONV (postoperative nausea and vomiting)    Past Surgical  History:  Procedure Laterality Date   COLONOSCOPY WITH PROPOFOL N/A 06/19/2022   Procedure: COLONOSCOPY WITH PROPOFOL;  Surgeon: Daryel November, MD;  Location: [facility];  Service: Gastroenterology;  Laterality: N/A;   DILATION AND CURETTAGE OF UTERUS  2000   DILITATION & CURRETTAGE/HYSTROSCOPY WITH NOVASURE ABLATION N/A 02/05/2013   Procedure: DILATATION & CURETTAGE/HYSTEROSCOPY WITH NOVASURE ABLATION;  Surgeon: Margarette Asal, MD;  Location: [facility];  Service: Gynecology;  Laterality: N/A;   LAPAROSCOPIC TUBAL LIGATION Bilateral 02/05/2013   Procedure: LAPAROSCOPIC TUBAL LIGATION;  Surgeon: Margarette Asal, MD;  Location: [facility];  Service: Gynecology;  Laterality: Bilateral;  attempted tubal ligation   POLYPECTOMY  06/19/2022   Procedure: POLYPECTOMY;  Surgeon: Daryel November, MD;  Location: [facility];  Service: Gastroenterology;;   Allergies  Allergen Reactions   Lactose Intolerance (Gi) Other (See Comments)    Cheese/milk products - GI upset   Prior to Admission medications   Medication Sig Start Date End Date Taking? Authorizing Provider  amLODipine (NORVASC) 5 MG tablet Take 1 tablet (5 mg total) by mouth daily. 05/11/22  Yes [name], MD  Ascorbic Acid (VITAMIN C PO) Take 1 tablet by mouth in the morning.   Yes [provider]  Biotin 10000 MCG TABS Take by mouth daily.   Yes [provider]  Blood  Pressure Monitoring (BLOOD PRESSURE MONITOR 3) DEVI Use as directed. 04/09/22  Yes   Cholecalciferol (VITAMIN D3 PO) Take by mouth daily. 2000 iu   Yes [provider]  Multiple Vitamin (MULTIVITAMIN WITH MINERALS) TABS tablet Take 1 tablet by mouth in the morning. Centrum for Women 50+   Yes [provider]   Social History   Socioeconomic History   Marital status: Married    Spouse name: Not on file   Number of children: Not on file   Years of education: Not on file   Highest education level: Not on file  Occupational  History   Not on file  Tobacco Use   Smoking status: Never    Passive exposure: Never   Smokeless tobacco: Never  Vaping Use   Vaping Use: Never used  Substance and Sexual Activity   Alcohol use: Yes    Comment: occas   Drug use: No   Sexual activity: Yes  Other Topics Concern   Not on file  Social History Narrative   Not on file   Social Determinants of Health   Financial Resource Strain: Not on file  Food Insecurity: Not on file  Transportation Needs: Not on file  Physical Activity: Not on file  Stress: Not on file  Social Connections: Not on file  Intimate Partner Violence: Not on file    Review of Systems  Constitutional:  Negative for fatigue and unexpected weight change.  Respiratory:  Negative for chest tightness and shortness of breath.   Cardiovascular:  Negative for chest pain, palpitations and leg swelling.  Gastrointestinal:  Negative for abdominal pain and blood in stool.  Neurological:  Negative for dizziness, syncope, light-headedness and headaches (few days ago - now resolved.).     Objective:   Vitals:   09/24/22 1515  BP: 122/78  Pulse: 94  Temp: 99.1 F (37.3 C)  SpO2: 94%  Weight: 286 lb 6.4 oz (129.9 kg)  Height: '5\' 2"'$  (1.575 m)     Physical Exam Vitals reviewed.  Constitutional:      Appearance: Normal appearance. She is well-developed.  HENT:     Head: Normocephalic and atraumatic.  Eyes:     Conjunctiva/sclera: Conjunctivae normal.     Pupils: Pupils are equal, round, and reactive to light.  Neck:     Vascular: No carotid bruit.  Cardiovascular:     Rate and Rhythm: Normal rate and regular rhythm.     Heart sounds: Normal heart sounds.  Pulmonary:     Effort: Pulmonary effort is normal.     Breath sounds: Normal breath sounds.  Abdominal:     Palpations: Abdomen is soft. There is no pulsatile mass.     Tenderness: There is no abdominal tenderness.  Musculoskeletal:     Right lower leg: No edema.     Left lower leg: No  edema.     Comments: Pain free ROM of c-spine.  Equal upper extremity strength, intact, grip strength intact.  Sensation intact to all fingertips with normal range of motion.  Cap refill less than 1 second.  Skin:    General: Skin is warm and dry.  Neurological:     General: No focal deficit present.     Mental Status: She is alert and oriented to person, place, and time.  Psychiatric:        Mood and Affect: Mood normal.        Behavior: Behavior normal.      Assessment & Plan:  Deborah Harman  D Marks is a 51 y.o. female . Diabetes mellitus without complication ([place]) - Plan: Hemoglobin Q2V, Basic metabolic panel  -Z5G at diabetes level last visit.  Check A1c.  Consider prescription med including GLP-1 which could also help with weight.  Labs pending.  Essential hypertension - Plan: amLODipine (NORVASC) 5 MG tablet  -Initial blood pressure looked okay, repeat elevated, consistent with some of her elevated readings at home.  Option of 7.5 mg total dose of amlodipine if persistent Home reading elevation and will advise me if that change is made so I can adjust prescription/refills.  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  No neck pain, reassuring exam.  Recommended changing sleep position with RTC precautions if persistent.  Meds ordered this encounter  Medications   amLODipine (NORVASC) 5 MG tablet    Sig: Take 1 tablet (5 mg total) by mouth daily.    Dispense:  90 tablet    Refill:  1   Patient Instructions  Blood pressure looks good here today.  I will check the diabetes test and your electrolytes and let you know once we receive those results if medications are needed.  Unfortunately Ozempic was not covered previously, but can look at that or another medication option depending on lab results.  Try sleeping with arms out, not tucked in, and see if that helps with the tingling in the fingers.  If the symptoms continue, please  schedule visit and we can look at other possible causes.  Return to the clinic or go to the nearest emergency room if any of your symptoms worsen or new symptoms occur.     Signed,   [name], MD [place], [facility] 09/24/22 3:59 PM

## 2022-09-25 ENCOUNTER — Other Ambulatory Visit (INDEPENDENT_AMBULATORY_CARE_PROVIDER_SITE_OTHER): Payer: 59

## 2022-09-25 DIAGNOSIS — E119 Type 2 diabetes mellitus without complications: Secondary | ICD-10-CM

## 2022-09-25 LAB — BASIC METABOLIC PANEL
BUN: 14 mg/dL (ref 6–23)
CO2: 32 mEq/L (ref 19–32)
Calcium: 9.3 mg/dL (ref 8.4–10.5)
Chloride: 102 mEq/L (ref 96–112)
Creatinine, Ser: 0.97 mg/dL (ref 0.40–1.20)
GFR: 67.31 mL/min (ref >60.00)
Glucose, Bld: 114 mg/dL — ABNORMAL HIGH (ref 70–99)
Potassium: 3.8 mEq/L (ref 3.5–5.1)
Sodium: 140 mEq/L (ref 135–145)

## 2022-09-25 LAB — HEMOGLOBIN A1C: Hgb A1c MFr Bld: 6 % (ref 4.6–6.5)

## 2022-11-02 ENCOUNTER — Other Ambulatory Visit: Payer: Self-pay

## 2022-11-05 ENCOUNTER — Other Ambulatory Visit (HOSPITAL_COMMUNITY): Payer: Self-pay

## 2022-12-07 ENCOUNTER — Other Ambulatory Visit (HOSPITAL_COMMUNITY): Payer: Self-pay

## 2022-12-07 MED ORDER — ATOVAQUONE-PROGUANIL HCL 250-100 MG PO TABS
1.0000 | ORAL_TABLET | Freq: Every day | ORAL | 0 refills | Status: DC
  Filled 2022-12-07: qty 41, 41d supply, fill #0

## 2022-12-07 MED ORDER — AZITHROMYCIN 500 MG PO TABS
ORAL_TABLET | ORAL | 0 refills | Status: DC
  Filled 2022-12-07: qty 4, 3d supply, fill #0

## 2022-12-10 ENCOUNTER — Encounter (HOSPITAL_BASED_OUTPATIENT_CLINIC_OR_DEPARTMENT_OTHER): Payer: Self-pay | Admitting: Cardiology

## 2023-01-21 ENCOUNTER — Other Ambulatory Visit (HOSPITAL_COMMUNITY): Payer: Self-pay

## 2023-01-21 ENCOUNTER — Ambulatory Visit: Payer: Commercial Managed Care - PPO | Admitting: Family Medicine

## 2023-01-21 VITALS — BP 128/78 | HR 87 | Temp 97.8°F | Ht 62.0 in | Wt 287.6 lb

## 2023-01-21 DIAGNOSIS — E119 Type 2 diabetes mellitus without complications: Secondary | ICD-10-CM | POA: Diagnosis not present

## 2023-01-21 DIAGNOSIS — I10 Essential (primary) hypertension: Secondary | ICD-10-CM | POA: Diagnosis not present

## 2023-01-21 DIAGNOSIS — Z6841 Body Mass Index (BMI) 40.0 and over, adult: Secondary | ICD-10-CM | POA: Diagnosis not present

## 2023-01-21 MED ORDER — AMLODIPINE BESYLATE 5 MG PO TABS
5.0000 mg | ORAL_TABLET | Freq: Every day | ORAL | 1 refills | Status: DC
  Filled 2023-01-21: qty 90, 90d supply, fill #0
  Filled 2023-05-07: qty 90, 90d supply, fill #1

## 2023-01-21 NOTE — Progress Notes (Unsigned)
Subjective:  Patient ID: [name], female    DOB: 10-06-1971  Age: 52 y.o. MRN: [id number]  CC:  Chief Complaint  Patient presents with   Hypertension   Prediabetes    HPI [name] presents for   Father passed in October, doing ok now. Traveled to [place] for [employer] in January - spent month in [place].    Hypertension: Treated with amlodipine 5 mg - 7.'5mg'$  daily few days per week at higher dose.  Home readings: BP Readings from Last 3 Encounters:  01/21/23 128/78  09/24/22 (!) 140/90  09/10/22 122/84   Lab Results  Component Value Date   CREATININE 0.97 09/25/2022   Prediabetes/diabetes Diet/exercise approach.  Weight up 1 pound from her last visit.  Previous prediabetes but A1c at diabetic level of 6.8 on July 31.  No current meds.  No soda/sweet tea.  Walking for exercise 3 days per week - 30 minutes.  Declines surgical eval.  Seen by cardiology previously, trial of Ozempic previously but unable to continue due to insurance coverage. Lab Results  Component Value Date   HGBA1C 6.0 09/25/2022   Wt Readings from Last 3 Encounters:  01/21/23 287 lb 9.6 oz (130.5 kg)  09/24/22 286 lb 6.4 oz (129.9 kg)  09/10/22 285 lb (129.3 kg)  Body mass index is 52.6 kg/m.   History Patient Active Problem List   Diagnosis Date Noted   Diabetes mellitus without complication ([place]) [id number]   Prediabetes 09/24/2022   History of anemia 09/24/2022   PONV (postoperative nausea and vomiting) 07/04/2022   Essential hypertension 07/04/2022   Anemia 07/04/2022   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   Class 3 severe obesity without serious comorbidity with body mass index (BMI) of 50.0 to 59.9 in adult [facility]) 02/15/2020    Past Medical History:  Diagnosis Date   Anemia    Difficult airway for intubation    Pt.denies updated 06/12/22   Headache(784.0)    Hypertension    PONV (postoperative nausea and vomiting)    Review of Systems  Constitutional:  Negative  for fatigue and unexpected weight change.  Respiratory:  Negative for chest tightness and shortness of breath.   Cardiovascular:  Negative for chest pain, palpitations and leg swelling.  Gastrointestinal:  Negative for abdominal pain and blood in stool.  Neurological:  Negative for dizziness, syncope, light-headedness and headaches.     Objective:   Vitals:   01/21/23 1525  BP: 128/78  Pulse: 87  Temp: 97.8 F (36.6 C)  TempSrc: Temporal  SpO2: 99%  Weight: 287 lb 9.6 oz (130.5 kg)  Height: '5\' 2"'$  (1.575 m)     Physical Exam Vitals reviewed.  Constitutional:      Appearance: Normal appearance. She is well-developed. She is obese. She is not ill-appearing.  HENT:     Head: Normocephalic and atraumatic.  Eyes:     Conjunctiva/sclera: Conjunctivae normal.     Pupils: Pupils are equal, round, and reactive to light.  Neck:     Vascular: No carotid bruit.  Cardiovascular:     Rate and Rhythm: Normal rate and regular rhythm.     Heart sounds: Normal heart sounds.  Pulmonary:     Effort: Pulmonary effort is normal.     Breath sounds: Normal breath sounds.  Abdominal:     Palpations: Abdomen is soft. There is no pulsatile mass.     Tenderness: There is no abdominal tenderness.  Musculoskeletal:     Right lower leg:  No edema.     Left lower leg: No edema.  Skin:    General: Skin is warm and dry.  Neurological:     Mental Status: She is alert and oriented to person, place, and time.  Psychiatric:        Mood and Affect: Mood normal.        Behavior: Behavior normal.     Assessment & Plan:  [name] is a 52 y.o. female . Diabetes mellitus without complication ([place]) Assessment & Plan: Borderline prediabetes/diabetes but diabetic level of A1c last year.  Diet/exercise approach discussed.  Some difficulty with weight loss.  Ozempic not covered previously, question if that would be covered with diabetes diagnosis, but she would like to meet with weight management  specialist.  Referral to healthy weight and wellness.  Orders: -     Comprehensive metabolic panel -     Lipid panel -     Microalbumin / creatinine urine ratio -     Hemoglobin A1c  Essential hypertension Assessment & Plan:  Stable, tolerating current regimen. Medications refilled. Labs pending   Orders: -     amLODIPine Besylate; Take 1 tablet (5 mg total) by mouth daily.  Dispense: 90 tablet; Refill: 1  Class 3 severe obesity without serious comorbidity with body mass index (BMI) of 50.0 to 59.9 in adult, unspecified obesity type [facility]) Assessment & Plan: Previously Ozempic not covered.  Watch diet, low intensity activity, refer to weight management specialist.  Orders: -     Amb Ref to Medical Weight Management    Patient Instructions  Keep up the good work with walking - goal of most days per week and 150 minutes total per week.  I will refer you to weight management to look at options.  If blood pressure is over 130/80 - take the 7.'5mg'$  dose of amlodipine daily. Let me know if you need higher dose of meds and I can send in the 2.'5mg'$  pill to make it easier to add to the '5mg'$ .         Signed,   [name], MD [place] 01/22/23 10:12 AM

## 2023-01-21 NOTE — Patient Instructions (Addendum)
Keep up the good work with walking - goal of most days per week and 150 minutes total per week.  I will refer you to weight management to look at options.  If blood pressure is over 130/80 - take the 7.'5mg'$  dose of amlodipine daily. Let me know if you need higher dose of meds and I can send in the 2.'5mg'$  pill to make it easier to add to the '5mg'$ .

## 2023-01-22 ENCOUNTER — Encounter: Payer: Self-pay | Admitting: Family Medicine

## 2023-01-22 DIAGNOSIS — E119 Type 2 diabetes mellitus without complications: Secondary | ICD-10-CM | POA: Insufficient documentation

## 2023-01-22 LAB — LIPID PANEL
Cholesterol: 162 mg/dL (ref 0–200)
HDL: 41.9 mg/dL (ref >39.00)
LDL Cholesterol: 90 mg/dL (ref 0–99)
NonHDL: 120.07
Total CHOL/HDL Ratio: 4
Triglycerides: 152 mg/dL — ABNORMAL HIGH (ref 0.0–149.0)
VLDL: 30.4 mg/dL (ref 0.0–40.0)

## 2023-01-22 LAB — MICROALBUMIN / CREATININE URINE RATIO
Creatinine,U: 128.4 mg/dL
Microalb Creat Ratio: 0.7 mg/g (ref 0.0–30.0)
Microalb, Ur: 0.9 mg/dL (ref 0.0–1.9)

## 2023-01-22 LAB — COMPREHENSIVE METABOLIC PANEL
ALT: 24 U/L (ref 0–35)
AST: 20 U/L (ref 0–37)
Albumin: 4.2 g/dL (ref 3.5–5.2)
Alkaline Phosphatase: 78 U/L (ref 39–117)
BUN: 19 mg/dL (ref 6–23)
CO2: 30 mEq/L (ref 19–32)
Calcium: 9.4 mg/dL (ref 8.4–10.5)
Chloride: 102 mEq/L (ref 96–112)
Creatinine, Ser: 0.8 mg/dL (ref 0.40–1.20)
GFR: 84.62 mL/min (ref >60.00)
Glucose, Bld: 87 mg/dL (ref 70–99)
Potassium: 4.5 mEq/L (ref 3.5–5.1)
Sodium: 139 mEq/L (ref 135–145)
Total Bilirubin: 0.3 mg/dL (ref 0.2–1.2)
Total Protein: 7.4 g/dL (ref 6.0–8.3)

## 2023-01-22 LAB — HEMOGLOBIN A1C: Hgb A1c MFr Bld: 5.9 % (ref 4.6–6.5)

## 2023-01-22 NOTE — Assessment & Plan Note (Signed)
Borderline prediabetes/diabetes but diabetic level of A1c last year.  Diet/exercise approach discussed.  Some difficulty with weight loss.  Ozempic not covered previously, question if that would be covered with diabetes diagnosis, but she would like to meet with weight management specialist.  Referral to healthy weight and wellness.

## 2023-01-22 NOTE — Assessment & Plan Note (Signed)
Previously Ozempic not covered.  Watch diet, low intensity activity, refer to weight management specialist.

## 2023-01-22 NOTE — Assessment & Plan Note (Signed)
Stable, tolerating current regimen. Medications refilled. Labs pending

## 2023-01-25 ENCOUNTER — Encounter (HOSPITAL_COMMUNITY): Payer: Self-pay

## 2023-01-25 ENCOUNTER — Other Ambulatory Visit (HOSPITAL_COMMUNITY): Payer: Self-pay

## 2023-01-25 ENCOUNTER — Ambulatory Visit (HOSPITAL_COMMUNITY)
Admission: RE | Admit: 2023-01-25 | Discharge: 2023-01-25 | Disposition: A | Discharge status: Home / Self Care | Payer: Commercial Managed Care - PPO | Source: Ambulatory Visit | Attending: Emergency Medicine | Admitting: Emergency Medicine

## 2023-01-25 VITALS — BP 134/87 | HR 80 | Temp 98.4°F | Resp 16 | Ht 62.0 in | Wt 287.7 lb

## 2023-01-25 DIAGNOSIS — M25511 Pain in right shoulder: Secondary | ICD-10-CM | POA: Diagnosis not present

## 2023-01-25 MED ORDER — KETOROLAC TROMETHAMINE 30 MG/ML IJ SOLN
30.0000 mg | Freq: Once | INTRAMUSCULAR | Status: AC
  Administered 2023-01-25: 30 mg via INTRAMUSCULAR

## 2023-01-25 MED ORDER — CYCLOBENZAPRINE HCL 10 MG PO TABS
10.0000 mg | ORAL_TABLET | Freq: Two times a day (BID) | ORAL | 0 refills | Status: DC | PRN
  Filled 2023-01-25: qty 20, 10d supply, fill #0

## 2023-01-25 MED ORDER — KETOROLAC TROMETHAMINE 30 MG/ML IJ SOLN
INTRAMUSCULAR | Status: AC
  Filled 2023-01-25: qty 1

## 2023-01-25 NOTE — ED Triage Notes (Signed)
Chief Complaint: shoulder pain after sleeping on it wrong. Right shoulder pain with reduced range of motion, no numbness. No history of shoulder problems.   Onset: Monday   Prescriptions or OTC medications tried: Yes- tylenol     with little relief

## 2023-01-25 NOTE — Discharge Instructions (Signed)
The medication given today should start to reduce pain in about 30 minutes  I recommend to continue tylenol 650 mg eery 4-6 hours for pain The muscle relaxer can be taken twice daily. If this makes you drowsy, take only at night. Please follow up with the orthopedic specialists if symptoms persist

## 2023-01-25 NOTE — ED Provider Notes (Signed)
[facility]    CSN: [id number] Arrival date & time: 01/25/23  1023      History   Chief Complaint Chief Complaint  Patient presents with   Shoulder Pain   Appointment    HPI [name] is a 52 y.o. female.  Here with right shoulder pain Reports at the end of February she slept on a plane and had some shoulder pain. Went away with Tylenol.  Couple days ago she slept on it funny again.  Reports reduced range of motion at the shoulder and pain if she tries to lift.  She denies any numbness or tingling. No weakness. No recent injury or trauma No history of shoulder problems  She does work in [employer], lifting and moving  Past Medical History:  Diagnosis Date   Anemia    Difficult airway for intubation    Pt.denies updated 06/12/22   Headache(784.0)    Hypertension    PONV (postoperative nausea and vomiting)     Patient Active Problem List   Diagnosis Date Noted   Diabetes mellitus without complication ([place]) [id number]   Prediabetes 09/24/2022   History of anemia 09/24/2022   PONV (postoperative nausea and vomiting) 07/04/2022   Essential hypertension 07/04/2022   Anemia 07/04/2022   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   Class 3 severe obesity without serious comorbidity with body mass index (BMI) of 50.0 to 59.9 in adult ([place]) 02/15/2020    Past Surgical History:  Procedure Laterality Date   COLONOSCOPY WITH PROPOFOL N/A 06/19/2022   Procedure: COLONOSCOPY WITH PROPOFOL;  Surgeon: Daryel November, MD;  Location: [facility];  Service: Gastroenterology;  Laterality: N/A;   DILATION AND CURETTAGE OF UTERUS  2000   DILITATION & CURRETTAGE/HYSTROSCOPY WITH NOVASURE ABLATION N/A 02/05/2013   Procedure: DILATATION & CURETTAGE/HYSTEROSCOPY WITH NOVASURE ABLATION;  Surgeon: Margarette Asal, MD;  Location: [facility];  Service: Gynecology;  Laterality: N/A;   LAPAROSCOPIC TUBAL LIGATION Bilateral 02/05/2013   Procedure: LAPAROSCOPIC TUBAL  LIGATION;  Surgeon: Margarette Asal, MD;  Location: [facility];  Service: Gynecology;  Laterality: Bilateral;  attempted tubal ligation   POLYPECTOMY  06/19/2022   Procedure: POLYPECTOMY;  Surgeon: Daryel November, MD;  Location: [facility];  Service: Gastroenterology;;    OB History   No obstetric history on file.      Home Medications    Prior to Admission medications   Medication Sig Start Date End Date Taking? Authorizing Provider  amLODipine (NORVASC) 5 MG tablet Take 1 tablet (5 mg total) by mouth daily. 01/21/23  Yes [name], MD  Ascorbic Acid (VITAMIN C PO) Take 1 tablet by mouth in the morning.   Yes [provider]  Biotin 10000 MCG TABS Take by mouth daily.   Yes [provider]  Cholecalciferol (VITAMIN D3 PO) Take by mouth daily. 2000 iu   Yes [provider]  cyclobenzaprine (FLEXERIL) 10 MG tablet Take 1 tablet (10 mg total) by mouth 2 (two) times daily as needed. 01/25/23  Yes Riley Papin, Wells Guiles, PA-C  Multiple Vitamin (MULTIVITAMIN WITH MINERALS) TABS tablet Take 1 tablet by mouth in the morning. Centrum for Women 50+   Yes [provider]  Blood Pressure Monitoring (BLOOD PRESSURE MONITOR 3) DEVI Use as directed. 04/09/22       Family History Family History  Problem Relation Age of Onset   Cancer Sister    Breast cancer Sister    Colon cancer Neg Hx    Esophageal cancer Neg  Hx    Pancreatic cancer Neg Hx    Stomach cancer Neg Hx    Heart disease Neg Hx    Diabetes Neg Hx    Colon polyps Neg Hx    Crohn's disease Neg Hx    Rectal cancer Neg Hx     Social History Social History   Tobacco Use   Smoking status: Never    Passive exposure: Never   Smokeless tobacco: Never  Vaping Use   Vaping Use: Never used  Substance Use Topics   Alcohol use: Yes    Comment: occas   Drug use: No     Allergies   Lactose intolerance (gi)   Review of Systems Review of Systems As per HPI  Physical Exam Triage Vital  Signs ED Triage Vitals  Enc Vitals Group     BP 01/25/23 1042 134/87     Pulse Rate 01/25/23 1042 80     Resp 01/25/23 1042 16     Temp 01/25/23 1042 98.4 F (36.9 C)     Temp Source 01/25/23 1042 Oral     SpO2 01/25/23 1042 98 %     Weight 01/25/23 1041 287 lb 11.2 oz (130.5 kg)     Height 01/25/23 1041 '5\' 2"'$  (1.575 m)     Head Circumference --      Peak Flow --      Pain Score 01/25/23 1040 9     Pain Loc --      Pain Edu? --      Excl. in [place]? --    No data found.  Updated Vital Signs BP 134/87 (BP Location: Right Wrist)   Pulse 80   Temp 98.4 F (36.9 C) (Oral)   Resp 16   Ht '5\' 2"'$  (1.575 m)   Wt 287 lb 11.2 oz (130.5 kg)   LMP 09/08/2022 (Approximate)   SpO2 98%   BMI 52.62 kg/m    Physical Exam Vitals and nursing note reviewed.  Constitutional:      General: She is not in acute distress. HENT:     Mouth/Throat:     Pharynx: Oropharynx is clear.  Neck:     Comments: No C or T spine tenderness  Cardiovascular:     Rate and Rhythm: Normal rate and regular rhythm.     Pulses: Normal pulses.     Heart sounds: Normal heart sounds.  Pulmonary:     Effort: Pulmonary effort is normal.     Breath sounds: Normal breath sounds.  Musculoskeletal:     Right shoulder: No swelling, deformity or bony tenderness. Decreased range of motion. Normal strength. Normal pulse.     Left shoulder: Normal range of motion.     Cervical back: Normal range of motion. No tenderness.     Comments: Reduced range of motion of the shoulder.  She can lift to about 30 degrees in all fields before pain occurs.  Full range of motion at elbow and wrist.  Distal sensation intact, grip strength 5/5, strong radial pulse.  No tenderness over clavicle, AC joint, scapula. No bruising or skin changes  Skin:    General: Skin is warm and dry.     Capillary Refill: Capillary refill takes less than 2 seconds.  Neurological:     Mental Status: She is alert and oriented to person, place, and time.       UC Treatments / Results  Labs (all labs ordered are listed, but only abnormal results are displayed) Labs Reviewed -  No data to display  EKG   Radiology No results found.  Procedures Procedures (including critical care time)  Medications Ordered in UC Medications  ketorolac (TORADOL) 30 MG/ML injection 30 mg (30 mg Intramuscular Given 01/25/23 1115)    Initial Impression / Assessment and Plan / UC Course  I have reviewed the triage vital signs and the nursing notes.  Pertinent labs & imaging results that were available during my care of the patient were reviewed by me and considered in my medical decision making (see chart for details).  Defer imaging today given absence of trauma or bony tenderness. Consider soft tissue injury. Toradol IM given for pain. Muscle relaxer BID. Recommend tylenol at home and follow up with ortho if symptoms persist   Final Clinical Impressions(s) / UC Diagnoses   Final diagnoses:  Acute pain of right shoulder     Discharge Instructions      The medication given today should start to reduce pain in about 30 minutes  I recommend to continue tylenol 650 mg eery 4-6 hours for pain The muscle relaxer can be taken twice daily. If this makes you drowsy, take only at night. Please follow up with the orthopedic specialists if symptoms persist     ED Prescriptions     Medication Sig Dispense Auth. Provider   cyclobenzaprine (FLEXERIL) 10 MG tablet Take 1 tablet (10 mg total) by mouth 2 (two) times daily as needed. 20 tablet Clothilde Tippetts, Wells Guiles, PA-C      PDMP not reviewed this encounter.   [place], [place] 01/25/23 1135

## 2023-04-11 DIAGNOSIS — Z1231 Encounter for screening mammogram for malignant neoplasm of breast: Secondary | ICD-10-CM | POA: Diagnosis not present

## 2023-04-11 DIAGNOSIS — Z113 Encounter for screening for infections with a predominantly sexual mode of transmission: Secondary | ICD-10-CM | POA: Diagnosis not present

## 2023-04-11 DIAGNOSIS — Z6841 Body Mass Index (BMI) 40.0 and over, adult: Secondary | ICD-10-CM | POA: Diagnosis not present

## 2023-04-11 DIAGNOSIS — Z01419 Encounter for gynecological examination (general) (routine) without abnormal findings: Secondary | ICD-10-CM | POA: Diagnosis not present

## 2023-04-11 DIAGNOSIS — D649 Anemia, unspecified: Secondary | ICD-10-CM | POA: Diagnosis not present

## 2023-04-11 LAB — HM MAMMOGRAPHY

## 2023-05-07 ENCOUNTER — Other Ambulatory Visit (HOSPITAL_COMMUNITY): Payer: Self-pay

## 2023-05-08 DIAGNOSIS — R899 Unspecified abnormal finding in specimens from other organs, systems and tissues: Secondary | ICD-10-CM | POA: Diagnosis not present

## 2023-05-28 ENCOUNTER — Other Ambulatory Visit: Payer: Self-pay | Admitting: Family Medicine

## 2023-05-28 ENCOUNTER — Ambulatory Visit: Payer: Commercial Managed Care - PPO | Admitting: Family Medicine

## 2023-05-28 ENCOUNTER — Other Ambulatory Visit (HOSPITAL_COMMUNITY): Payer: Self-pay

## 2023-05-28 ENCOUNTER — Encounter: Payer: Self-pay | Admitting: Family Medicine

## 2023-05-28 VITALS — BP 138/90 | HR 92 | Temp 98.3°F | Wt 285.0 lb

## 2023-05-28 DIAGNOSIS — I10 Essential (primary) hypertension: Secondary | ICD-10-CM | POA: Diagnosis not present

## 2023-05-28 DIAGNOSIS — Z0289 Encounter for other administrative examinations: Secondary | ICD-10-CM

## 2023-05-28 MED ORDER — AMLODIPINE BESYLATE 5 MG PO TABS: 10.0000 mg | ORAL_TABLET | Freq: Every day | ORAL | 1 refills | Status: DC

## 2023-05-28 MED ORDER — METOPROLOL SUCCINATE ER 25 MG PO TB24
25.0000 mg | ORAL_TABLET | Freq: Every day | ORAL | 0 refills | Status: DC
  Filled 2023-05-28: qty 30, 30d supply, fill #0

## 2023-05-28 NOTE — Progress Notes (Signed)
   Subjective:    Patient ID: [name], female    DOB: [birth year], 52 y.o.   MRN: [id number]  HPI Here for elevated BP. She was started on Amlodipine 5 mg daily by her PCP, Dr. Neva Seat, about a year ago. Her BP did well for awhile. It was 128/78 when she saw Dr. Neva Seat on 01-21-23. Then on 04-14-23 at her GYN office it was 155/88. This morning she woke up with a mild headache, and the BP was 200/92. She denies any chest pain or SOB or dizziness. She does not use tobacco.    Review of Systems  Constitutional: Negative.   Respiratory: Negative.    Cardiovascular: Negative.   Neurological:  Positive for headaches.       Objective:   Physical Exam Constitutional:      Appearance: She is obese. She is not ill-appearing.  Cardiovascular:     Rate and Rhythm: Regular rhythm. Tachycardia present.     Pulses: Normal pulses.     Heart sounds: Normal heart sounds.  Pulmonary:     Effort: Pulmonary effort is normal.     Breath sounds: Normal breath sounds.  Musculoskeletal:     Right lower leg: No edema.     Left lower leg: No edema.  Neurological:     General: No focal deficit present.     Mental Status: She is alert and oriented to person, place, and time.           Assessment & Plan:  Her HTN is not well controlled. We will increase the Amlodipine to 10 mg daily, and we will add Metoprolol succinate 25 mg daily. She will follow up with Dr. Neva Seat in 2 weeks.  Gershon Crane, MD

## 2023-05-29 ENCOUNTER — Encounter (INDEPENDENT_AMBULATORY_CARE_PROVIDER_SITE_OTHER): Payer: Commercial Managed Care - PPO | Admitting: Family Medicine

## 2023-05-29 ENCOUNTER — Other Ambulatory Visit (HOSPITAL_COMMUNITY): Payer: Self-pay

## 2023-06-12 ENCOUNTER — Ambulatory Visit: Payer: Commercial Managed Care - PPO | Admitting: Family Medicine

## 2023-06-12 ENCOUNTER — Other Ambulatory Visit (HOSPITAL_COMMUNITY): Payer: Self-pay

## 2023-06-12 VITALS — BP 138/86 | HR 86 | Temp 98.5°F | Ht 62.0 in | Wt 287.2 lb

## 2023-06-12 DIAGNOSIS — I10 Essential (primary) hypertension: Secondary | ICD-10-CM

## 2023-06-12 DIAGNOSIS — R350 Frequency of micturition: Secondary | ICD-10-CM | POA: Diagnosis not present

## 2023-06-12 DIAGNOSIS — R208 Other disturbances of skin sensation: Secondary | ICD-10-CM

## 2023-06-12 DIAGNOSIS — R7303 Prediabetes: Secondary | ICD-10-CM | POA: Diagnosis not present

## 2023-06-12 DIAGNOSIS — R718 Other abnormality of red blood cells: Secondary | ICD-10-CM

## 2023-06-12 MED ORDER — METOPROLOL SUCCINATE ER 50 MG PO TB24
50.0000 mg | ORAL_TABLET | Freq: Every day | ORAL | 1 refills | Status: DC
  Filled 2023-06-12: qty 90, 90d supply, fill #0

## 2023-06-12 MED ORDER — AMLODIPINE BESYLATE 10 MG PO TABS
10.0000 mg | ORAL_TABLET | Freq: Every day | ORAL | 1 refills | Status: DC
  Filled 2023-06-12: qty 90, 90d supply, fill #0
  Filled 2023-09-10: qty 90, 90d supply, fill #1

## 2023-06-12 NOTE — Progress Notes (Unsigned)
Subjective:  Patient ID: [name], female    DOB: 1971-03-14  Age: 52 y.o. MRN: [id number]  CC:  Chief Complaint  Patient presents with   Hypertension    Pt had a few high readings and had a headache andjnust wants to see what she can do to improve this was started on the metoprolol     HPI ONESTY CLAIR presents for   Hypertension: Amlodipine 5 mg daily, Toprol-XL 25 mg daily. Recent visit July 9 with Dr. Clent Ridges, elevated blood pressure, Mild headache and blood pressure elevation of 200/92 on July 9th.  Borderline blood pressure at that time.  Amlodipine was increased to 10 mg at that time and Toprol added. Tolerating new med and doses. No missed doses. Some frequent urination.  Home readings: 138/80.  Has cut back on salt addition.  BP Readings from Last 3 Encounters:  06/12/23 138/86  05/28/23 (!) 138/90  01/25/23 134/87   Lab Results  Component Value Date   CREATININE 0.80 01/21/2023   Prediabetes: No current meds.  Diet/exercise approach.  Cardiology had tried Ozempic previously but unable to continue due to insurance coverage. Frequent urination past few weeks - since starting metoprolol. No home blood sugar readings.  No change in thirst, vision change, n/v/abd pain.  Some burning on bottom of feet. Episodic as in past, more frequent recently. No hand or mouth symptoms.  Told had low RBC on labs at GYN recently in May.  No results found for: "VITAMINB12" Lab Results  Component Value Date   WBC 4.5 01/21/2019   HGB 12.9 01/21/2019   HCT 38.8 01/21/2019   MCV 75 (L) 01/21/2019   PLT 272 01/21/2019   Diabetic Foot Exam - Simple   No data filed       Lab Results  Component Value Date   HGBA1C 5.9 01/21/2023   Wt Readings from Last 3 Encounters:  06/12/23 287 lb 3.2 oz (130.3 kg)  05/28/23 285 lb (129.3 kg)  01/25/23 287 lb 11.2 oz (130.5 kg)      History Patient Active Problem List   Diagnosis Date Noted   Diabetes mellitus without complication  (HCC) 01/22/2023   Prediabetes 09/24/2022   History of anemia 09/24/2022   PONV (postoperative nausea and vomiting) 07/04/2022   Essential hypertension 07/04/2022   Anemia 07/04/2022   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   Class 3 severe obesity without serious comorbidity with body mass index (BMI) of 50.0 to 59.9 in adult [facility]) 02/15/2020   Past Medical History:  Diagnosis Date   Anemia    Difficult airway for intubation    Pt.denies updated 06/12/22   Headache(784.0)    Hypertension    PONV (postoperative nausea and vomiting)    Past Surgical History:  Procedure Laterality Date   COLONOSCOPY WITH PROPOFOL N/A 06/19/2022   Procedure: COLONOSCOPY WITH PROPOFOL;  Surgeon: Jenel Lucks, MD;  Location: [facility];  Service: Gastroenterology;  Laterality: N/A;   DILATION AND CURETTAGE OF UTERUS  2000   DILITATION & CURRETTAGE/HYSTROSCOPY WITH NOVASURE ABLATION N/A 02/05/2013   Procedure: DILATATION & CURETTAGE/HYSTEROSCOPY WITH NOVASURE ABLATION;  Surgeon: Meriel Pica, MD;  Location: [facility];  Service: Gynecology;  Laterality: N/A;   LAPAROSCOPIC TUBAL LIGATION Bilateral 02/05/2013   Procedure: LAPAROSCOPIC TUBAL LIGATION;  Surgeon: Meriel Pica, MD;  Location: [facility];  Service: Gynecology;  Laterality: Bilateral;  attempted tubal ligation   POLYPECTOMY  06/19/2022   Procedure: POLYPECTOMY;  Surgeon: Jenel Lucks, MD;  Location: [facility];  Service: Gastroenterology;;   Allergies  Allergen Reactions   Lactose Intolerance (Gi) Other (See Comments)    Cheese/milk products - GI upset   Prior to Admission medications   Medication Sig Start Date End Date Taking? Authorizing Provider  amLODipine (NORVASC) 5 MG tablet Take 2 tablets (10 mg total) by mouth daily. 05/28/23   Nelwyn Salisbury, MD  Ascorbic Acid (VITAMIN C PO) Take 1 tablet by mouth in the morning.    [provider]  Biotin 33295 MCG TABS Take by mouth daily.    [provider]   Blood Pressure Monitoring (BLOOD PRESSURE MONITOR 3) DEVI Use as directed. 04/09/22     Cholecalciferol (VITAMIN D3 PO) Take by mouth daily. 2000 iu    [provider]  metoprolol succinate (TOPROL-XL) 25 MG 24 hr tablet Take 1 tablet (25 mg total) by mouth daily. 05/28/23   Nelwyn Salisbury, MD  Multiple Vitamin (MULTIVITAMIN WITH MINERALS) TABS tablet Take 1 tablet by mouth in the morning. Centrum for Women 50+    [provider]   Social History   Socioeconomic History   Marital status: Married    Spouse name: Not on file   Number of children: Not on file   Years of education: Not on file   Highest education level: Not on file  Occupational History   Not on file  Tobacco Use   Smoking status: Never    Passive exposure: Never   Smokeless tobacco: Never  Vaping Use   Vaping status: Never Used  Substance and Sexual Activity   Alcohol use: Yes    Comment: occas   Drug use: No   Sexual activity: Yes  Other Topics Concern   Not on file  Social History Narrative   Not on file   Social Determinants of Health   Financial Resource Strain: Not on file  Food Insecurity: Not on file  Transportation Needs: Not on file  Physical Activity: Not on file  Stress: Not on file  Social Connections: Not on file  Intimate Partner Violence: Not on file    Review of Systems  Constitutional:  Negative for fatigue and unexpected weight change.  Respiratory:  Negative for chest tightness and shortness of breath.   Cardiovascular:  Negative for chest pain, palpitations and leg swelling.  Gastrointestinal:  Negative for abdominal pain and blood in stool.  Genitourinary:  Positive for frequency.  Neurological:  Negative for dizziness, syncope, light-headedness and headaches.     Objective:   Vitals:   06/12/23 1511  BP: 138/86  Pulse: 86  Temp: 98.5 F (36.9 C)  TempSrc: Temporal  SpO2: 97%  Weight: 287 lb 3.2 oz (130.3 kg)  Height: 5\' 2"  (1.575 m)    Physical  Exam Vitals reviewed.  Constitutional:      Appearance: Normal appearance. She is well-developed.  HENT:     Head: Normocephalic and atraumatic.  Eyes:     Conjunctiva/sclera: Conjunctivae normal.     Pupils: Pupils are equal, round, and reactive to light.  Neck:     Vascular: No carotid bruit.  Cardiovascular:     Rate and Rhythm: Normal rate and regular rhythm.     Heart sounds: Normal heart sounds.  Pulmonary:     Effort: Pulmonary effort is normal.     Breath sounds: Normal breath sounds.  Abdominal:     Palpations: Abdomen is soft. There is no pulsatile mass.     Tenderness: There is no  abdominal tenderness.  Musculoskeletal:     Right lower leg: No edema.     Left lower leg: No edema.  Skin:    General: Skin is warm and dry.  Neurological:     Mental Status: She is alert and oriented to person, place, and time.  Psychiatric:        Mood and Affect: Mood normal.        Behavior: Behavior normal.       Results for orders placed or performed in visit on 06/12/23  Basic metabolic panel  Result Value Ref Range   Sodium 138 135 - 145 mEq/L   Potassium 3.9 3.5 - 5.1 mEq/L   Chloride 99 96 - 112 mEq/L   CO2 32 19 - 32 mEq/L   Glucose, Bld 102 (H) 70 - 99 mg/dL   BUN 14 6 - 23 mg/dL   Creatinine, Ser 4.40 0.40 - 1.20 mg/dL   GFR 10.27 >25.36 mL/min   Calcium 9.6 8.4 - 10.5 mg/dL  Hemoglobin U4Q  Result Value Ref Range   Hgb A1c MFr Bld 6.0 4.6 - 6.5 %  CBC with Differential/Platelet  Result Value Ref Range   WBC 7.0 4.0 - 10.5 K/uL   RBC 5.28 (H) 3.87 - 5.11 Mil/uL   Hemoglobin 12.9 12.0 - 15.0 g/dL   HCT 03.4 74.2 - 59.5 %   MCV 75.0 (L) 78.0 - 100.0 fl   MCHC 32.6 30.0 - 36.0 g/dL   RDW 63.8 75.6 - 43.3 %   Platelets 289.0 150.0 - 400.0 K/uL   Neutrophils Relative % 53.3 43.0 - 77.0 %   Lymphocytes Relative 34.1 12.0 - 46.0 %   Monocytes Relative 9.9 3.0 - 12.0 %   Eosinophils Relative 1.5 0.0 - 5.0 %   Basophils Relative 1.2 0.0 - 3.0 %   Neutro Abs  3.7 1.4 - 7.7 K/uL   Lymphs Abs 2.4 0.7 - 4.0 K/uL   Monocytes Absolute 0.7 0.1 - 1.0 K/uL   Eosinophils Absolute 0.1 0.0 - 0.7 K/uL   Basophils Absolute 0.1 0.0 - 0.1 K/uL  B12  Result Value Ref Range   Vitamin B-12 925 (H) 211 - 911 pg/mL  TSH  Result Value Ref Range   TSH 3.62 0.35 - 5.50 uIU/mL    Assessment & Plan:  ANALISSE RANDLE is a 52 y.o. female . Prediabetes - Plan: Basic metabolic panel, Hemoglobin A1c, POCT urinalysis dipstick  -Check A1c.  No new meds at this time, follow-up in September as planned.  Frequent urination - Plan: POCT urinalysis dipstick  -Initial plan for urinalysis, not obtained.  Check labs as above, only borderline glucose, unlikely cause of frequent urination.  Will have staff call patient and if persistent urinary symptoms return for lab only visit for urinalysis.  Essential hypertension - Plan: Basic metabolic panel, B12, TSH, amLODipine (NORVASC) 10 MG tablet, metoprolol succinate (TOPROL-XL) 50 MG 24 hr tablet  -Borderline control, still slightly elevated.  Trial of 50 mg Toprol, continue same dose amlodipine.  New dose Toprol sent if tolerated.  Follow-up in September as planned.  RTC precautions sooner if needed.  Burning sensation of feet - Plan: B12, TSH  -B12, TSH overall reassuring, unlikely hyperglycemia cause.  Blood pressure adjustment as above, RTC if symptoms persist to look into other causes.  RBC abnormality - Plan: CBC with Differential/Platelet  -As above, reported RBC abnormality with other provider.  Borderline elevated RBC but normal hemoglobin.  Will continue to monitor with close  follow-up in the next 2 months.  No orders of the defined types were placed in this encounter.  Patient Instructions  Blood pressure is still running a little bit too high.  Okay to continue amlodipine 10 mg/day, try increasing the metoprolol to 50 mg/day.  I did send that new dose to your pharmacy, but if you would like to combine 2 of the 25 mg for  now to see if that is tolerated, that is fine.  No other new medications for today.  I will check your blood counts, blood sugar, electrolytes, thyroid test and B12 level.  I am also checking a urine test but if you have any burning with urination or persistent frequent urination, please follow-up to discuss that further.  That is less likely a side effect with metoprolol.  Keep follow-up with me as scheduled in September and we can recheck blood pressure at that time, follow-up sooner if any new or worsening symptoms.  Managing Your Hypertension Hypertension, also called high blood pressure, is when the force of the blood pressing against the walls of the arteries is too strong. Arteries are blood vessels that carry blood from your heart throughout your body. Hypertension forces the heart to work harder to pump blood and may cause the arteries to become narrow or stiff. Understanding blood pressure readings A blood pressure reading includes a higher number over a lower number: The first, or top, number is called the systolic pressure. It is a measure of the pressure in your arteries as your heart beats. The second, or bottom number, is called the diastolic pressure. It is a measure of the pressure in your arteries as the heart relaxes. For most people, a normal blood pressure is below 120/80. Your personal target blood pressure may vary depending on your medical conditions, your age, and other factors. Blood pressure is classified into four stages. Based on your blood pressure reading, your health care provider may use the following stages to determine what type of treatment you need, if any. Systolic pressure and diastolic pressure are measured in a unit called millimeters of mercury (mmHg). Normal Systolic pressure: below 120. Diastolic pressure: below 80. Elevated Systolic pressure: 120-129. Diastolic pressure: below 80. Hypertension stage 1 Systolic pressure: 130-139. Diastolic pressure:  80-89. Hypertension stage 2 Systolic pressure: 140 or above. Diastolic pressure: 90 or above. How can this condition affect me? Managing your hypertension is very important. Over time, hypertension can damage the arteries and decrease blood flow to parts of the body, including the brain, heart, and kidneys. Having untreated or uncontrolled hypertension can lead to: A heart attack. A stroke. A weakened blood vessel (aneurysm). Heart failure. Kidney damage. Eye damage. Memory and concentration problems. Vascular dementia. What actions can I take to manage this condition? Hypertension can be managed by making lifestyle changes and possibly by taking medicines. Your health care provider will help you make a plan to bring your blood pressure within a normal range. You may be referred for counseling on a healthy diet and physical activity. Nutrition  Eat a diet that is high in fiber and potassium, and low in salt (sodium), added sugar, and fat. An example eating plan is called the DASH diet. DASH stands for Dietary Approaches to Stop Hypertension. To eat this way: Eat plenty of fresh fruits and vegetables. Try to fill one-half of your plate at each meal with fruits and vegetables. Eat whole grains, such as whole-wheat pasta, brown rice, or whole-grain bread. Fill about one-fourth of your  plate with whole grains. Eat low-fat dairy products. Avoid fatty cuts of meat, processed or cured meats, and poultry with skin. Fill about one-fourth of your plate with lean proteins such as fish, chicken without skin, beans, eggs, and tofu. Avoid pre-made and processed foods. These tend to be higher in sodium, added sugar, and fat. Reduce your daily sodium intake. Many people with hypertension should eat less than 1,500 mg of sodium a day. Lifestyle  Work with your health care provider to maintain a healthy body weight or to lose weight. Ask what an ideal weight is for you. Get at least 30 minutes of exercise  that causes your heart to beat faster (aerobic exercise) most days of the week. Activities may include walking, swimming, or biking. Include exercise to strengthen your muscles (resistance exercise), such as weight lifting, as part of your weekly exercise routine. Try to do these types of exercises for 30 minutes at least 3 days a week. Do not use any products that contain nicotine or tobacco. These products include cigarettes, chewing tobacco, and vaping devices, such as e-cigarettes. If you need help quitting, ask your health care provider. Control any long-term (chronic) conditions you have, such as high cholesterol or diabetes. Identify your sources of stress and find ways to manage stress. This may include meditation, deep breathing, or making time for fun activities. Alcohol use Do not drink alcohol if: Your health care provider tells you not to drink. You are pregnant, may be pregnant, or are planning to become pregnant. If you drink alcohol: Limit how much you have to: 0-1 drink a day for women. 0-2 drinks a day for men. Know how much alcohol is in your drink. In the [place] one drink equals one 12 oz bottle of beer (355 mL), one 5 oz glass of wine (148 mL), or one 1 oz glass of hard liquor (44 mL). Medicines Your health care provider may prescribe medicine if lifestyle changes are not enough to get your blood pressure under control and if: Your systolic blood pressure is 130 or higher. Your diastolic blood pressure is 80 or higher. Take medicines only as told by your health care provider. Follow the directions carefully. Blood pressure medicines must be taken as told by your health care provider. The medicine does not work as well when you skip doses. Skipping doses also puts you at risk for problems. Monitoring Before you monitor your blood pressure: Do not smoke, drink caffeinated beverages, or exercise within 30 minutes before taking a measurement. Use the bathroom and empty your  bladder (urinate). Sit quietly for at least 5 minutes before taking measurements. Monitor your blood pressure at home as told by your health care provider. To do this: Sit with your back straight and supported. Place your feet flat on the floor. Do not cross your legs. Support your arm on a flat surface, such as a table. Make sure your upper arm is at heart level. Each time you measure, take two or three readings one minute apart and record the results. You may also need to have your blood pressure checked regularly by your health care provider. General information Talk with your health care provider about your diet, exercise habits, and other lifestyle factors that may be contributing to hypertension. Review all the medicines you take with your health care provider because there may be side effects or interactions. Keep all follow-up visits. Your health care provider can help you create and adjust your plan for managing your high blood  pressure. Where to find more information National Heart, Lung, and Blood Institute: PopSte[employer].is American Heart Association: www.heart.org Contact a health care provider if: You think you are having a reaction to medicines you have taken. You have repeated (recurrent) headaches. You feel dizzy. You have swelling in your ankles. You have trouble with your vision. Get help right away if: You develop a severe headache or confusion. You have unusual weakness or numbness, or you feel faint. You have severe pain in your chest or abdomen. You vomit repeatedly. You have trouble breathing. These symptoms may be an emergency. Get help right away. Call 911. Do not wait to see if the symptoms will go away. Do not drive yourself to the hospital. Summary Hypertension is when the force of blood pumping through your arteries is too strong. If this condition is not controlled, it may put you at risk for serious complications. Your personal target blood pressure  may vary depending on your medical conditions, your age, and other factors. For most people, a normal blood pressure is less than 120/80. Hypertension is managed by lifestyle changes, medicines, or both. Lifestyle changes to help manage hypertension include losing weight, eating a healthy, low-sodium diet, exercising more, stopping smoking, and limiting alcohol. This information is not intended to replace advice given to you by your health care provider. Make sure you discuss any questions you have with your health care provider. Document Revised: 07/20/2021 Document Reviewed: 07/20/2021 Elsevier Patient Education  2024 [employer].     Signed,   Meredith Staggers, MD [facility], [facility] 06/12/23 3:50 PM

## 2023-06-12 NOTE — Patient Instructions (Signed)
Blood pressure is still running a little bit too high.  Okay to continue amlodipine 10 mg/day, try increasing the metoprolol to 50 mg/day.  I did send that new dose to your pharmacy, but if you would like to combine 2 of the 25 mg for now to see if that is tolerated, that is fine.  No other new medications for today.  I will check your blood counts, blood sugar, electrolytes, thyroid test and B12 level.  I am also checking a urine test but if you have any burning with urination or persistent frequent urination, please follow-up to discuss that further.  That is less likely a side effect with metoprolol.  Keep follow-up with me as scheduled in September and we can recheck blood pressure at that time, follow-up sooner if any new or worsening symptoms.  Managing Your Hypertension Hypertension, also called high blood pressure, is when the force of the blood pressing against the walls of the arteries is too strong. Arteries are blood vessels that carry blood from your heart throughout your body. Hypertension forces the heart to work harder to pump blood and may cause the arteries to become narrow or stiff. Understanding blood pressure readings A blood pressure reading includes a higher number over a lower number: The first, or top, number is called the systolic pressure. It is a measure of the pressure in your arteries as your heart beats. The second, or bottom number, is called the diastolic pressure. It is a measure of the pressure in your arteries as the heart relaxes. For most people, a normal blood pressure is below 120/80. Your personal target blood pressure may vary depending on your medical conditions, your age, and other factors. Blood pressure is classified into four stages. Based on your blood pressure reading, your health care provider may use the following stages to determine what type of treatment you need, if any. Systolic pressure and diastolic pressure are measured in a unit called millimeters  of mercury (mmHg). Normal Systolic pressure: below 120. Diastolic pressure: below 80. Elevated Systolic pressure: 120-129. Diastolic pressure: below 80. Hypertension stage 1 Systolic pressure: 130-139. Diastolic pressure: 80-89. Hypertension stage 2 Systolic pressure: 140 or above. Diastolic pressure: 90 or above. How can this condition affect me? Managing your hypertension is very important. Over time, hypertension can damage the arteries and decrease blood flow to parts of the body, including the brain, heart, and kidneys. Having untreated or uncontrolled hypertension can lead to: A heart attack. A stroke. A weakened blood vessel (aneurysm). Heart failure. Kidney damage. Eye damage. Memory and concentration problems. Vascular dementia. What actions can I take to manage this condition? Hypertension can be managed by making lifestyle changes and possibly by taking medicines. Your health care provider will help you make a plan to bring your blood pressure within a normal range. You may be referred for counseling on a healthy diet and physical activity. Nutrition  Eat a diet that is high in fiber and potassium, and low in salt (sodium), added sugar, and fat. An example eating plan is called the DASH diet. DASH stands for Dietary Approaches to Stop Hypertension. To eat this way: Eat plenty of fresh fruits and vegetables. Try to fill one-half of your plate at each meal with fruits and vegetables. Eat whole grains, such as whole-wheat pasta, brown rice, or whole-grain bread. Fill about one-fourth of your plate with whole grains. Eat low-fat dairy products. Avoid fatty cuts of meat, processed or cured meats, and poultry with skin. Fill about  one-fourth of your plate with lean proteins such as fish, chicken without skin, beans, eggs, and tofu. Avoid pre-made and processed foods. These tend to be higher in sodium, added sugar, and fat. Reduce your daily sodium intake. Many people with  hypertension should eat less than 1,500 mg of sodium a day. Lifestyle  Work with your health care provider to maintain a healthy body weight or to lose weight. Ask what an ideal weight is for you. Get at least 30 minutes of exercise that causes your heart to beat faster (aerobic exercise) most days of the week. Activities may include walking, swimming, or biking. Include exercise to strengthen your muscles (resistance exercise), such as weight lifting, as part of your weekly exercise routine. Try to do these types of exercises for 30 minutes at least 3 days a week. Do not use any products that contain nicotine or tobacco. These products include cigarettes, chewing tobacco, and vaping devices, such as e-cigarettes. If you need help quitting, ask your health care provider. Control any long-term (chronic) conditions you have, such as high cholesterol or diabetes. Identify your sources of stress and find ways to manage stress. This may include meditation, deep breathing, or making time for fun activities. Alcohol use Do not drink alcohol if: Your health care provider tells you not to drink. You are pregnant, may be pregnant, or are planning to become pregnant. If you drink alcohol: Limit how much you have to: 0-1 drink a day for women. 0-2 drinks a day for men. Know how much alcohol is in your drink. In the [place] one drink equals one 12 oz bottle of beer (355 mL), one 5 oz glass of wine (148 mL), or one 1 oz glass of hard liquor (44 mL). Medicines Your health care provider may prescribe medicine if lifestyle changes are not enough to get your blood pressure under control and if: Your systolic blood pressure is 130 or higher. Your diastolic blood pressure is 80 or higher. Take medicines only as told by your health care provider. Follow the directions carefully. Blood pressure medicines must be taken as told by your health care provider. The medicine does not work as well when you skip doses.  Skipping doses also puts you at risk for problems. Monitoring Before you monitor your blood pressure: Do not smoke, drink caffeinated beverages, or exercise within 30 minutes before taking a measurement. Use the bathroom and empty your bladder (urinate). Sit quietly for at least 5 minutes before taking measurements. Monitor your blood pressure at home as told by your health care provider. To do this: Sit with your back straight and supported. Place your feet flat on the floor. Do not cross your legs. Support your arm on a flat surface, such as a table. Make sure your upper arm is at heart level. Each time you measure, take two or three readings one minute apart and record the results. You may also need to have your blood pressure checked regularly by your health care provider. General information Talk with your health care provider about your diet, exercise habits, and other lifestyle factors that may be contributing to hypertension. Review all the medicines you take with your health care provider because there may be side effects or interactions. Keep all follow-up visits. Your health care provider can help you create and adjust your plan for managing your high blood pressure. Where to find more information National Heart, Lung, and Blood Institute: [employer] American Heart Association: www.heart.org Contact a health care provider if:  You think you are having a reaction to medicines you have taken. You have repeated (recurrent) headaches. You feel dizzy. You have swelling in your ankles. You have trouble with your vision. Get help right away if: You develop a severe headache or confusion. You have unusual weakness or numbness, or you feel faint. You have severe pain in your chest or abdomen. You vomit repeatedly. You have trouble breathing. These symptoms may be an emergency. Get help right away. Call 911. Do not wait to see if the symptoms will go away. Do not drive  yourself to the hospital. Summary Hypertension is when the force of blood pumping through your arteries is too strong. If this condition is not controlled, it may put you at risk for serious complications. Your personal target blood pressure may vary depending on your medical conditions, your age, and other factors. For most people, a normal blood pressure is less than 120/80. Hypertension is managed by lifestyle changes, medicines, or both. Lifestyle changes to help manage hypertension include losing weight, eating a healthy, low-sodium diet, exercising more, stopping smoking, and limiting alcohol. This information is not intended to replace advice given to you by your health care provider. Make sure you discuss any questions you have with your health care provider. Document Revised: 07/20/2021 Document Reviewed: 07/20/2021 Elsevier Patient Education  2024 [employer].

## 2023-06-13 LAB — CBC WITH DIFFERENTIAL/PLATELET
Basophils Absolute: 0.1 10*3/uL (ref 0.0–0.1)
Basophils Relative: 1.2 % (ref 0.0–3.0)
Eosinophils Absolute: 0.1 10*3/uL (ref 0.0–0.7)
Eosinophils Relative: 1.5 % (ref 0.0–5.0)
HCT: 39.6 % (ref 36.0–46.0)
Hemoglobin: 12.9 g/dL (ref 12.0–15.0)
Lymphocytes Relative: 34.1 % (ref 12.0–46.0)
Lymphs Abs: 2.4 10*3/uL (ref 0.7–4.0)
MCHC: 32.6 g/dL (ref 30.0–36.0)
MCV: 75 fl — ABNORMAL LOW (ref 78.0–100.0)
Monocytes Absolute: 0.7 10*3/uL (ref 0.1–1.0)
Monocytes Relative: 9.9 % (ref 3.0–12.0)
Neutro Abs: 3.7 10*3/uL (ref 1.4–7.7)
Neutrophils Relative %: 53.3 % (ref 43.0–77.0)
Platelets: 289 10*3/uL (ref 150.0–400.0)
RBC: 5.28 Mil/uL — ABNORMAL HIGH (ref 3.87–5.11)
RDW: 14.9 % (ref 11.5–15.5)
WBC: 7 10*3/uL (ref 4.0–10.5)

## 2023-06-13 LAB — BASIC METABOLIC PANEL
CO2: 32 mEq/L (ref 19–32)
Calcium: 9.6 mg/dL (ref 8.4–10.5)
Chloride: 99 mEq/L (ref 96–112)
Creatinine, Ser: 0.88 mg/dL (ref 0.40–1.20)
GFR: 75.27 mL/min (ref >60.00)
Potassium: 3.9 mEq/L (ref 3.5–5.1)
Sodium: 138 mEq/L (ref 135–145)

## 2023-06-13 LAB — HEMOGLOBIN A1C: Hgb A1c MFr Bld: 6 % (ref 4.6–6.5)

## 2023-06-13 LAB — TSH: TSH: 3.62 u[IU]/mL (ref 0.35–5.50)

## 2023-06-17 ENCOUNTER — Telehealth: Payer: Self-pay

## 2023-06-17 NOTE — Telephone Encounter (Signed)
-----   Message from Shade Flood sent at 06/16/2023  9:48 PM EDT ----- Call patient.  Blood sugar only few points elevated, and 42-month blood sugar test is stable.  Other electrolytes look okay.  Red blood cells count was borderline elevated but other blood cells okay including total hemoglobin.  We can keep an eye on this with repeat labs at her next visit in September.  B12 level looked okay, only borderline elevated, not low which would be concerning for her symptoms.  Thyroid test was normal.  It looks like we were not able to get a urine test when she was in the office.  If still having frequent urination please schedule lab visit with point-of-care urinalysis.  Thanks.

## 2023-06-17 NOTE — Telephone Encounter (Signed)
Noted follow-up, no current urinary frequency and question about stopping multivitamin.  Okay to continue multivitamin, borderline elevated B12 not concerning.  If any return of urinary frequency, please  scheduled viisit to discuss further and repeat labs.

## 2023-06-17 NOTE — Telephone Encounter (Signed)
Called pt, no answerm LM to call back so we can discuss her question about continuation of multivitamin

## 2023-06-17 NOTE — Progress Notes (Signed)
Pt is aware of labs. Pt is taking Multi-vitamin and asked if she should continue. Pt reports no issues with urine frequency. Sh will call office if she has any.

## 2023-06-17 NOTE — Telephone Encounter (Signed)
Patient returned call and I let her know that it was ok to continue multivitamin

## 2023-06-25 ENCOUNTER — Encounter (INDEPENDENT_AMBULATORY_CARE_PROVIDER_SITE_OTHER): Payer: Self-pay | Admitting: Family Medicine

## 2023-06-25 ENCOUNTER — Ambulatory Visit (INDEPENDENT_AMBULATORY_CARE_PROVIDER_SITE_OTHER): Payer: Commercial Managed Care - PPO | Admitting: Family Medicine

## 2023-06-25 VITALS — BP 140/81 | HR 84 | Temp 98.2°F | Ht 62.0 in | Wt 282.0 lb

## 2023-06-25 DIAGNOSIS — Z1331 Encounter for screening for depression: Secondary | ICD-10-CM

## 2023-06-25 DIAGNOSIS — R5383 Other fatigue: Secondary | ICD-10-CM | POA: Diagnosis not present

## 2023-06-25 DIAGNOSIS — E65 Localized adiposity: Secondary | ICD-10-CM | POA: Diagnosis not present

## 2023-06-25 DIAGNOSIS — E1169 Type 2 diabetes mellitus with other specified complication: Secondary | ICD-10-CM

## 2023-06-25 DIAGNOSIS — E559 Vitamin D deficiency, unspecified: Secondary | ICD-10-CM | POA: Diagnosis not present

## 2023-06-25 DIAGNOSIS — E1159 Type 2 diabetes mellitus with other circulatory complications: Secondary | ICD-10-CM

## 2023-06-25 DIAGNOSIS — Z6841 Body Mass Index (BMI) 40.0 and over, adult: Secondary | ICD-10-CM | POA: Diagnosis not present

## 2023-06-25 DIAGNOSIS — E669 Obesity, unspecified: Secondary | ICD-10-CM

## 2023-06-25 DIAGNOSIS — I152 Hypertension secondary to endocrine disorders: Secondary | ICD-10-CM

## 2023-06-25 DIAGNOSIS — R0602 Shortness of breath: Secondary | ICD-10-CM

## 2023-06-25 NOTE — Progress Notes (Signed)
[name], D.O.  ABFM, ABOM Specializing in Clinical Bariatric Medicine Office located at: [address]  [place], [place]  [address]     Bariatric Medicine Visit  Dear [name], Deborah Partridge, MD   Thank you for referring [name] to our clinic today for evaluation.  We performed a consultation to discuss her options for treatment and educate the patient on her disease state.  The following note includes my evaluation and treatment recommendations.   Please do not hesitate to reach out to me directly if you have any further concerns.   Assessment and Plan:   Orders Placed This Encounter  Procedures   VITAMIN D 25 Hydroxy (Vit-D Deficiency, Fractures)   Folate   Insulin, random   Lipid Panel With LDL/HDL Ratio   T4, free   Hepatic function panel   T3   EKG 12-Lead    There are no discontinued medications.   No orders of the defined types were placed in this encounter.    Fatigue Assessment & Plan:  [name] does feel that her weight is causing her energy to be lower than it should be. Fatigue may be related to obesity, depression or many other causes. she does not appear to have any red flag symptoms and this appears to most likely be related to her current lifestyle habits and dietary intake.  Labs will be ordered and reviewed with her at their next office visit in two weeks.  Epworth sleepiness scale score appears to be within normal limits.  Her ESS score is 4 . [name] denies daytime somnolence.  [name] generally gets 7 or 8 hours of sleep per night, and states that she has generally restful sleep. Snoring is present. Apneic episodes is not present.   ECG: Performed and reviewed/ interpreted independently.  Normal sinus rhythm, rate 72 bpm; reassuring without any acute abnormalities, will continue to monitor for symptoms. Little to no change from ECG on 07/10/2022.   Depression screen [Z13.31]/Modified PHQ-9 Depression Screen: Her Food and Mood (modified PHQ-9)  score was 0.  In the meanwhile, [name] will focus on self care including making healthy food choices by following their meal plan, improving sleep quality and focusing on stress reduction.  Once we are assured she is on an appropriate meal plan, we will start discussing exercise to increase cardiovascular fitness levels.    Shortness of breath on exertion Assessment & Plan:  [name] does feel that she gets out of breath more easily than she used to when she exercises and seems to be worsening over time with weight gain.  This has gotten worse recently. [name] denies shortness of breath at rest or orthopnea. [name]'s shortness of breath appears to be obesity related and exercise induced, as they do not appear to have any "red flag" symptoms/ concerns today.  Also, this condition appears to be related to a state of poor cardiovascular conditioning   Obtain labs today and will be reviewed with her at their next office visit in two weeks.  Indirect Calorimeter completed today to help guide our dietary regimen. It shows a VO2 of 248 and a REE of 1714.  Her calculated basal metabolic rate is 6045 thus her measured basal metabolic rate is slightly worse than expected.  Patient agreed to work on weight loss at this time.  As [name] progresses through our weight loss program, we will gradually increase exercise as tolerated to treat her current condition.   If [name] follows our recommendations and loses  5-10% of their weight without improvement of her shortness of breath or if at any time, symptoms become more concerning, they agree to urgently follow up with their PCP/ specialist for further consideration/ evaluation.  [name] verbalizes agreement with this plan.    Type 2 diabetes mellitus with obesity (HCC) Assessment & Plan: 06/18/22: A1c of 6.8. No current meds. Diet/exercise approach.   Lab Results  Component Value Date   HGBA1C 6.0 06/12/2023   HGBA1C 5.9 01/21/2023   HGBA1C 6.0 09/25/2022    Will check labs  today. Begin to decrease simple carbs/ sugars; increase fiber and proteins -> follow her meal plan. Will begin to monitor condition alongside PCP/specialists as it relates to their weight loss journey    Hypertension associated with diabetes [facility]) Assessment & Plan: Pt endorses being diagnosed with HTN roughly 2 yrs ago. Condition is not at goal today - could be from anxiety about her first visit today. She is completely asymptomatic. HTN is treated with Norvasc 10 mg daily and Toprol-XL 50 mg daily. Dr.Greene increased Toprol-XL from 25 mg/day to 50 mg/day on 06/12/23.   Last 3 blood pressure readings in our office are as follows: BP Readings from Last 3 Encounters:  06/25/23 (!) 140/81  06/12/23 138/86  05/28/23 (!) 138/90   She will c/w all antihypertensives per Dr. Neva Marks. Begin with Prudent nutritional plan and low sodium diet, advance exercise as tolerated. We will begin to monitor closely alongside PCP/ specialists.  Pt reminded to also f/up with those individuals as instructed by them.    Vitamin D deficiency Assessment & Plan: This condition is treated with a daily Flintstones Complete Gummies Multivitamin and vitamin D3 2,000 international units daily.   Will check labs. Pt advised to maintain with both supplements. Will begin to monitor condition.   Visceral obesity Assessment & Plan: Disease counseling done.Visceral adipose tissue is a hormonally active component of total body fat.  This body composition phenotype is associated with medical disorders such as metabolic syndrome, cardiovascular disease and several malignancies including prostate, breast, and colorectal cancers.    Goal: Lose 7-10% of starting weight.  Limit Simple Carbs and High Fat foods- begin our meal plan. Visceral fat rating should be < 12 for a female.     TREATMENT PLAN FOR OBESITY: BMI 50.0-59.9, adult (HCC) Morbid obesity (HCC)starting BMI 06/25/23- 51.57 Assessment & Plan:  Muscle mass is 122.4 lbs.  Fat mass is 153.6 lbs.Total body water has is 98.2 lbs.   [name] is currently in the action stage of change. As such, her goal is to continue weight management plan. [name] will work on healthier eating habits and try their best to follow the Category 2 meal plan with B/L options and 100 optional snack calories best they can.   Behavioral Intervention Additional resources provided today: category 2 meal plan information, breakfast options, lunch options, and Cat 2 Grocery List Evidence-based interventions for health behavior change were utilized today including the discussion of self monitoring techniques, problem-solving barriers and SMART goal setting techniques.   Regarding patient's less desirable eating habits and patterns, we employed the technique of small changes.  Pt will specifically work on: beginning prescribed meal plan for next visit.    Recommended Physical Activity Goals [name] has been advised to gradually work up to 150 minutes of moderate intensity aerobic activity a week and strengthening exercises 2-3 times per week for cardiovascular health, weight loss maintenance and preservation of muscle mass. She has agreed to continue their current level  of activity  FOLLOW UP: Follow up in 2 weeks. She was informed of the importance of frequent follow up visits to maximize her success with intensive lifestyle modifications for her multiple health conditions.  [name] is aware that we will review all of her lab results at our next visit.  She is aware that if anything is critical/ life threatening with the results, we will be contacting her via MyChart prior to the office visit to discuss management.    Chief Complaint:   OBESITY [name] (MR# [id number]) is a 51 y.o. female who presents for evaluation and treatment of obesity and related comorbidities. Current BMI is Body mass index is 51.58 kg/m. [name] has been struggling with her weight for many years and has been unsuccessful  in either losing weight, maintaining weight loss, or reaching her healthy weight goal.  [name] is currently in the action stage of change and ready to dedicate time achieving and maintaining a healthier weight. [name] is interested in becoming our patient and working on intensive lifestyle modifications including (but not limited to) diet and exercise for weight loss.  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 Patient is married to [name]  and has 2 children. She lives with her husband and 22 y.o son and 69 y.o daughter.   Tykeria Gnau Lyness's habits were reviewed today and are as follows:   - Sometimes walks 25-30 minutes, 3x a wk.   - Has never been on a formal diet plan.   - Normally she does not eat out; Has take out or fast food once in a while.   - Craves rice. Recently switched from white to brown rice. She has rice 2x wkly  - Dislikes: frozen foods.   - Normally does not snack- once in a while she will have peanuts.   - Sometimes skips breakfast on the weekends when sleeping in.   - Caloric beverages: Sodas (once in a while), Coffee with [employer]life and sugar, tea with sugar, Fruit smoothies, Beer (occasionally at social events)   - Worst food habit: carbohydrate intake.   - Has lactose intolerance - avoids some cheeses and regular milk.   Subjective:   This is the patient's first visit at [facility].  The patient's NEW PATIENT PACKET that they filled out prior to today's office visit was reviewed at length and information from that paperwork was included within the following office visit note.    Included in the packet: current and past health history, medications, allergies, ROS, gynecologic history (women only), surgical history, family history, social history, weight history, weight loss surgery history (for those that have had weight loss surgery), nutritional evaluation, mood and food questionnaire along with a depression screening  (PHQ9) on all patients, an Epworth questionnaire, sleep habits questionnaire, patient life and health improvement goals questionnaire. These will all be scanned into the patient's chart under the "media" tab.   Review of Systems: Please refer to new patient packet scanned into media. Pertinent positives were addressed with patient today.  Reviewed by clinician on day of visit: allergies, medications, problem list, medical history, surgical history, family history, social history, and previous encounter notes.  During the visit, I independently reviewed the patient's EKG, bioimpedance scale results, and indirect calorimeter results. I used this information to tailor a meal plan for the patient that will help [name] to lose weight and will improve her obesity-related conditions going  forward.  I performed a medically necessary appropriate examination and/or evaluation. I discussed the assessment and treatment plan with the patient. The patient was provided an opportunity to ask questions and all were answered. The patient agreed with the plan and demonstrated an understanding of the instructions. Labs were ordered today (unless patient declined them) and will be reviewed with the patient at our next visit unless more critical results need to be addressed immediately. Clinical information was updated and documented in the EMR.   Objective:   PHYSICAL EXAM: Blood pressure (!) 140/81, pulse 84, temperature 98.2 F (36.8 C), height 5\' 2"  (1.575 m), weight 282 lb (127.9 kg), SpO2 97%. Body mass index is 51.58 kg/m. General: Well Developed, well nourished, and in no acute distress.  HEENT: Normocephalic, atraumatic Skin: Warm and dry, cap RF less 2 sec, good turgor Chest:  Normal excursion, shape, no gross abn Respiratory: speaking in full sentences, no conversational dyspnea NeuroM-Sk: Ambulates w/o assistance, moves * 4 Psych: A and O *3, insight good, mood-full  Anthropometric  Measurements Height: 5\' 2"  (1.575 m) Weight: 282 lb (127.9 kg) BMI (Calculated): 51.57 Weight at Last Visit: na Weight Lost Since Last Visit: na Weight Gained Since Last Visit: na Starting Weight: 282lb Total Weight Loss (lbs): 0 lb (0 kg) Peak Weight: 287lb Waist Measurement : 51 inches   Body Composition  Body Fat %: 54.4 % Fat Mass (lbs): 153.6 lbs Muscle Mass (lbs): 122.4 lbs Total Body Water (lbs): 98.2 lbs Visceral Fat Rating : 21   Other Clinical Data RMR: 1714 Fasting: yes Labs: yes Today's Visit #: 1 Starting Date: 06/25/23 Comments: first visit   DIAGNOSTIC DATA REVIEWED:  BMET    Component Value Date/Time   NA 138 06/12/2023 1609   NA 141 02/29/2020 0940   K 3.9 06/12/2023 1609   CL 99 06/12/2023 1609   CO2 32 06/12/2023 1609   GLUCOSE 102 (H) 06/12/2023 1609   BUN 14 06/12/2023 1609   BUN 13 02/29/2020 0940   CREATININE 0.88 06/12/2023 1609   CREATININE 0.61 10/19/2015 1052   CALCIUM 9.6 06/12/2023 1609   GFRNONAA 90 02/29/2020 0940   GFRNONAA >89 10/19/2015 1052   GFRAA 103 02/29/2020 0940   GFRAA >89 10/19/2015 1052   Lab Results  Component Value Date   HGBA1C 6.0 06/12/2023   HGBA1C 5.6 06/23/2014   No results found for: "INSULIN" Lab Results  Component Value Date   TSH 3.62 06/12/2023   CBC    Component Value Date/Time   WBC 7.0 06/12/2023 1609   RBC 5.28 (H) 06/12/2023 1609   HGB 12.9 06/12/2023 1609   HGB 12.9 01/21/2019 1050   HCT 39.6 06/12/2023 1609   HCT 38.8 01/21/2019 1050   PLT 289.0 06/12/2023 1609   PLT 272 01/21/2019 1050   MCV 75.0 (L) 06/12/2023 1609   MCV 75 (L) 01/21/2019 1050   MCH 25.0 (L) 01/21/2019 1050   MCH 23.0 (L) 02/13/2016 1845   MCHC 32.6 06/12/2023 1609   RDW 14.9 06/12/2023 1609   RDW 17.2 (H) 01/21/2019 1050   Iron Studies    Component Value Date/Time   IRON 73 11/08/2017 1642   TIBC 363 01/31/2017 1725   FERRITIN 7 (L) 02/13/2016 1845   IRONPCTSAT 12 (L) 01/31/2017 1725   Lipid Panel      Component Value Date/Time   CHOL 162 01/21/2023 1607   CHOL 151 02/29/2020 0940   TRIG 152.0 (H) 01/21/2023 1607   HDL 41.90 01/21/2023 1607  HDL 47 02/29/2020 0940   CHOLHDL 4 01/21/2023 1607   VLDL 30.4 01/21/2023 1607   LDLCALC 90 01/21/2023 1607   LDLCALC 92 02/29/2020 0940   Hepatic Function Panel     Component Value Date/Time   PROT 7.4 01/21/2023 1607   PROT 6.8 02/29/2020 0940   ALBUMIN 4.2 01/21/2023 1607   ALBUMIN 4.3 02/29/2020 0940   AST 20 01/21/2023 1607   ALT 24 01/21/2023 1607   ALKPHOS 78 01/21/2023 1607   BILITOT 0.3 01/21/2023 1607   BILITOT 0.2 02/29/2020 0940      Component Value Date/Time   TSH 3.62 06/12/2023 1609   Nutritional No results found for: "VD25OH"  Attestation Statements:   I, Special Puri , acting as a Stage manager for [employer], DO., have compiled all relevant documentation for today's office visit on behalf of Thomasene Lot, DO, while in the presence of [employer], DO.  Time spent on visit including pre-visit chart review and post-visit care was estimated to be 65 minutes. Over 50% of the time was spent in direct face to face counseling and coordination of care.  I have reviewed the above documentation for accuracy and completeness, and I agree with the above. [name], D.O.  The 21st Century Cures Act was signed into law in 2016 which includes the topic of electronic health records.  This provides immediate access to information in MyChart.  This includes consultation notes, operative notes, office notes, lab results and pathology reports.  If you have any questions about what you read please let us know at your next visit so we can discuss your concerns and take corrective action if need be.  We are right here with you.

## 2023-07-04 ENCOUNTER — Telehealth: Payer: Self-pay | Admitting: Family Medicine

## 2023-07-04 NOTE — Telephone Encounter (Signed)
Caller name: NESREEN PHILIPPS  On DPR?: Yes  Call back number: [phone] (home)  Provider they see: Shade Flood, MD  Reason for call:   Pt is scheduled for a physical in Sept but her last one was June 23. She needs it in Aug so her premium doesn't go up. Neva Seat is full. She's like to speak to to [employer]

## 2023-07-04 NOTE — Telephone Encounter (Signed)
I have double checked the only date I could do is 07/12/23 and replace your same day spot is this okay or does pt need to wait to be seen

## 2023-07-04 NOTE — Telephone Encounter (Signed)
There is an appt 07/12/23 for the same day I could replace otherwise there is nothing available till her current date

## 2023-07-04 NOTE — Telephone Encounter (Signed)
Appt made

## 2023-07-04 NOTE — Telephone Encounter (Signed)
Okay to make an exception that day for physical.  Can schedule on 8/23.  Thanks

## 2023-07-09 ENCOUNTER — Other Ambulatory Visit: Payer: Self-pay

## 2023-07-09 ENCOUNTER — Encounter (INDEPENDENT_AMBULATORY_CARE_PROVIDER_SITE_OTHER): Payer: Self-pay | Admitting: Family Medicine

## 2023-07-09 ENCOUNTER — Ambulatory Visit (INDEPENDENT_AMBULATORY_CARE_PROVIDER_SITE_OTHER): Payer: Commercial Managed Care - PPO | Admitting: Family Medicine

## 2023-07-09 ENCOUNTER — Other Ambulatory Visit (HOSPITAL_COMMUNITY): Payer: Self-pay

## 2023-07-09 VITALS — BP 144/84 | HR 84 | Temp 98.6°F | Ht 62.0 in | Wt 282.0 lb

## 2023-07-09 DIAGNOSIS — E559 Vitamin D deficiency, unspecified: Secondary | ICD-10-CM | POA: Diagnosis not present

## 2023-07-09 DIAGNOSIS — E669 Obesity, unspecified: Secondary | ICD-10-CM

## 2023-07-09 DIAGNOSIS — I152 Hypertension secondary to endocrine disorders: Secondary | ICD-10-CM | POA: Diagnosis not present

## 2023-07-09 DIAGNOSIS — Z6841 Body Mass Index (BMI) 40.0 and over, adult: Secondary | ICD-10-CM

## 2023-07-09 DIAGNOSIS — E1159 Type 2 diabetes mellitus with other circulatory complications: Secondary | ICD-10-CM

## 2023-07-09 DIAGNOSIS — E1169 Type 2 diabetes mellitus with other specified complication: Secondary | ICD-10-CM

## 2023-07-09 MED ORDER — VITAMIN D (ERGOCALCIFEROL) 1.25 MG (50000 UNIT) PO CAPS
50000.0000 [IU] | ORAL_CAPSULE | ORAL | 0 refills | Status: DC
  Filled 2023-07-09 – 2023-07-11 (×2): qty 4, 28d supply, fill #0

## 2023-07-09 NOTE — Progress Notes (Signed)
[name], D.O.  ABFM, ABOM Clinical Bariatric Medicine Physician  Office located at: [address] [place], [place]  [address]     Assessment and Plan:   Medications Discontinued During This Encounter  Medication Reason   Cholecalciferol (VITAMIN D3 PO)      Meds ordered this encounter  Medications   Vitamin D, Ergocalciferol, (DRISDOL) 1.25 MG (50000 UNIT) CAPS capsule    Sig: Take 1 capsule (50,000 Units total) by mouth every 7 (seven) days.    Dispense:  4 capsule    Refill:  0     Type 2 diabetes mellitus with obesity (HCC) Assessment & Plan: Does report having hunger and cravings. Insulin levels are roughly 4 times normal. No concerns with thyroid and folate levels. Her HDL levels are too low and goal LDL is <70. Liver enzymes are within normal limits.   Lab Results  Component Value Date   HGBA1C 6.0 06/12/2023   HGBA1C 5.9 01/21/2023   HGBA1C 6.0 09/25/2022   INSULIN 19.5 06/25/2023   Lab Results  Component Value Date   TSH 3.62 06/12/2023   FREET4 1.23 06/25/2023    Lab Results  Component Value Date   FOLATE 9.8 06/25/2023      Component Value Date/Time   PROT 7.1 06/25/2023 0906   ALBUMIN 4.4 06/25/2023 0906   AST 16 06/25/2023 0906   ALT 15 06/25/2023 0906   ALKPHOS 70 06/25/2023 0906   BILITOT 0.2 06/25/2023 0906   BILIDIR <0.10 06/25/2023 0906    Lab Results  Component Value Date   CHOL 149 06/25/2023   HDL 35 (L) 06/25/2023   LDLCALC 92 06/25/2023   TRIG 119 06/25/2023   CHOLHDL 4 01/21/2023   The 10-year ASCVD risk score (Arnett DK, et al., 2019) is: 18.8%   Values used to calculate the score:     Age: 52 years     Sex: Female     Is Non-Hispanic African American: Yes     Diabetic: Yes     Tobacco smoker: No     Systolic Blood Pressure: 144 mmHg     Is BP treated: Yes     HDL Cholesterol: 35 mg/dL     Total Cholesterol: 149 mg/dL  I counseled patient on pathophysiology of the disease process Diabetes Mellitus.  Explained role of simple carbs and insulin levels on hunger and cravings. Educated patient that having protein with each meal is important for stabilizing sugars and an important part of her diabetes management as well as controlling hunger and cravings. Limit beef intake to 2 days a week.  We also recommend: aerobic activity with eventual goal of a minimum of 150+ min wk plus 2 days/ week of resistance or strength training.  [name] agrees to continue our treatment plan of a heart-heathy, low cholesterol meal plan. We will recheck labs in approximately 3 months from last check, or as deemed appropriate.    Hypertension associated with diabetes (HCC) Assessment & Plan: HTN is treated with Norvasc 10 mg daily and Toprol-XL 50 mg daily. Blood pressure is not at goal today. She is completely asymptomatic.   Last 3 blood pressure readings in our office are as follows: BP Readings from Last 3 Encounters:  07/09/23 (!) 144/84  06/25/23 (!) 140/81  06/12/23 138/86   Advised [name] to discuss with Dr.Greene at next OV if he would entertain an alternative to Toprol-XL as this medication can possibly make weight loss more challenging. Continue with  Prudent nutritional plan and low sodium diet, advance exercise as tolerated. We will continue to monitor symptoms as they relate to the her weight loss journey.   Vitamin D deficiency Assessment & Plan: Current treatment: OTC Vitamin D3 2,000 international units daily. Vitamin D levels are not within the recommended range of 50 to 70-80.   Lab Results  Component Value Date   VD25OH 34.1 06/25/2023   I discussed the importance of vitamin D to the patient's health and well-being as well as to their ability to lose weight.  I reviewed possible symptoms of low Vitamin D:  low energy, depressed mood, muscle aches, joint aches, osteoporosis etc. with patient. It has been show that administration of vitamin D supplementation leads to improved satiety and a  decrease in inflammatory markers.  Hence, low Vitamin D levels may be linked to an increased risk of cardiovascular events and even increased risk of cancers- such as colon and breast. Pt has been instructed to discontinue OTC Vitamin D and begin ERGO 50,000 internationals units.    TREATMENT PLAN FOR OBESITY: BMI 50.0-59.9, adult (HCC) - current BMI 51.57 Morbid obesity (HCC)starting BMI 06/25/23- 51.57 Assessment & Plan: [name] is here to discuss her progress with her obesity treatment plan along with follow-up of her obesity related diagnoses. See Medical Weight Management Flowsheet for complete bioelectrical impedance results.  Since last office visit patient's  muscle mass has decreased by 4 lb. Fat mass has increased by 3.8 lb. Counseling done on how various foods will affect these numbers and how to maximize success  Total lbs lost to date: 0 Total weight loss percentage to date: 0    No change to meal plan - see Subjective  Behavioral Intervention Additional resources provided today: category 2 meal plan information Evidence-based interventions for health behavior change were utilized today including the discussion of self monitoring techniques, problem-solving barriers and SMART goal setting techniques.   Regarding patient's less desirable eating habits and patterns, we employed the technique of small changes.  Pt will specifically work on: increasing lean protein intake, decreasing carbs, & overall following meal plan more closely. for next visit.   FOLLOW UP: Return 2-3 wks. She was informed of the importance of frequent follow up visits to maximize her success with intensive lifestyle modifications for her multiple health conditions.  Subjective:   Chief complaint: Obesity [name] is here to discuss her progress with her obesity treatment plan. She is on the Category 2 Plan with B/L options and 100 optional snack calories and states she is following her eating plan  approximately 90% of the time. She states she is using the treadmill 30-40 minutes 3 days per wk and walking 40-50 minutes, 2 days per wk.   Interval History:  [name] is here today for her first follow-up office visit since starting the program with [place].  Since last office visit:  - Reports liking the meal plan.   - For breakfast typically has 1 boiled egg, 2 slices of bread, and a Milo Chocolate drink (180 cal, 6 grams protein) OR Special K protein cereal with [employer].   - For lunch, she ordinarily has a [place] sandwich with 3 ounces of lean protein.   - Reports frequently having fruit smoothies with blueberries, strawberries, grapes, watermelon, apples, and spinach.   - Does report having hunger and cravings.   All blood work/ lab tests that were recently ordered by myself or an outside provider were reviewed with patient today per  their request. Extended time was spent counseling her on all new disease processes that were discovered or preexisting ones that are affected by BMI.  she understands that many of these abnormalities will need to monitored regularly along with the current treatment plan of prudent dietary changes, in which we are making each and every office visit, to improve these health parameters.   Review of Systems:  Pertinent positives were addressed with patient today.  Weight Summary and Biometrics   Weight Lost Since Last Visit: 0lb  Weight Gained Since Last Visit: 0lb   Vitals Temp: 98.6 F (37 C) BP: (!) 144/84 Pulse Rate: 84 SpO2: 100 %   Anthropometric Measurements Height: 5\' 2"  (1.575 m) Weight: 282 lb (127.9 kg) BMI (Calculated): 51.57 Weight at Last Visit: 282lb Weight Lost Since Last Visit: 0lb Weight Gained Since Last Visit: 0lb Starting Weight: 282lb Total Weight Loss (lbs): 0 lb (0 kg) Peak Weight: 287lb   Body Composition  Body Fat %: 55.8 % Fat Mass (lbs): 157.4 lbs Muscle Mass (lbs): 118.4 lbs Visceral Fat Rating :  21   Other Clinical Data Fasting: no Labs: no Today's Visit #: 2 Starting Date: 06/25/23   Objective:   PHYSICAL EXAM:  Blood pressure (!) 144/84, pulse 84, temperature 98.6 F (37 C), height 5\' 2"  (1.575 m), weight 282 lb (127.9 kg), SpO2 100%. Body mass index is 51.58 kg/m.  General: Well Developed, well nourished, and in no acute distress.  HEENT: Normocephalic, atraumatic Skin: Warm and dry, cap RF less 2 sec, good turgor Chest:  Normal excursion, shape, no gross abn Respiratory: speaking in full sentences, no conversational dyspnea NeuroM-Sk: Ambulates w/o assistance, moves * 4 Psych: A and O *3, insight good, mood-full  DIAGNOSTIC DATA REVIEWED:  BMET    Component Value Date/Time   NA 138 06/12/2023 1609   NA 141 02/29/2020 0940   K 3.9 06/12/2023 1609   CL 99 06/12/2023 1609   CO2 32 06/12/2023 1609   GLUCOSE 102 (H) 06/12/2023 1609   BUN 14 06/12/2023 1609   BUN 13 02/29/2020 0940   CREATININE 0.88 06/12/2023 1609   CREATININE 0.61 10/19/2015 1052   CALCIUM 9.6 06/12/2023 1609   GFRNONAA 90 02/29/2020 0940   GFRNONAA >89 10/19/2015 1052   GFRAA 103 02/29/2020 0940   GFRAA >89 10/19/2015 1052   Lab Results  Component Value Date   HGBA1C 6.0 06/12/2023   HGBA1C 5.6 06/23/2014   Lab Results  Component Value Date   INSULIN 19.5 06/25/2023   Lab Results  Component Value Date   TSH 3.62 06/12/2023   CBC    Component Value Date/Time   WBC 7.0 06/12/2023 1609   RBC 5.28 (H) 06/12/2023 1609   HGB 12.9 06/12/2023 1609   HGB 12.9 01/21/2019 1050   HCT 39.6 06/12/2023 1609   HCT 38.8 01/21/2019 1050   PLT 289.0 06/12/2023 1609   PLT 272 01/21/2019 1050   MCV 75.0 (L) 06/12/2023 1609   MCV 75 (L) 01/21/2019 1050   MCH 25.0 (L) 01/21/2019 1050   MCH 23.0 (L) 02/13/2016 1845   MCHC 32.6 06/12/2023 1609   RDW 14.9 06/12/2023 1609   RDW 17.2 (H) 01/21/2019 1050   Iron Studies    Component Value Date/Time   IRON 73 11/08/2017 1642   TIBC 363  01/31/2017 1725   FERRITIN 7 (L) 02/13/2016 1845   IRONPCTSAT 12 (L) 01/31/2017 1725   Lipid Panel     Component Value Date/Time   CHOL 149  06/25/2023 0906   TRIG 119 06/25/2023 0906   HDL 35 (L) 06/25/2023 0906   CHOLHDL 4 01/21/2023 1607   VLDL 30.4 01/21/2023 1607   LDLCALC 92 06/25/2023 0906   Hepatic Function Panel     Component Value Date/Time   PROT 7.1 06/25/2023 0906   ALBUMIN 4.4 06/25/2023 0906   AST 16 06/25/2023 0906   ALT 15 06/25/2023 0906   ALKPHOS 70 06/25/2023 0906   BILITOT 0.2 06/25/2023 0906   BILIDIR <0.10 06/25/2023 0906      Component Value Date/Time   TSH 3.62 06/12/2023 1609   Nutritional Lab Results  Component Value Date   VD25OH 34.1 06/25/2023    Attestations:   Reviewed by clinician on day of visit: allergies, medications, problem list, medical history, surgical history, family history, social history, and previous encounter notes.   Patient was in the office today and time spent on visit including pre-visit chart review and post-visit care/coordination of care and electronic medical record documentation was 44 minutes. 50% of the time was in face to face counseling of this patient's medical condition(s) and providing education on treatment options to include the first-line treatment of diet and lifestyle modification.  I, Special Randolm Idol, acting as a Stage manager for [employer], DO., have compiled all relevant documentation for today's office visit on behalf of Thomasene Lot, DO, while in the presence of [employer], DO.  I have reviewed the above documentation for accuracy and completeness, and I agree with the above. [name], D.O.  The 21st Century Cures Act was signed into law in 2016 which includes the topic of electronic health records.  This provides immediate access to information in MyChart.  This includes consultation notes, operative notes, office notes, lab results and pathology reports.  If you have any  questions about what you read please let us know at your next visit so we can discuss your concerns and take corrective action if need be.  We are right here with you.

## 2023-07-11 ENCOUNTER — Other Ambulatory Visit (HOSPITAL_COMMUNITY): Payer: Self-pay

## 2023-07-12 ENCOUNTER — Other Ambulatory Visit (HOSPITAL_COMMUNITY): Payer: Self-pay

## 2023-07-12 ENCOUNTER — Ambulatory Visit: Payer: Commercial Managed Care - PPO | Admitting: Family Medicine

## 2023-07-12 VITALS — BP 132/82 | HR 72 | Temp 97.5°F | Resp 16 | Ht 62.0 in | Wt 283.2 lb

## 2023-07-12 DIAGNOSIS — I10 Essential (primary) hypertension: Secondary | ICD-10-CM | POA: Diagnosis not present

## 2023-07-12 DIAGNOSIS — R208 Other disturbances of skin sensation: Secondary | ICD-10-CM | POA: Diagnosis not present

## 2023-07-12 DIAGNOSIS — Z0000 Encounter for general adult medical examination without abnormal findings: Secondary | ICD-10-CM | POA: Diagnosis not present

## 2023-07-12 MED ORDER — METOPROLOL SUCCINATE ER 25 MG PO TB24
50.0000 mg | ORAL_TABLET | Freq: Every day | ORAL | 1 refills | Status: DC
  Filled 2023-07-12: qty 90, 45d supply, fill #0

## 2023-07-12 MED ORDER — LOSARTAN POTASSIUM 25 MG PO TABS
25.0000 mg | ORAL_TABLET | Freq: Every day | ORAL | 1 refills | Status: DC
  Filled 2023-07-12: qty 90, 90d supply, fill #0
  Filled 2023-09-08: qty 90, 90d supply, fill #1

## 2023-07-12 MED ORDER — GABAPENTIN 100 MG PO CAPS
100.0000 mg | ORAL_CAPSULE | Freq: Every day | ORAL | 1 refills | Status: DC
  Filled 2023-07-12: qty 30, 30d supply, fill #0

## 2023-07-12 MED ORDER — METOPROLOL SUCCINATE ER 25 MG PO TB24
25.0000 mg | ORAL_TABLET | Freq: Every day | ORAL | 1 refills | Status: DC
  Filled 2023-07-12: qty 90, 90d supply, fill #0
  Filled 2023-10-09: qty 90, 90d supply, fill #1

## 2023-07-12 NOTE — Patient Instructions (Addendum)
I am sorry to hear about the new fatigue with the higher dose of metoprolol.  Lets restart the 25 mg dose once per day but will need to add another medication for blood pressure control.  I have ordered a medicine called losartan 25 mg once per day.  Continue same dose of amlodipine for now and let's recheck in the next 3 to 4 weeks for repeat blood pressure test and discussion of that medicine.  Gabapentin once at bedtime may help with foot burning. Start low dose at bedtime initially.   No other medication changes at this time.  We will check blood work at your follow-up visit as I will need to recheck your kidney function test with the new medicine at that time.  If any new or worsening symptoms prior to follow-up please be seen.  Thank you for coming in today and take care.  Preventive Care 53-35 Years Old, Female Preventive care refers to lifestyle choices and visits with your health care provider that can promote health and wellness. Preventive care visits are also called wellness exams. What can I expect for my preventive care visit? Counseling Your health care provider may ask you questions about your: Medical history, including: Past medical problems. Family medical history. Pregnancy history. Current health, including: Menstrual cycle. Method of birth control. Emotional well-being. Home life and relationship well-being. Sexual activity and sexual health. Lifestyle, including: Alcohol, nicotine or tobacco, and drug use. Access to firearms. Diet, exercise, and sleep habits. Work and work Astronomer. Sunscreen use. Safety issues such as seatbelt and bike helmet use. Physical exam Your health care provider will check your: Height and weight. These may be used to calculate your BMI (body mass index). BMI is a measurement that tells if you are at a healthy weight. Waist circumference. This measures the distance around your waistline. This measurement also tells if you are at a  healthy weight and may help predict your risk of certain diseases, such as type 2 diabetes and high blood pressure. Heart rate and blood pressure. Body temperature. Skin for abnormal spots. What immunizations do I need?  Vaccines are usually given at various ages, according to a schedule. Your health care provider will recommend vaccines for you based on your age, medical history, and lifestyle or other factors, such as travel or where you work. What tests do I need? Screening Your health care provider may recommend screening tests for certain conditions. This may include: Lipid and cholesterol levels. Diabetes screening. This is done by checking your blood sugar (glucose) after you have not eaten for a while (fasting). Pelvic exam and Pap test. Hepatitis B test. Hepatitis C test. HIV (human immunodeficiency virus) test. STI (sexually transmitted infection) testing, if you are at risk. Lung cancer screening. Colorectal cancer screening. Mammogram. Talk with your health care provider about when you should start having regular mammograms. This may depend on whether you have a family history of breast cancer. BRCA-related cancer screening. This may be done if you have a family history of breast, ovarian, tubal, or peritoneal cancers. Bone density scan. This is done to screen for osteoporosis. Talk with your health care provider about your test results, treatment options, and if necessary, the need for more tests. Follow these instructions at home: Eating and drinking  Eat a diet that includes fresh fruits and vegetables, whole grains, lean protein, and low-fat dairy products. Take vitamin and mineral supplements as recommended by your health care provider. Do not drink alcohol if: Your health care  provider tells you not to drink. You are pregnant, may be pregnant, or are planning to become pregnant. If you drink alcohol: Limit how much you have to 0-1 drink a day. Know how much alcohol  is in your drink. In the [place] one drink equals one 12 oz bottle of beer (355 mL), one 5 oz glass of wine (148 mL), or one 1 oz glass of hard liquor (44 mL). Lifestyle Brush your teeth every morning and night with fluoride toothpaste. Floss one time each day. Exercise for at least 30 minutes 5 or more days each week. Do not use any products that contain nicotine or tobacco. These products include cigarettes, chewing tobacco, and vaping devices, such as e-cigarettes. If you need help quitting, ask your health care provider. Do not use drugs. If you are sexually active, practice safe sex. Use a condom or other form of protection to prevent STIs. If you do not wish to become pregnant, use a form of birth control. If you plan to become pregnant, see your health care provider for a prepregnancy visit. Take aspirin only as told by your health care provider. Make sure that you understand how much to take and what form to take. Work with your health care provider to find out whether it is safe and beneficial for you to take aspirin daily. Find healthy ways to manage stress, such as: Meditation, yoga, or listening to music. Journaling. Talking to a trusted person. Spending time with friends and family. Minimize exposure to UV radiation to reduce your risk of skin cancer. Safety Always wear your seat belt while driving or riding in a vehicle. Do not drive: If you have been drinking alcohol. Do not ride with someone who has been drinking. When you are tired or distracted. While texting. If you have been using any mind-altering substances or drugs. Wear a helmet and other protective equipment during sports activities. If you have firearms in your house, make sure you follow all gun safety procedures. Seek help if you have been physically or sexually abused. What's next? Visit your health care provider once a year for an annual wellness visit. Ask your health care provider how often you should have  your eyes and teeth checked. Stay up to date on all vaccines. This information is not intended to replace advice given to you by your health care provider. Make sure you discuss any questions you have with your health care provider. Document Revised: 05/03/2021 Document Reviewed: 05/03/2021 Elsevier Patient Education  2024 [employer].

## 2023-07-12 NOTE — Progress Notes (Unsigned)
Subjective:  Patient ID: [name], female    DOB: 1971-09-11  Age: 52 y.o. MRN: [id number]  CC:  Chief Complaint  Patient presents with  . Annual Exam    Annual Exam    HPI [name] presents for Annual Exam Followed by healthy weight and wellness, Dr. Sharee Holster.  Recent visit August 20, now on higher dose vitamin D weekly .  Cardiology, Dr. Cristal Deer Gynecology, Dr. Marcelle Overlie.  Records requested for pap test and Mammogram.   Doing well, no health changes since last visit.  Labs 8/6.   Hypertension: Improved blood pressure on higher dose of Toprol.  Continued on 10 mg amlodipine.  Has seen cardiology, Ozempic tried but unable to continue due to insurance coverage. Foot dysesthesias discussed in July, B12, TSH were normal. Borderline elevated RBC but normal hemoglobin on previous labs. Frequent urination discussed in July.  Option of urinalysis if persistent symptoms.  Follow-up call on July 29, no urinary frequency at that time. Urinating well.  Foot tingling still intermittent, not daily. 4 days per week - burning sensation.  No back pain, no leg weakness. No current hand sx's.  Lab Results  Component Value Date   TSH 3.62 06/12/2023   Lab Results  Component Value Date   VITAMINB12 925 (H) 06/12/2023     Feeling sluggish on 50mg  toprol dose.  Home readings: BP Readings from Last 3 Encounters:  07/12/23 132/82  07/09/23 (!) 144/84  06/25/23 (!) 140/81   Lab Results  Component Value Date   CREATININE 0.88 06/12/2023   Lab Results  Component Value Date   HGBA1C 6.0 06/12/2023        07/12/2023   12:07 PM 05/28/2023    4:29 PM 01/21/2023    3:22 PM 09/24/2022    3:12 PM 06/18/2022    8:17 AM  Depression screen PHQ 2/9  Decreased Interest 0 1 0 0 0  Down, Depressed, Hopeless 0 0 0 0 0  PHQ - 2 Score 0 1 0 0 0  Altered sleeping 0 1 0 0   Tired, decreased energy 0 1 0 1   Change in appetite 0 0 0 0   Feeling bad or failure about yourself  0 0 0 0    Trouble concentrating 0 0 0 0   Moving slowly or fidgety/restless 0 0 0 0   Suicidal thoughts 0 0 0 0   PHQ-9 Score 0 3 0 1   Difficult doing work/chores Not difficult at all Not difficult at all       Health Maintenance  Topic Date Due  . FOOT EXAM  Never done  . OPHTHALMOLOGY EXAM  Never done  . COVID-19 Vaccine (4 - 2023-24 season) 07/20/2022  . MAMMOGRAM  03/24/2023  . INFLUENZA VACCINE  06/20/2023  . HEMOGLOBIN A1C  12/13/2023  . Diabetic kidney evaluation - Urine ACR  01/21/2024  . Diabetic kidney evaluation - eGFR measurement  06/11/2024  . PAP SMEAR-Modifier  04/02/2025  . DTaP/Tdap/Td (2 - Td or Tdap) 11/09/2027  . Colonoscopy  06/19/2032  . Hepatitis C Screening  Completed  . HIV Screening  Completed  . Zoster Vaccines- Shingrix  Completed  . HPV VACCINES  Aged Out  Colonoscopy last year.  Recent mammogram, pap at gyn.   Immunization History  Administered Date(s) Administered  . Influenza Inj Mdck Quad Pf 07/22/2022  . Influenza,inj,Quad PF,6+ Mos 07/20/2016  . Influenza-Unspecified 08/01/2021  . PFIZER(Purple Top)SARS-COV-2 Vaccination 11/24/2019, 12/14/2019, 09/16/2020  . Tdap  11/08/2017  . Zoster Recombinant(Shingrix) 04/13/2021, 04/19/2022  Covid and flu vaccine when available.   No results found. Optho appt in few weeks 9/17.   Dental: every 6 months.   Alcohol: none regular  Tobacco: none  Exercise: gym 2 times per week, treadmill and walking 3 times per week.    History Patient Active Problem List   Diagnosis Date Noted  . Diabetes mellitus without complication (HCC) 01/22/2023  . Prediabetes 09/24/2022  . History of anemia 09/24/2022  . PONV (postoperative nausea and vomiting) 07/04/2022  . Essential hypertension 07/04/2022  . Anemia 07/04/2022  . 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  . Class 3 severe obesity without serious comorbidity with body mass index (BMI) of 50.0 to 59.9 in adult [facility]) 02/15/2020   Past Medical History:   Diagnosis Date  . Anemia   . Difficult airway for intubation    Pt.denies updated 06/12/22  . Headache(784.0)   . Hypertension   . Lactose intolerance   . PONV (postoperative nausea and vomiting)   . Pre-diabetes    Past Surgical History:  Procedure Laterality Date  . COLONOSCOPY WITH PROPOFOL N/A 06/19/2022   Procedure: COLONOSCOPY WITH PROPOFOL;  Surgeon: Jenel Lucks, MD;  Location: [facility];  Service: Gastroenterology;  Laterality: N/A;  . DILATION AND CURETTAGE OF UTERUS  2000  . DILITATION & CURRETTAGE/HYSTROSCOPY WITH NOVASURE ABLATION N/A 02/05/2013   Procedure: DILATATION & CURETTAGE/HYSTEROSCOPY WITH NOVASURE ABLATION;  Surgeon: Meriel Pica, MD;  Location: [facility];  Service: Gynecology;  Laterality: N/A;  . LAPAROSCOPIC TUBAL LIGATION Bilateral 02/05/2013   Procedure: LAPAROSCOPIC TUBAL LIGATION;  Surgeon: Meriel Pica, MD;  Location: [facility];  Service: Gynecology;  Laterality: Bilateral;  attempted tubal ligation  . POLYPECTOMY  06/19/2022   Procedure: POLYPECTOMY;  Surgeon: Jenel Lucks, MD;  Location: [facility];  Service: Gastroenterology;;   Allergies  Allergen Reactions  . Lactose Intolerance (Gi) Other (See Comments)    Cheese/milk products - GI upset   Prior to Admission medications   Medication Sig Start Date End Date Taking? Authorizing Provider  amLODipine (NORVASC) 10 MG tablet Take 1 tablet (10 mg total) by mouth daily. 06/12/23   Shade Flood, MD  Ascorbic Acid (VITAMIN C PO) Take 1 tablet by mouth in the morning.    [provider]  Biotin 35009 MCG TABS Take by mouth daily.    [provider]  Blood Pressure Monitoring (BLOOD PRESSURE MONITOR 3) DEVI Use as directed. 04/09/22     metoprolol succinate (TOPROL-XL) 50 MG 24 hr tablet Take 1 tablet (50 mg total) by mouth daily. Take with or immediately following a meal. 06/12/23   Shade Flood, MD  Multiple Vitamin (MULTIVITAMIN WITH MINERALS) TABS tablet Take 1  tablet by mouth in the morning. Centrum for Women 50+    [provider]  Vitamin D, Ergocalciferol, (DRISDOL) 1.25 MG (50000 UNIT) CAPS capsule Take 1 capsule (50,000 Units total) by mouth every 7 (seven) days. 07/09/23   Thomasene Lot, DO   Social History   Socioeconomic History  . Marital status: Married    Spouse name: Not on file  . Number of children: Not on file  . Years of education: Not on file  . Highest education level: Associate degree: occupational, Scientist, product/process development, or vocational program  Occupational History  . Not on file  Tobacco Use  . Smoking status: Never    Passive exposure: Never  . Smokeless tobacco: Never  Vaping Use  . Vaping status:  Never Used  Substance and Sexual Activity  . Alcohol use: Yes    Comment: occas  . Drug use: No  . Sexual activity: Yes  Other Topics Concern  . Not on file  Social History Narrative  . Not on file   Social Determinants of Health   Financial Resource Strain: Low Risk  (07/11/2023)   Overall Financial Resource Strain (CARDIA)   . Difficulty of Paying Living Expenses: Not hard at all  Food Insecurity: No Food Insecurity (07/11/2023)   Hunger Vital Sign   . Worried About Programme researcher, broadcasting/film/video in the Last Year: Never true   . Ran Out of Food in the Last Year: Never true  Transportation Needs: No Transportation Needs (07/11/2023)   PRAPARE - Transportation   . Lack of Transportation (Medical): No   . Lack of Transportation (Non-Medical): No  Physical Activity: Sufficiently Active (07/11/2023)   Exercise Vital Sign   . Days of Exercise per Week: 4 days   . Minutes of Exercise per Session: 40 min  Stress: No Stress Concern Present (07/11/2023)   [employer] of Occupational Health - Occupational Stress Questionnaire   . Feeling of Stress : Not at all  Social Connections: Unknown (07/11/2023)   Social Connection and Isolation Panel [NHANES]   . Frequency of Communication with Friends and Family: More than three times a  week   . Frequency of Social Gatherings with Friends and Family: Patient declined   . Attends Religious Services: Patient declined   . Active Member of Clubs or Organizations: Yes   . Attends Banker Meetings: More than 4 times per year   . Marital Status: Living with partner  Intimate Partner Violence: Not on file    Review of Systems 13 point review of systems per patient health survey noted.  Negative other than as indicated above or in HPI.    Objective:   Vitals:   07/12/23 1200  BP: 132/82  Pulse: 72  Resp: 16  Temp: (!) 97.5 F (36.4 C)  TempSrc: Oral  SpO2: 95%  Weight: 283 lb 3.2 oz (128.5 kg)  Height: 5\' 2"  (1.575 m)   {Vitals History (Optional):23777}  Physical Exam Vitals reviewed.  Constitutional:      Appearance: She is well-developed.  HENT:     Head: Normocephalic and atraumatic.     Right Ear: External ear normal.     Left Ear: External ear normal.  Eyes:     Conjunctiva/sclera: Conjunctivae normal.     Pupils: Pupils are equal, round, and reactive to light.  Neck:     Thyroid: No thyromegaly.  Cardiovascular:     Rate and Rhythm: Normal rate and regular rhythm.     Heart sounds: Normal heart sounds. No murmur heard. Pulmonary:     Effort: Pulmonary effort is normal. No respiratory distress.     Breath sounds: Normal breath sounds. No wheezing.  Abdominal:     General: Bowel sounds are normal.     Palpations: Abdomen is soft.     Tenderness: There is no abdominal tenderness.  Musculoskeletal:        General: No tenderness. Normal range of motion.     Cervical back: Normal range of motion and neck supple.  Lymphadenopathy:     Cervical: No cervical adenopathy.  Skin:    General: Skin is warm and dry.     Findings: No rash.  Neurological:     Mental Status: She is alert and oriented to person,  place, and time.  Psychiatric:        Behavior: Behavior normal.        Thought Content: Thought content normal.      Assessment  & Plan:  [name] is a 52 y.o. female . No diagnosis found.   No orders of the defined types were placed in this encounter.  There are no Patient Instructions on file for this visit.    Signed,   Meredith Staggers, MD [facility], [facility] 07/12/23 12:43 PM

## 2023-07-13 ENCOUNTER — Encounter: Payer: Self-pay | Admitting: Family Medicine

## 2023-07-17 ENCOUNTER — Other Ambulatory Visit: Payer: Self-pay

## 2023-07-25 ENCOUNTER — Encounter: Payer: Commercial Managed Care - PPO | Admitting: Family Medicine

## 2023-08-08 ENCOUNTER — Ambulatory Visit (INDEPENDENT_AMBULATORY_CARE_PROVIDER_SITE_OTHER): Payer: Commercial Managed Care - PPO | Admitting: Family Medicine

## 2023-08-08 ENCOUNTER — Encounter (INDEPENDENT_AMBULATORY_CARE_PROVIDER_SITE_OTHER): Payer: Self-pay | Admitting: Family Medicine

## 2023-08-08 ENCOUNTER — Other Ambulatory Visit (HOSPITAL_COMMUNITY): Payer: Self-pay

## 2023-08-08 VITALS — BP 134/84 | HR 79 | Temp 98.7°F | Ht 62.0 in | Wt 278.0 lb

## 2023-08-08 DIAGNOSIS — I152 Hypertension secondary to endocrine disorders: Secondary | ICD-10-CM | POA: Diagnosis not present

## 2023-08-08 DIAGNOSIS — E559 Vitamin D deficiency, unspecified: Secondary | ICD-10-CM

## 2023-08-08 DIAGNOSIS — Z6841 Body Mass Index (BMI) 40.0 and over, adult: Secondary | ICD-10-CM

## 2023-08-08 DIAGNOSIS — E1159 Type 2 diabetes mellitus with other circulatory complications: Secondary | ICD-10-CM | POA: Diagnosis not present

## 2023-08-08 MED ORDER — VITAMIN D (ERGOCALCIFEROL) 1.25 MG (50000 UNIT) PO CAPS
50000.0000 [IU] | ORAL_CAPSULE | ORAL | 0 refills | Status: DC
  Filled 2023-08-08: qty 4, 28d supply, fill #0

## 2023-08-08 NOTE — Progress Notes (Signed)
[name], D.O.  ABFM, ABOM Specializing in Clinical Bariatric Medicine  Office located at: [address] [place], [place]  [address]     Assessment and Plan:   Medications Discontinued During This Encounter  Medication Reason   Vitamin D, Ergocalciferol, (DRISDOL) 1.25 MG (50000 UNIT) CAPS capsule Reorder   Vitamin D, Ergocalciferol, (DRISDOL) 1.25 MG (50000 UNIT) CAPS capsule      Meds ordered this encounter  Medications   DISCONTD: Vitamin D, Ergocalciferol, (DRISDOL) 1.25 MG (50000 UNIT) CAPS capsule    Sig: Take 1 capsule (50,000 Units total) by mouth every 7 (seven) days.    Dispense:  4 capsule    Refill:  0   Vitamin D, Ergocalciferol, (DRISDOL) 1.25 MG (50000 UNIT) CAPS capsule    Sig: Take 1 capsule (50,000 Units total) by mouth every 7 (seven) days.    Dispense:  4 capsule    Refill:  0     Hypertension associated with diabetes [facility]) Assessment & Plan: BP Readings from Last 3 Encounters:  08/08/23 134/84  07/12/23 132/82  07/09/23 (!) 144/84   Hypertension treated with Amlodipine 10 mg, Toprol-XL 25 mg, & Cozaar 25 mg daily. Blood pressure is stable. No concerns in this regard today. C/w all bps controlling medications per PCP. C/w prudent nutritional plan and low sodium diet, advance exercise as tolerated. We will continue to monitor symptoms as they relate to the her weight loss journey.   Vitamin D deficiency Assessment & Plan: Lab Results  Component Value Date   VD25OH 34.1 06/25/2023   She is on ERGO 50,000 units once a week and is tolerating well. Will refill ERGO today. Will recheck levels in 2-3 months.    Morbid obesity (HCC)starting BMI 06/25/23- 51.57 BMI 50.0-59.9, adult (HCC)- current BMI  50.83 Assessment & Plan: Since last office visit on 07/09/23 patient's  Muscle mass has decreased by 1.2 lb. Fat mass has decreased by 2.4lb. Counseling done on how various foods will affect these numbers and how to maximize success  Total lbs  lost to date: 4 lbs Total weight loss percentage to date: -1.42   No change to meal plan - see Subjective  Behavioral Intervention Additional resources provided today: category 2 meal plan information, breakfast options, and lunch options Evidence-based interventions for health behavior change were utilized today including the discussion of self monitoring techniques, problem-solving barriers and SMART goal setting techniques.   Regarding patient's less desirable eating habits and patterns, we employed the technique of small changes.  Pt will specifically work on: weighing her proteins/measuring her vegetables for next visit.    FOLLOW UP: Return in about 25 days (around 09/02/2023). She was informed of the importance of frequent follow up visits to maximize her success with intensive lifestyle modifications for her multiple health conditions.  Subjective:   Chief complaint: Obesity [name] is here to discuss her progress with her obesity treatment plan. She is on the Category 2 Plan with B/L options and 100 optional snack calories and states she is following her eating plan approximately 50-75% of the time. She states she is exercising (walking and treadmill) 30-35 minutes 5 days per week.  Interval History:  [name] is here for a follow up office visit. Reports that breakfast is not a struggle for her. For lunch, she ordinarly has salads with lettuce, tomatoes, cucumbers, chicken (not measuring portion), & no dressing. She notes that dinner is a challenge. Tends to skip dinner (e.g only having water  and an apple) about 50% of the time. The other 50% of the time, she has chicken, basmati rice (0.25 cup) & vegetables (not measuring). No c/o of hunger and cravings.   Review of Systems:  Pertinent positives were addressed with patient today.  Reviewed by clinician on day of visit: allergies, medications, problem list, medical history, surgical history, family history, social history, and  previous encounter notes.  Weight Summary and Biometrics   Weight Lost Since Last Visit: 4lb  Weight Gained Since Last Visit: 0lb   Vitals Temp: 98.7 F (37.1 C) BP: 134/84 Pulse Rate: 79 SpO2: 97 %   Anthropometric Measurements Height: 5\' 2"  (1.575 m) Weight: 278 lb (126.1 kg) BMI (Calculated): 50.83 Weight at Last Visit: 282lb Weight Lost Since Last Visit: 4lb Weight Gained Since Last Visit: 0lb Starting Weight: 282lb Total Weight Loss (lbs): 4 lb (1.814 kg) Peak Weight: 287lb   Body Composition  Body Fat %: 55.7 % Fat Mass (lbs): 155 lbs Muscle Mass (lbs): 117.2 lbs Visceral Fat Rating : 21   Other Clinical Data Fasting: no Labs: no Today's Visit #: 3 Starting Date: 06/25/23    Objective:   PHYSICAL EXAM: Blood pressure 134/84, pulse 79, temperature 98.7 F (37.1 C), height 5\' 2"  (1.575 m), weight 278 lb (126.1 kg), SpO2 97%. Body mass index is 50.85 kg/m.  General: Well Developed, well nourished, and in no acute distress.  HEENT: Normocephalic, atraumatic Skin: Warm and dry, cap RF less 2 sec, good turgor Chest:  Normal excursion, shape, no gross abn Respiratory: speaking in full sentences, no conversational dyspnea NeuroM-Sk: Ambulates w/o assistance, moves * 4 Psych: A and O *3, insight good, mood-full  DIAGNOSTIC DATA REVIEWED:  BMET    Component Value Date/Time   NA 138 06/12/2023 1609   NA 141 02/29/2020 0940   K 3.9 06/12/2023 1609   CL 99 06/12/2023 1609   CO2 32 06/12/2023 1609   GLUCOSE 102 (H) 06/12/2023 1609   BUN 14 06/12/2023 1609   BUN 13 02/29/2020 0940   CREATININE 0.88 06/12/2023 1609   CREATININE 0.61 10/19/2015 1052   CALCIUM 9.6 06/12/2023 1609   GFRNONAA 90 02/29/2020 0940   GFRNONAA >89 10/19/2015 1052   GFRAA 103 02/29/2020 0940   GFRAA >89 10/19/2015 1052   Lab Results  Component Value Date   HGBA1C 6.0 06/12/2023   HGBA1C 5.6 06/23/2014   Lab Results  Component Value Date   INSULIN 19.5 06/25/2023    Lab Results  Component Value Date   TSH 3.62 06/12/2023   CBC    Component Value Date/Time   WBC 7.0 06/12/2023 1609   RBC 5.28 (H) 06/12/2023 1609   HGB 12.9 06/12/2023 1609   HGB 12.9 01/21/2019 1050   HCT 39.6 06/12/2023 1609   HCT 38.8 01/21/2019 1050   PLT 289.0 06/12/2023 1609   PLT 272 01/21/2019 1050   MCV 75.0 (L) 06/12/2023 1609   MCV 75 (L) 01/21/2019 1050   MCH 25.0 (L) 01/21/2019 1050   MCH 23.0 (L) 02/13/2016 1845   MCHC 32.6 06/12/2023 1609   RDW 14.9 06/12/2023 1609   RDW 17.2 (H) 01/21/2019 1050   Iron Studies    Component Value Date/Time   IRON 73 11/08/2017 1642   TIBC 363 01/31/2017 1725   FERRITIN 7 (L) 02/13/2016 1845   IRONPCTSAT 12 (L) 01/31/2017 1725   Lipid Panel     Component Value Date/Time   CHOL 149 06/25/2023 0906   TRIG 119 06/25/2023 0906  HDL 35 (L) 06/25/2023 0906   CHOLHDL 4 01/21/2023 1607   VLDL 30.4 01/21/2023 1607   LDLCALC 92 06/25/2023 0906   Hepatic Function Panel     Component Value Date/Time   PROT 7.1 06/25/2023 0906   ALBUMIN 4.4 06/25/2023 0906   AST 16 06/25/2023 0906   ALT 15 06/25/2023 0906   ALKPHOS 70 06/25/2023 0906   BILITOT 0.2 06/25/2023 0906   BILIDIR <0.10 06/25/2023 0906      Component Value Date/Time   TSH 3.62 06/12/2023 1609   Nutritional Lab Results  Component Value Date   VD25OH 34.1 06/25/2023    Attestations:   I, Special Puri, acting as a Stage manager for [employer], DO., have compiled all relevant documentation for today's office visit on behalf of Thomasene Lot, DO, while in the presence of [employer], DO.  I have reviewed the above documentation for accuracy and completeness, and I agree with the above. [name], D.O.  The 21st Century Cures Act was signed into law in 2016 which includes the topic of electronic health records.  This provides immediate access to information in MyChart.  This includes consultation notes, operative notes, office notes,  lab results and pathology reports.  If you have any questions about what you read please let us know at your next visit so we can discuss your concerns and take corrective action if need be.  We are right here with you.

## 2023-08-09 ENCOUNTER — Other Ambulatory Visit (HOSPITAL_COMMUNITY): Payer: Self-pay

## 2023-08-16 ENCOUNTER — Encounter: Payer: Self-pay | Admitting: Family Medicine

## 2023-08-16 ENCOUNTER — Other Ambulatory Visit (HOSPITAL_COMMUNITY): Payer: Self-pay

## 2023-08-16 ENCOUNTER — Ambulatory Visit: Payer: Commercial Managed Care - PPO | Admitting: Family Medicine

## 2023-08-16 VITALS — BP 132/76 | HR 70 | Temp 98.0°F | Wt 279.6 lb

## 2023-08-16 DIAGNOSIS — E1159 Type 2 diabetes mellitus with other circulatory complications: Secondary | ICD-10-CM | POA: Diagnosis not present

## 2023-08-16 DIAGNOSIS — R208 Other disturbances of skin sensation: Secondary | ICD-10-CM

## 2023-08-16 DIAGNOSIS — I152 Hypertension secondary to endocrine disorders: Secondary | ICD-10-CM | POA: Diagnosis not present

## 2023-08-16 LAB — BASIC METABOLIC PANEL
BUN: 13 mg/dL (ref 6–23)
CO2: 30 meq/L (ref 19–32)
Calcium: 9.3 mg/dL (ref 8.4–10.5)
Chloride: 102 meq/L (ref 96–112)
Creatinine, Ser: 0.65 mg/dL (ref 0.40–1.20)
GFR: 100.72 mL/min (ref >60.00)
Glucose, Bld: 92 mg/dL (ref 70–99)
Potassium: 4.1 meq/L (ref 3.5–5.1)
Sodium: 138 meq/L (ref 135–145)

## 2023-08-16 MED ORDER — GABAPENTIN 100 MG PO CAPS
200.0000 mg | ORAL_CAPSULE | Freq: Every day | ORAL | 5 refills | Status: DC
  Filled 2023-08-16: qty 60, 30d supply, fill #0
  Filled 2023-10-01: qty 60, 30d supply, fill #1
  Filled 2023-10-29: qty 60, 30d supply, fill #2
  Filled 2023-11-26: qty 60, 30d supply, fill #3

## 2023-08-16 NOTE — Progress Notes (Signed)
Subjective:  Patient ID: [name], female    DOB: Apr 13, 1971  Age: 52 y.o. MRN: [id number]  CC:  Chief Complaint  Patient presents with   Medical Management of Chronic Issues    HTN- Foot pain - no burning sensation daily, but does still feel it sometimes  Med sees  no difference    HPI [name] presents for  Follow-up from August 23 visit.  Hypertension: Did not tolerate higher dose of beta-blocker, returns to Toprol-XL 25 mg daily, losartan 25 mg daily added.  Continues on aspirin therapy and CAD.  Labs planned today.  No new med side effects.  Home readings: 138/86 BP Readings from Last 3 Encounters:  08/16/23 132/76  08/08/23 134/84  07/12/23 132/82   Lab Results  Component Value Date   CREATININE 0.88 06/12/2023   Burning sensation of feet Possible neuropathy when discussed at August 23 visit, intermittent symptoms, gabapentin 100 mg nightly ordered.  Still intermittent symptoms.  No change with daily gabapentin. No side effects. Ran out few days ago.  Lab Results  Component Value Date   TSH 3.62 06/12/2023   Lab Results  Component Value Date   VITAMINB12 925 (H) 06/12/2023   Diabetic Foot Exam - Simple   Simple Foot Form Visual Inspection No deformities, no ulcerations, no other skin breakdown bilaterally: Yes Sensation Testing Intact to touch and monofilament testing bilaterally: Yes Pulse Check Posterior Tibialis and Dorsalis pulse intact bilaterally: Yes Comments     History Patient Active Problem List   Diagnosis Date Noted   Diabetes mellitus without complication (HCC) 01/22/2023   Prediabetes 09/24/2022   History of anemia 09/24/2022   PONV (postoperative nausea and vomiting) 07/04/2022   Essential hypertension 07/04/2022   Anemia 07/04/2022   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   Class 3 severe obesity without serious comorbidity with body mass index (BMI) of 50.0 to 59.9 in adult [facility]) 02/15/2020   Past Medical History:   Diagnosis Date   Anemia    Difficult airway for intubation    Pt.denies updated 06/12/22   Headache(784.0)    Hypertension    Lactose intolerance    PONV (postoperative nausea and vomiting)    Pre-diabetes    Past Surgical History:  Procedure Laterality Date   COLONOSCOPY WITH PROPOFOL N/A 06/19/2022   Procedure: COLONOSCOPY WITH PROPOFOL;  Surgeon: Jenel Lucks, MD;  Location: [facility];  Service: Gastroenterology;  Laterality: N/A;   DILATION AND CURETTAGE OF UTERUS  2000   DILITATION & CURRETTAGE/HYSTROSCOPY WITH NOVASURE ABLATION N/A 02/05/2013   Procedure: DILATATION & CURETTAGE/HYSTEROSCOPY WITH NOVASURE ABLATION;  Surgeon: Meriel Pica, MD;  Location: [facility];  Service: Gynecology;  Laterality: N/A;   LAPAROSCOPIC TUBAL LIGATION Bilateral 02/05/2013   Procedure: LAPAROSCOPIC TUBAL LIGATION;  Surgeon: Meriel Pica, MD;  Location: [facility];  Service: Gynecology;  Laterality: Bilateral;  attempted tubal ligation   POLYPECTOMY  06/19/2022   Procedure: POLYPECTOMY;  Surgeon: Jenel Lucks, MD;  Location: [facility];  Service: Gastroenterology;;   Allergies  Allergen Reactions   Lactose Intolerance (Gi) Other (See Comments)    Cheese/milk products - GI upset   Prior to Admission medications   Medication Sig Start Date End Date Taking? Authorizing Provider  amLODipine (NORVASC) 10 MG tablet Take 1 tablet (10 mg total) by mouth daily. 06/12/23  Yes Shade Flood, MD  Ascorbic Acid (VITAMIN C PO) Take 1 tablet by mouth in the morning.   Yes [provider]  Biotin 09811 MCG TABS Take by mouth daily.   Yes [provider]  Blood Pressure Monitoring (BLOOD PRESSURE MONITOR 3) DEVI Use as directed. 04/09/22  Yes   gabapentin (NEURONTIN) 100 MG capsule Take 1 capsule (100 mg total) by mouth at bedtime. 07/12/23  Yes Shade Flood, MD  losartan (COZAAR) 25 MG tablet Take 1 tablet (25 mg total) by mouth daily. 07/12/23  Yes Shade Flood, MD   metoprolol succinate (TOPROL-XL) 25 MG 24 hr tablet Take 1 tablet (25 mg total) by mouth daily. Take with or immediately following a meal. 25mg  every day is correct dose. 07/12/23  Yes Shade Flood, MD  Multiple Vitamin (MULTIVITAMIN WITH MINERALS) TABS tablet Take 1 tablet by mouth in the morning. Centrum for Women 50+   Yes [provider]  Vitamin D, Ergocalciferol, (DRISDOL) 1.25 MG (50000 UNIT) CAPS capsule Take 1 capsule (50,000 Units total) by mouth every 7 (seven) days. 08/08/23  Yes Opalski, Gavin Pound, DO   Social History   Socioeconomic History   Marital status: Married    Spouse name: Not on file   Number of children: Not on file   Years of education: Not on file   Highest education level: Associate degree: occupational, Scientist, product/process development, or vocational program  Occupational History   Not on file  Tobacco Use   Smoking status: Never    Passive exposure: Never   Smokeless tobacco: Never  Vaping Use   Vaping status: Never Used  Substance and Sexual Activity   Alcohol use: Yes    Comment: occas   Drug use: No   Sexual activity: Yes  Other Topics Concern   Not on file  Social History Narrative   Not on file   Social Determinants of Health   Financial Resource Strain: Low Risk  (07/11/2023)   Overall Financial Resource Strain (CARDIA)    Difficulty of Paying Living Expenses: Not hard at all  Food Insecurity: No Food Insecurity (07/11/2023)   Hunger Vital Sign    Worried About Running Out of Food in the Last Year: Never true    Ran Out of Food in the Last Year: Never true  Transportation Needs: No Transportation Needs (07/11/2023)   PRAPARE - Administrator, Civil Service (Medical): No    Lack of Transportation (Non-Medical): No  Physical Activity: Sufficiently Active (07/11/2023)   Exercise Vital Sign    Days of Exercise per Week: 4 days    Minutes of Exercise per Session: 40 min  Stress: No Stress Concern Present (07/11/2023)   [employer] of  Occupational Health - Occupational Stress Questionnaire    Feeling of Stress : Not at all  Social Connections: Unknown (07/11/2023)   Social Connection and Isolation Panel [NHANES]    Frequency of Communication with Friends and Family: More than three times a week    Frequency of Social Gatherings with Friends and Family: Patient declined    Attends Religious Services: Patient declined    Database administrator or Organizations: Yes    Attends Engineer, structural: More than 4 times per year    Marital Status: Living with partner  Intimate Partner Violence: Not on file    Review of Systems Per HPI.   Objective:   Vitals:   08/16/23 0839  BP: 132/76  Pulse: 70  Temp: 98 F (36.7 C)  TempSrc: Temporal  SpO2: 96%  Weight: 279 lb 9.6 oz (126.8 kg)     Physical Exam Vitals reviewed.  Constitutional:      General: She is not in acute distress.    Appearance: Normal appearance. She is well-developed. She is obese.  HENT:     Head: Normocephalic and atraumatic.  Eyes:     Conjunctiva/sclera: Conjunctivae normal.     Pupils: Pupils are equal, round, and reactive to light.  Neck:     Vascular: No carotid bruit.  Cardiovascular:     Rate and Rhythm: Normal rate and regular rhythm.     Heart sounds: Normal heart sounds.  Pulmonary:     Effort: Pulmonary effort is normal.     Breath sounds: Normal breath sounds.  Abdominal:     Palpations: Abdomen is soft. There is no pulsatile mass.     Tenderness: There is no abdominal tenderness.  Musculoskeletal:     Right lower leg: No edema.     Left lower leg: No edema.  Skin:    General: Skin is warm and dry.     Comments: Plantar feet sensation intact, nontender, normal microfilament testing.  Neurological:     Mental Status: She is alert and oriented to person, place, and time.  Psychiatric:        Mood and Affect: Mood normal.        Behavior: Behavior normal.        Assessment & Plan:  CAILA CIRELLI is a 52  y.o. female . Hypertension associated with diabetes (HCC) - Plan: Basic metabolic panel  -Overall stable.  Tolerating current regimen including with lower dose of Toprol, addition of losartan.  Home monitoring and option of 50 mg losartan if persistent elevations above 130/80.  Return to prior dose of side effects at that dose, or let me know if she does take 50 mg and I can send appropriate refills to pharmacy.  Check BMP  Burning sensation of feet - Plan: gabapentin (NEURONTIN) 100 MG capsule  -Intermittent symptoms and reassuring exam.  Minimal change with gabapentin 100 mg but no side effects.  Option of 200 mg for now with RTC precautions, stop if side effects.  82-month follow-up  Meds ordered this encounter  Medications   gabapentin (NEURONTIN) 100 MG capsule    Sig: Take 2 capsules (200 mg total) by mouth at bedtime.    Dispense:  60 capsule    Refill:  5   Patient Instructions  Blood pressure is looking better today.  If you persistently have readings above 130/80 at home you can try taking 2 of the losartan 25 mg each day for a total dose of 50 mg.  Watch for lightheadedness, dizziness or new side effects on that dose and if that occurs return to just 25 mg/day.  If you do tolerate the 50 mg dose, let me know and I can change the prescription at your pharmacy.  Try taking 2 gabapentin per day to see if that helps the burning sensation in your feet.  If any side effects at that dose then stop that medication and let me know.  Thank you for coming in today and take care.     Signed,   Meredith Staggers, MD [facility], [facility] 08/16/23 9:25 AM

## 2023-08-16 NOTE — Patient Instructions (Signed)
Blood pressure is looking better today.  If you persistently have readings above 130/80 at home you can try taking 2 of the losartan 25 mg each day for a total dose of 50 mg.  Watch for lightheadedness, dizziness or new side effects on that dose and if that occurs return to just 25 mg/day.  If you do tolerate the 50 mg dose, let me know and I can change the prescription at your pharmacy.  Try taking 2 gabapentin per day to see if that helps the burning sensation in your feet.  If any side effects at that dose then stop that medication and let me know.  Thank you for coming in today and take care.

## 2023-08-19 ENCOUNTER — Other Ambulatory Visit (HOSPITAL_COMMUNITY): Payer: Self-pay

## 2023-08-29 ENCOUNTER — Ambulatory Visit (INDEPENDENT_AMBULATORY_CARE_PROVIDER_SITE_OTHER): Payer: Commercial Managed Care - PPO | Admitting: Family Medicine

## 2023-09-02 ENCOUNTER — Other Ambulatory Visit (HOSPITAL_COMMUNITY): Payer: Self-pay

## 2023-09-02 ENCOUNTER — Ambulatory Visit (INDEPENDENT_AMBULATORY_CARE_PROVIDER_SITE_OTHER): Payer: Commercial Managed Care - PPO | Admitting: Family Medicine

## 2023-09-02 ENCOUNTER — Encounter (INDEPENDENT_AMBULATORY_CARE_PROVIDER_SITE_OTHER): Payer: Self-pay | Admitting: Family Medicine

## 2023-09-02 VITALS — BP 140/84 | HR 74 | Temp 98.5°F | Ht 62.0 in | Wt 275.0 lb

## 2023-09-02 DIAGNOSIS — M25561 Pain in right knee: Secondary | ICD-10-CM

## 2023-09-02 DIAGNOSIS — E1169 Type 2 diabetes mellitus with other specified complication: Secondary | ICD-10-CM

## 2023-09-02 DIAGNOSIS — I152 Hypertension secondary to endocrine disorders: Secondary | ICD-10-CM

## 2023-09-02 DIAGNOSIS — E559 Vitamin D deficiency, unspecified: Secondary | ICD-10-CM

## 2023-09-02 DIAGNOSIS — Z6841 Body Mass Index (BMI) 40.0 and over, adult: Secondary | ICD-10-CM

## 2023-09-02 DIAGNOSIS — E1159 Type 2 diabetes mellitus with other circulatory complications: Secondary | ICD-10-CM

## 2023-09-02 MED ORDER — VITAMIN D (ERGOCALCIFEROL) 1.25 MG (50000 UNIT) PO CAPS
50000.0000 [IU] | ORAL_CAPSULE | ORAL | 0 refills | Status: DC
  Filled 2023-09-02: qty 4, 28d supply, fill #0

## 2023-09-02 NOTE — Progress Notes (Signed)
[name], D.O.  ABFM, ABOM Specializing in Clinical Bariatric Medicine  Office located at: [address] [place], [place]  [address]     Assessment and Plan:   Medications Discontinued During This Encounter  Medication Reason   Vitamin D, Ergocalciferol, (DRISDOL) 1.25 MG (50000 UNIT) CAPS capsule Reorder    Meds ordered this encounter  Medications   Vitamin D, Ergocalciferol, (DRISDOL) 1.25 MG (50000 UNIT) CAPS capsule    Sig: Take 1 capsule (50,000 Units total) by mouth every 7 (seven) days.    Dispense:  4 capsule    Refill:  0    Right knee pain, unspecified chronicity Assessment: Condition is Not optimized.. She informed me that about 2 weeks ago her right knee began to hurt. She believes this is due to over exertion.   Plan: - I advised pt to use cold and hot compress and if pain persists for a extended amount of time to follow up with her PCP.   - Informed her that weight lifting will help strength her leg muscles.    Vitamin D deficiency Assessment: Condition is Not at goal.. Pt continues with ERGO and informed me that she finished her bottle.  Lab Results  Component Value Date   VD25OH 34.1 06/25/2023   Plan: - Continue with Ergocalciferol 50K IU weekly. I will refill today.   - weight loss will likely improve availability of vitamin D, thus encouraged [name] to continue with meal plan and their weight loss efforts to further improve this condition.  Thus, we will need to monitor levels regularly (every 3-4 mo on average) to keep levels within normal limits and prevent over supplementation.   Type 2 diabetes mellitus with obesity (HCC) Assessment: This is diet/exercise controlled. She does not check her blood sugar regularly and endorses skipping meals 50% of the time, mainly dinner.  Lab Results  Component Value Date   HGBA1C 6.0 06/12/2023   HGBA1C 5.9 01/21/2023   HGBA1C 6.0 09/25/2022   INSULIN 19.5 06/25/2023    Plan: - Continue her prudent  nutritional plan that is low in simple carbohydrates, saturated fats and trans fats to goal of 5-10% weight loss to achieve significant health benefits.  Pt encouraged to continually advance exercise and cardiovascular fitness as tolerated throughout weight loss journey.   - Reminded [name] if she feels poorly- check Blood Sugar and Blood Pressure at that time.     - Hypoglycemia prevention discussed with the patient.  Eat on a regular basis- no skipping or going long periods without eating.      - Importance of f/up with PCP and all other specialists, as scheduled, was stressed to the patient today    Essential hypertension Assessment: Condition is Not at goal.. Pt BP is usually well controlled but is elevated today at 140/84. This is being treated with Cozaar, Novasc, and Toprol-XL She tolerates this well and denies any adverse effects of these medications.  Last 3 blood pressure readings in our office are as follows: BP Readings from Last 3 Encounters:  09/02/23 (!) 140/84  08/16/23 132/76  08/08/23 134/84   Plan: - Continue with Cozaar, Novasc, and Toprol-XL at current dose as directed.   - Avoid buying foods that are: processed, frozen, or prepackaged to avoid excess salt.  - Ambulatory and sedentary blood pressure monitoring encouraged.  Reminded patient that if they ever feel poorly in any way, to check their blood pressure and pulse as well. Check BP 1-2 times  a week.   - We will continue to monitor closely alongside PCP/ specialists.  Pt reminded to also f/up with those individuals as instructed by them.    TREATMENT PLAN FOR OBESITY: BMI 50.0-59.9, adult (HCC)- current BMI  50.29 Morbid obesity (HCC)starting BMI 06/25/23- 51.57 Assessment:  [name] is here to discuss her progress with her obesity treatment plan along with follow-up of her obesity related diagnoses. See Medical Weight Management Flowsheet for complete bioelectrical impedance results.  Condition is  not optimized. Biometric data collected today, was reviewed with patient.   Since last office visit on 08/08/2023 patient's  Muscle mass has decreased by 1lb. Fat mass has decreased by 1.4lb. Counseling done on how various foods will affect these numbers and how to maximize success  Total lbs lost to date: 7 Total weight loss percentage to date: 2.48%   Plan:  [name] will work on healthier eating habits and following Category 2 Plan with B/L options and 100 optional snack calories and 6-8oz of lean protein for dinner.   Behavioral Intervention Additional resources provided today: category 2 meal plan information Evidence-based interventions for health behavior change were utilized today including the discussion of self monitoring techniques, problem-solving barriers and SMART goal setting techniques.   Regarding patient's less desirable eating habits and patterns, we employed the technique of small changes.  Pt will specifically work on: Not skipping meals for next visit.     She has agreed to Think about enjoyable ways to increase daily physical activity and overcoming barriers to exercise and Unable to participate in physical activity at present due to medical conditions    FOLLOW UP: No follow-ups on file.  She was informed of the importance of frequent follow up visits to maximize her success with intensive lifestyle modifications for her multiple health conditions.  Subjective:   Chief complaint: Obesity [name] is here to discuss her progress with her obesity treatment plan. She is on the the Category 2 Plan with breakfast and lunch options and 100 snack calories and states she is following her eating plan approximately 70% of the time. She states she is not exercising due to knee pain.   Interval History:  [name] is here for a follow up office visit.     Since last office visit:  Pt notes it being a little hard following the prescribed meal plan. She states that she doesn't  eat out. Pt notes being very full when following the meal plan and skips meals about 50% of the time. She tries to drink water in replacement of meals she skips. Pt has been eating the [name] low carb bread and does not eat cheese or mayo.   We reviewed her meal plan and all questions were answered.   Review of Systems:  Pertinent positives were addressed with patient today.  Reviewed by clinician on day of visit: allergies, medications, problem list, medical history, surgical history, family history, social history, and previous encounter notes.  Weight Summary and Biometrics   Weight Lost Since Last Visit: 3lb  Weight Gained Since Last Visit: 0lb   Vitals Temp: 98.5 F (36.9 C) BP: (!) 140/84 Pulse Rate: 74 SpO2: 98 %   Anthropometric Measurements Height: 5\' 2"  (1.575 m) Weight: 275 lb (124.7 kg) BMI (Calculated): 50.29 Weight at Last Visit: 278lb Weight Lost Since Last Visit: 3lb Weight Gained Since Last Visit: 0lb Starting Weight: 282lb Total Weight Loss (lbs): 7 lb (3.175 kg) Peak Weight: 287lb   Body Composition  Body Fat %: 55.7 % Fat Mass (lbs): 153.6 lbs Muscle Mass (lbs): 116 lbs Visceral Fat Rating : 21   Other Clinical Data Fasting: no Labs: no Today's Visit #: 4 Starting Date: 06/25/23     Objective:   PHYSICAL EXAM: Blood pressure (!) 140/84, pulse 74, temperature 98.5 F (36.9 C), height 5\' 2"  (1.575 m), weight 275 lb (124.7 kg), SpO2 98%. Body mass index is 50.3 kg/m.  General: Well Developed, well nourished, and in no acute distress.  HEENT: Normocephalic, atraumatic Skin: Warm and dry, cap RF less 2 sec, good turgor Chest:  Normal excursion, shape, no gross abn Respiratory: speaking in full sentences, no conversational dyspnea NeuroM-Sk: Ambulates w/o assistance, moves * 4 Psych: A and O *3, insight good, mood-full  DIAGNOSTIC DATA REVIEWED:  BMET    Component Value Date/Time   NA 138 08/16/2023 0925   NA 141 02/29/2020 0940    K 4.1 08/16/2023 0925   CL 102 08/16/2023 0925   CO2 30 08/16/2023 0925   GLUCOSE 92 08/16/2023 0925   BUN 13 08/16/2023 0925   BUN 13 02/29/2020 0940   CREATININE 0.65 08/16/2023 0925   CREATININE 0.61 10/19/2015 1052   CALCIUM 9.3 08/16/2023 0925   GFRNONAA 90 02/29/2020 0940   GFRNONAA >89 10/19/2015 1052   GFRAA 103 02/29/2020 0940   GFRAA >89 10/19/2015 1052   Lab Results  Component Value Date   HGBA1C 6.0 06/12/2023   HGBA1C 5.6 06/23/2014   Lab Results  Component Value Date   INSULIN 19.5 06/25/2023   Lab Results  Component Value Date   TSH 3.62 06/12/2023   CBC    Component Value Date/Time   WBC 7.0 06/12/2023 1609   RBC 5.28 (H) 06/12/2023 1609   HGB 12.9 06/12/2023 1609   HGB 12.9 01/21/2019 1050   HCT 39.6 06/12/2023 1609   HCT 38.8 01/21/2019 1050   PLT 289.0 06/12/2023 1609   PLT 272 01/21/2019 1050   MCV 75.0 (L) 06/12/2023 1609   MCV 75 (L) 01/21/2019 1050   MCH 25.0 (L) 01/21/2019 1050   MCH 23.0 (L) 02/13/2016 1845   MCHC 32.6 06/12/2023 1609   RDW 14.9 06/12/2023 1609   RDW 17.2 (H) 01/21/2019 1050   Iron Studies    Component Value Date/Time   IRON 73 11/08/2017 1642   TIBC 363 01/31/2017 1725   FERRITIN 7 (L) 02/13/2016 1845   IRONPCTSAT 12 (L) 01/31/2017 1725   Lipid Panel     Component Value Date/Time   CHOL 149 06/25/2023 0906   TRIG 119 06/25/2023 0906   HDL 35 (L) 06/25/2023 0906   CHOLHDL 4 01/21/2023 1607   VLDL 30.4 01/21/2023 1607   LDLCALC 92 06/25/2023 0906   Hepatic Function Panel     Component Value Date/Time   PROT 7.1 06/25/2023 0906   ALBUMIN 4.4 06/25/2023 0906   AST 16 06/25/2023 0906   ALT 15 06/25/2023 0906   ALKPHOS 70 06/25/2023 0906   BILITOT 0.2 06/25/2023 0906   BILIDIR <0.10 06/25/2023 0906      Component Value Date/Time   TSH 3.62 06/12/2023 1609   Nutritional Lab Results  Component Value Date   VD25OH 34.1 06/25/2023    Attestations:   I, Clinical biochemist, acting as a Cytogeneticist for [employer], DO., have compiled all relevant documentation for today's office visit on behalf of Thomasene Lot, DO, while in the presence of [employer], DO.  I have reviewed the above documentation for accuracy  and completeness, and I agree with the above. [name], D.O.  The 21st Century Cures Act was signed into law in 2016 which includes the topic of electronic health records.  This provides immediate access to information in MyChart.  This includes consultation notes, operative notes, office notes, lab results and pathology reports.  If you have any questions about what you read please let us know at your next visit so we can discuss your concerns and take corrective action if need be.  We are right here with you.

## 2023-09-03 ENCOUNTER — Other Ambulatory Visit (HOSPITAL_COMMUNITY): Payer: Self-pay

## 2023-09-08 ENCOUNTER — Other Ambulatory Visit (HOSPITAL_COMMUNITY): Payer: Self-pay

## 2023-09-08 ENCOUNTER — Encounter (HOSPITAL_COMMUNITY): Payer: Self-pay | Admitting: Pharmacist

## 2023-09-09 ENCOUNTER — Other Ambulatory Visit (HOSPITAL_COMMUNITY): Payer: Self-pay

## 2023-09-09 ENCOUNTER — Other Ambulatory Visit: Payer: Self-pay | Admitting: Family Medicine

## 2023-09-09 DIAGNOSIS — I10 Essential (primary) hypertension: Secondary | ICD-10-CM

## 2023-09-10 ENCOUNTER — Other Ambulatory Visit (HOSPITAL_COMMUNITY): Payer: Self-pay

## 2023-09-10 ENCOUNTER — Other Ambulatory Visit: Payer: Self-pay

## 2023-09-10 MED ORDER — LOSARTAN POTASSIUM 25 MG PO TABS
25.0000 mg | ORAL_TABLET | Freq: Every day | ORAL | 1 refills | Status: DC
  Filled 2023-09-10 – 2023-09-11 (×3): qty 90, 45d supply, fill #0
  Filled 2023-10-24: qty 90, 45d supply, fill #1

## 2023-09-10 NOTE — Telephone Encounter (Signed)
Refill ordered for 1 to 2 pills/day but please call and check how often she is using the 50 mg losartan dose.  If that is fairly consistently I would just recommend switching over to the 50 mg for next refill.

## 2023-09-11 ENCOUNTER — Other Ambulatory Visit (HOSPITAL_COMMUNITY): Payer: Self-pay

## 2023-09-28 IMAGING — MR MR PELVIS WO/W CM
9 of 13 series · 31 of 48 positions shown · IV contrast (gadavist)
Comparison: None.

CLINICAL DATA: Postmenopausal bleeding. Previous endometrial
ablation.

EXAM:
MRI PELVIS WITHOUT AND WITH CONTRAST
TECHNIQUE: Multiplanar multisequence MR imaging of the pelvis was performed
both before and after administration of intravenous contrast.
CONTRAST:  10mL GADAVIST GADOBUTROL 1 MMOL/ML IV SOLN

[Series 3: T2 · coronal · 5.0mm · 1.41mm/px · 2 of 46 slices shown]
[im 1/46]
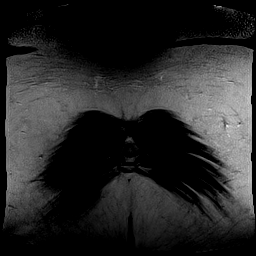
[im 46/46]
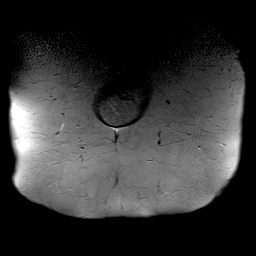

[Series 4: ax tse trig · axial · 5.0mm · 0.81mm/px · z∈[+21,+255]mm · 2 of 40 slices shown]
[im 1/40]
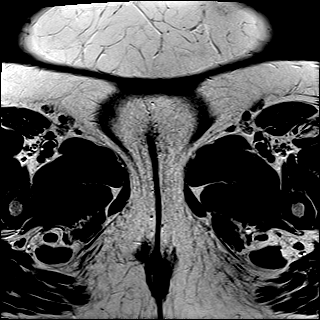
[im 40/40]
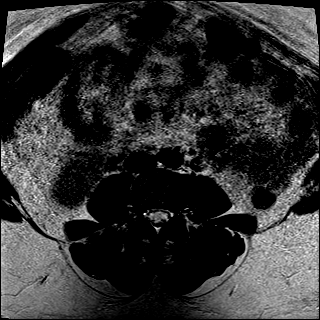

[Series 5: ax tse fs · axial · 5.0mm · 0.81mm/px · z∈[+21,+255]mm · 2 of 40 slices shown]
[im 1/40]
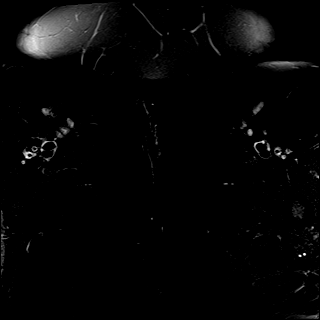
[im 40/40]
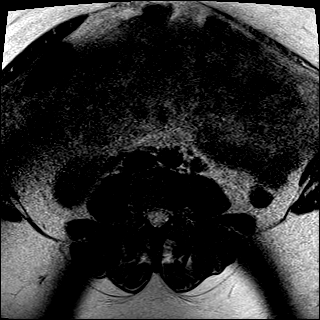

[Series 6: sag tse trig · sagittal · 5.0mm · 0.81mm/px · 1 of 34 slices shown]
[im 1/34]
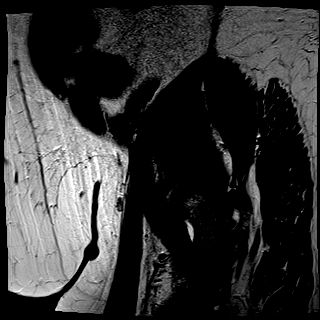

[Series 7: T1 dynamic fat-sat · axial · 1.4mm · 0.78mm/px · z∈[-10,+258]mm · 8 of 192 slices shown (1 of 2)]
[im 1/192]
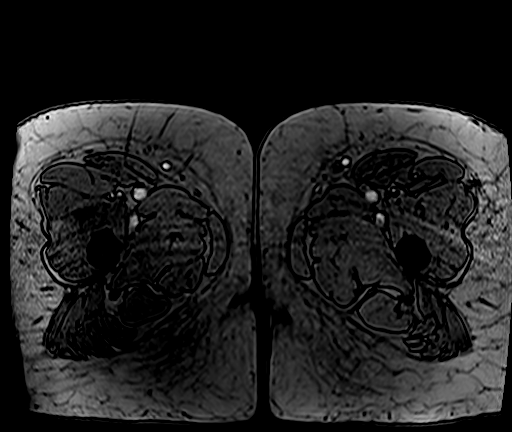
[im 28/192]
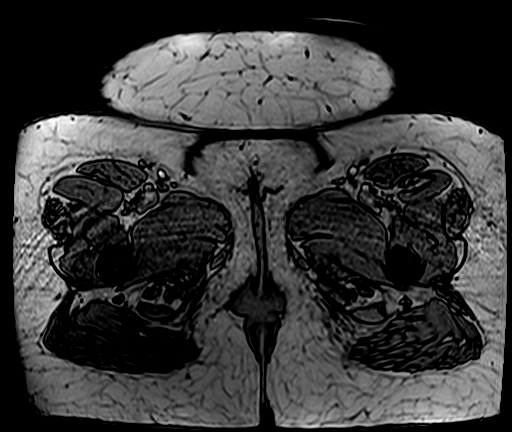
[im 55/192]
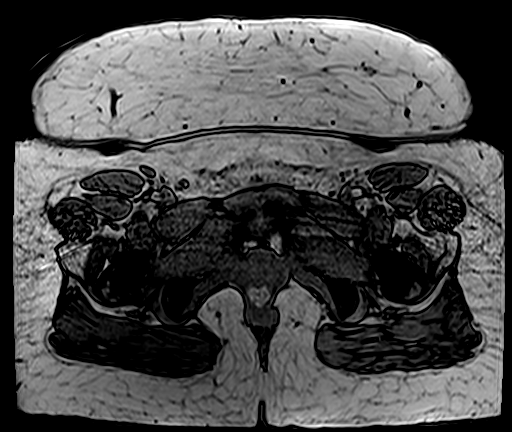
[im 82/192]
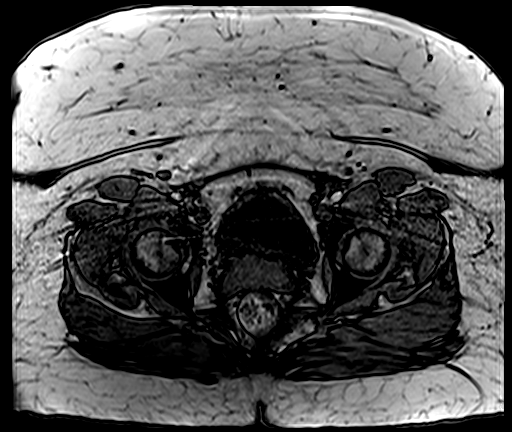
[im 110/192]
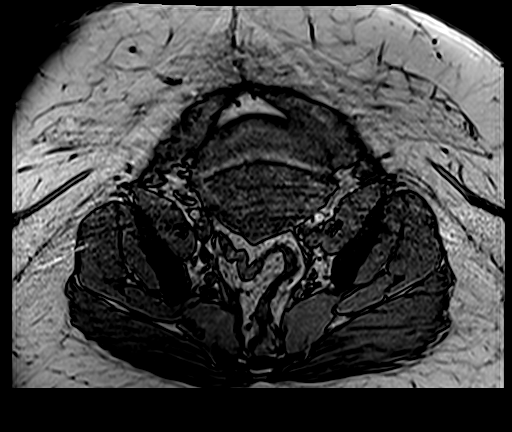
[im 137/192]
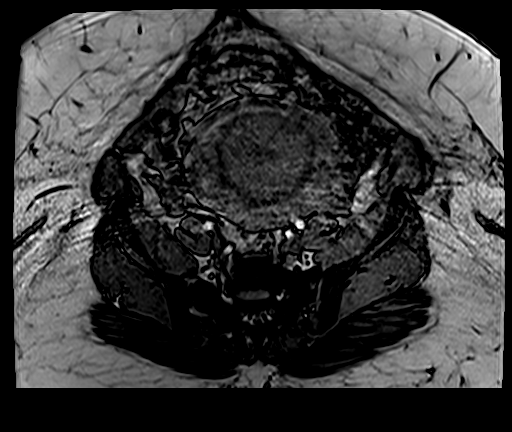
[im 164/192]
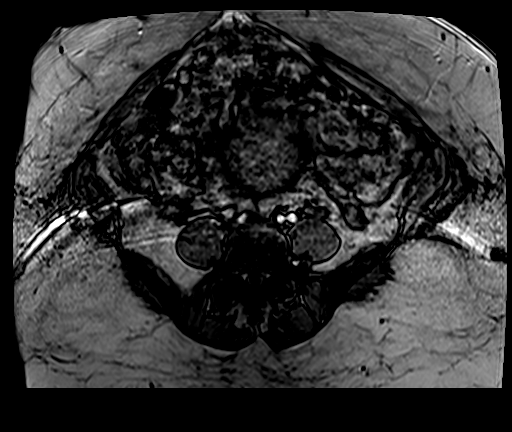
[im 192/192]
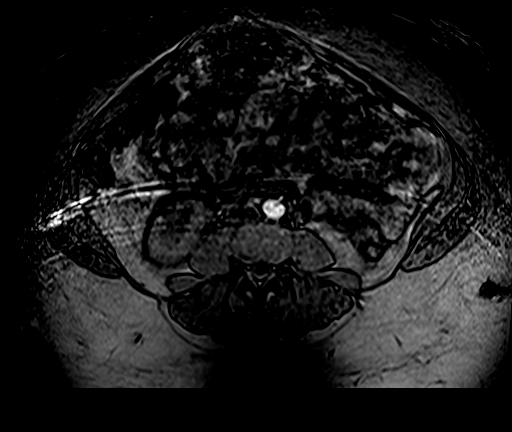

[Series 7: T1 dynamic fat-sat · axial · 1.4mm · 0.78mm/px · z∈[-10,+258]mm · 8 of 192 slices shown (2 of 2)]
[im 1/192]
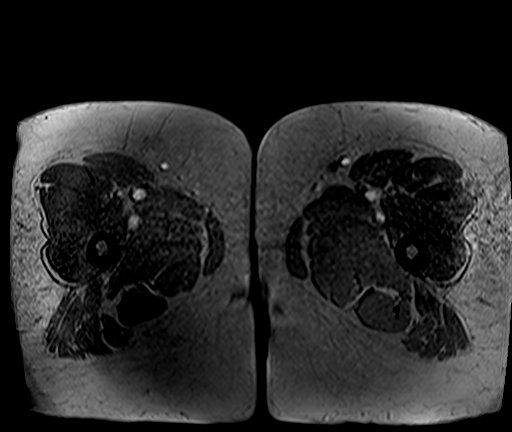
[im 28/192]
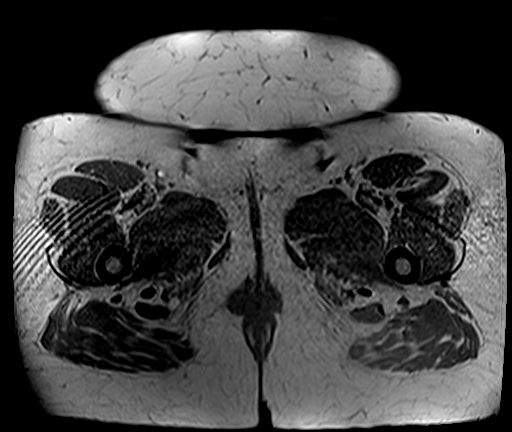
[im 55/192]
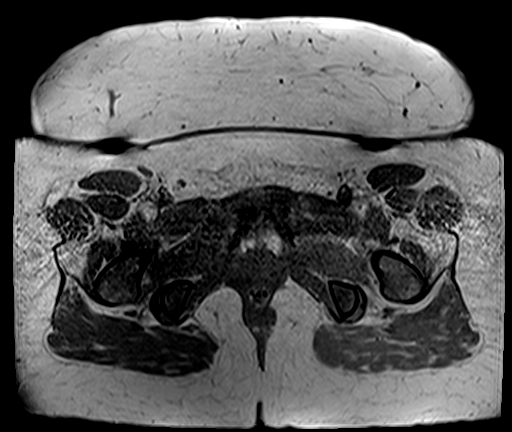
[im 82/192]
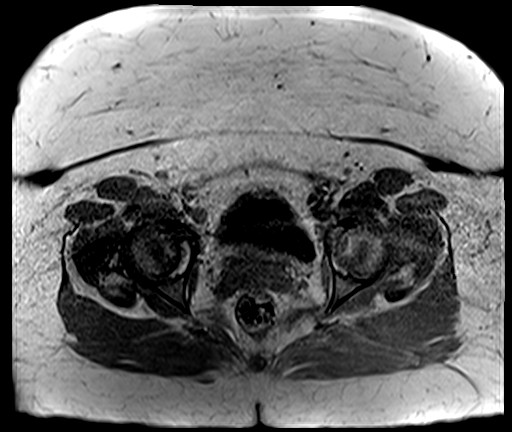
[im 110/192]
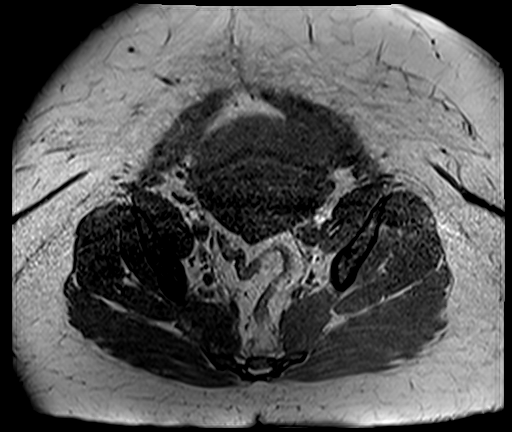
[im 137/192]
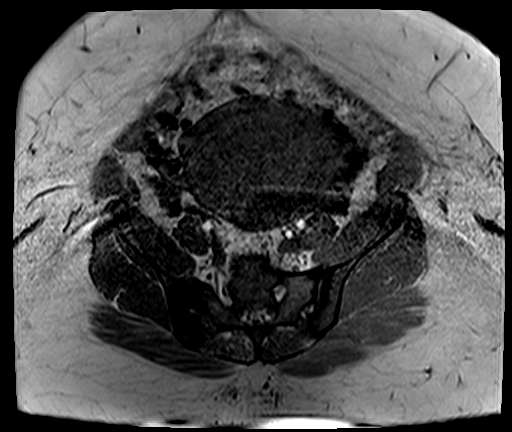
[im 164/192]
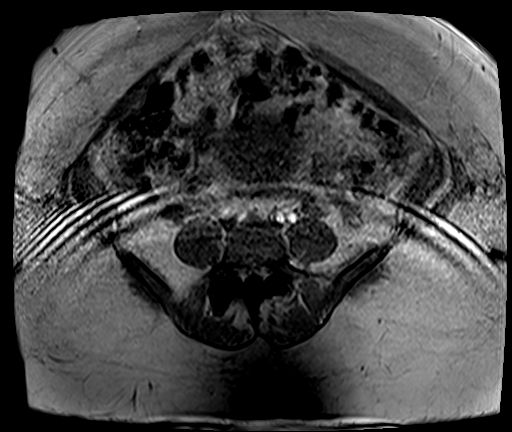
[im 192/192]
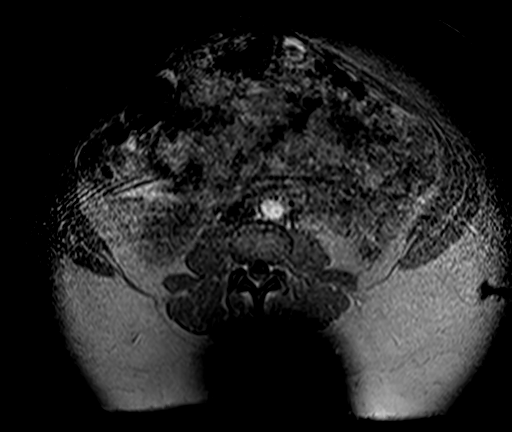

[Series 9: ax in and · axial · 4.0mm · 0.94mm/px · z∈[-4,+280]mm · 3 of 72 slices shown (1 of 2)]
[im 1/72]
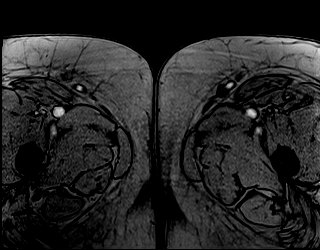
[im 36/72]
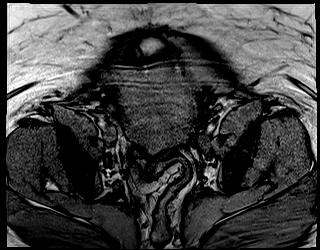
[im 72/72]
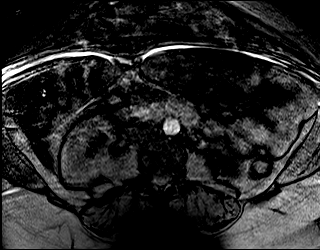

[Series 9: ax in and · axial · 4.0mm · 0.94mm/px · z∈[-4,+280]mm · 3 of 72 slices shown (2 of 2)]
[im 1/72]
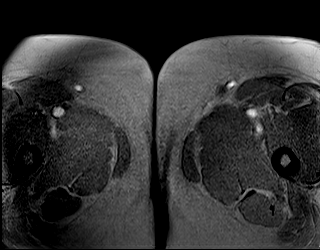
[im 36/72]
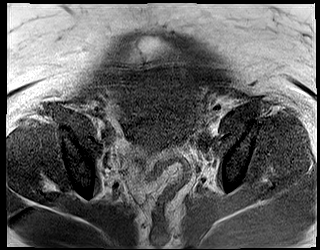
[im 72/72]
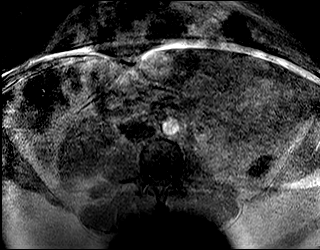

[Series 14: t1_vibe_fs_[id number]_bh_pre · axial · 3.0mm · 0.94mm/px · z∈[+12,+99]mm · 2 of 88 slices shown]
[im 1/88]
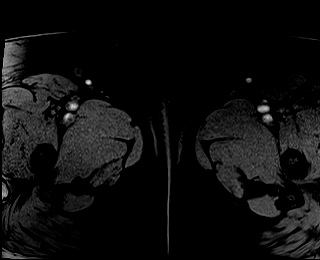
[im 30/88]
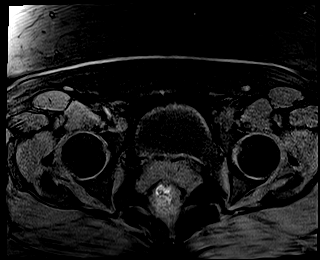

[31 of 48 positions shown; findings below may reference images not displayed]

FINDINGS: Lower Urinary Tract: No urinary bladder or urethral abnormality
identified.

Bowel: Unremarkable pelvic bowel loops.

Vascular/Lymphatic: Unremarkable. No pathologically enlarged pelvic
lymph nodes identified.

Reproductive:

-- Uterus: Measures 17.4 x 10.1 by 12.1 cm (volume = 6661 cm^3).
Diffuse T2 hypointensity of the uterine myometrium is seen. No
discrete uterine fibroids are seen. An ill-defined area of
heterogeneous signal intensity and contrast enhancement is seen in
the left lateral uterine corpus which contains tiny cystic foci.
This is difficult to measure due to poorly defined margins, but is
approximately 5 cm, and is suspicious for a focal adenomyoma. Thin
endometrium is seen measuring 2 mm. Cervix and vagina are
unremarkable.

-- Right ovary: 2.0 cm simple ovarian cyst noted.

-- Left ovary: Normal postmenopausal appearance. No ovarian or
adnexal masses identified.

Other: No peritoneal thickening or abnormal free fluid.

Musculoskeletal:  Unremarkable.
IMPRESSION: Markedly enlarged uterus. No fibroids identified. Probable focal
adenomyoma in the left lateral uterine corpus.

2.0 cm benign-appearing right ovarian cyst. No follow-up imaging
recommended. Note: This recommendation does not apply to
premenarchal patients and to those with increased risk (genetic,
family history, elevated tumor markers or other high-risk factors)
of ovarian cancer. Reference: JACR [DATE]):[id number]

## 2023-09-30 ENCOUNTER — Encounter (INDEPENDENT_AMBULATORY_CARE_PROVIDER_SITE_OTHER): Payer: Self-pay | Admitting: Family Medicine

## 2023-09-30 ENCOUNTER — Ambulatory Visit (INDEPENDENT_AMBULATORY_CARE_PROVIDER_SITE_OTHER): Payer: Commercial Managed Care - PPO | Admitting: Family Medicine

## 2023-09-30 ENCOUNTER — Other Ambulatory Visit (HOSPITAL_COMMUNITY): Payer: Self-pay

## 2023-09-30 VITALS — BP 129/83 | HR 91 | Temp 98.3°F | Ht 62.0 in | Wt 272.0 lb

## 2023-09-30 DIAGNOSIS — Z6841 Body Mass Index (BMI) 40.0 and over, adult: Secondary | ICD-10-CM

## 2023-09-30 DIAGNOSIS — E1169 Type 2 diabetes mellitus with other specified complication: Secondary | ICD-10-CM

## 2023-09-30 DIAGNOSIS — E559 Vitamin D deficiency, unspecified: Secondary | ICD-10-CM

## 2023-09-30 DIAGNOSIS — I152 Hypertension secondary to endocrine disorders: Secondary | ICD-10-CM

## 2023-09-30 DIAGNOSIS — M25561 Pain in right knee: Secondary | ICD-10-CM

## 2023-09-30 DIAGNOSIS — E1159 Type 2 diabetes mellitus with other circulatory complications: Secondary | ICD-10-CM

## 2023-09-30 MED ORDER — VITAMIN D (ERGOCALCIFEROL) 1.25 MG (50000 UNIT) PO CAPS
50000.0000 [IU] | ORAL_CAPSULE | ORAL | 0 refills | Status: DC
  Filled 2023-09-30: qty 4, 28d supply, fill #0

## 2023-09-30 NOTE — Progress Notes (Signed)
[name], D.O.  ABFM, ABOM Specializing in Clinical Bariatric Medicine  Office located at: [address] [place], [place]  [address]     Assessment and Plan:   Medications Discontinued During This Encounter  Medication Reason   Vitamin D, Ergocalciferol, (DRISDOL) 1.25 MG (50000 UNIT) CAPS capsule Reorder   Vitamin D, Ergocalciferol, (DRISDOL) 1.25 MG (50000 UNIT) CAPS capsule     Meds ordered this encounter  Medications   DISCONTD: Vitamin D, Ergocalciferol, (DRISDOL) 1.25 MG (50000 UNIT) CAPS capsule    Sig: Take 1 capsule (50,000 Units total) by mouth every 7 (seven) days.    Dispense:  4 capsule    Refill:  0   Vitamin D, Ergocalciferol, (DRISDOL) 1.25 MG (50000 UNIT) CAPS capsule    Sig: Take 1 capsule (50,000 Units total) by mouth every 7 (seven) days.    Dispense:  4 capsule    Refill:  0    Type 2 diabetes mellitus with obesity (HCC) Assessment: Condition is Not optimized.. This is diet/exercise controlled. She informed me that she no longer skips meals and still occasionally checks her BP. She endorses still having some hunger and cravings.  Lab Results  Component Value Date   HGBA1C 6.0 06/12/2023   HGBA1C 5.9 01/21/2023   HGBA1C 6.0 09/25/2022   INSULIN 19.5 06/25/2023    Plan: - Torilynn will continue to work on weight loss, exercise, via their meal plan we devised to help decrease the risk of progressing health problems.  - Hypoglycemia prevention discussed with the patient.  Eat on a regular basis- no skipping or going long periods without eating.      - Recheck labs in 3 months if not done at Endo provider / PCP. Importance of f/up with PCP and all other specialists, as scheduled, was stressed to the patient today    Right knee pain, unspecified chronicity Assessment: Condition is Improving, but not optimized. Pt informed me that she still has pain although improved. She has been trying to elevate it and ice it as recommended last OV.   Plan: -  Continue to use cold compress and use low impact movement.   - I advised pt to avoid hot compresses.     Vitamin D deficiency Assessment: Condition is Not optimized.Marland Kitchen Pt vitamin D level is below the optimal range of 50-80. Patient reports good compliance and tolerance of taking Ergocalciferol 50K IU. She denies any adverse side effects on this medication at this time.  Lab Results  Component Value Date   VD25OH 34.1 06/25/2023   Plan: - Continue Ergocalciferol 50K IU weekly. I will refill today.   - I discussed the importance of vitamin D to the patient's health and well-being as well as to their ability to lose weight.   - she was encouraged to continue to take the medicine until told otherwise.     - We will continue to monitor levels regularly to keep levels within normal limits and prevent over supplementation.   Hypertension associated with diabetes (HCC) Assessment: Condition is At goal.. Pt BP today is within optimal level. She has been occasionally monitoring her BP at home and averages 143/82-136/69. This is being controlled with Cozaar, Novasc, and Toprol-XL.  Last 3 blood pressure readings in our office are as follows: BP Readings from Last 3 Encounters:  09/30/23 129/83  09/02/23 (!) 140/84  08/16/23 132/76    Plan: - Continue with This is being treated with Cozaar, Novasc, and Toprol-XL as directed.   -  Carlan Holley Heggs BP is 19/83 at goal today.   Natrice Manifold will continue to follow our low salt, heart healthy meal plan and engaging in a regular exercise program discussed   - She is to avoid buying and consuming foods with excess salt.   - Continue ambulatory and sedentary blood pressure monitoring encouraged.    - We will continue to monitor closely alongside PCP/ specialists.  Pt reminded to also f/up with those individuals as instructed by them.    TREATMENT PLAN FOR OBESITY: BMI 50.0-59.9, adult (HCC)- current BMI  49.74 Morbid obesity (HCC)starting BMI 06/25/23-  51.57 Assessment:  HASEL HRUZA is here to discuss her progress with her obesity treatment plan along with follow-up of her obesity related diagnoses. See Medical Weight Management Flowsheet for complete bioelectrical impedance results.  Condition is not optimized. Biometric data collected today, was reviewed with patient.   Since last office visit on 09/02/2023 patient's  Muscle mass has decreased by 1lb. Fat mass has decreased by 2lb. Counseling done on how various foods will affect these numbers and how to maximize success  Total lbs lost to date: 10lbs Total weight loss percentage to date: 3.55%  Plan: - Kennah will continue to adhere to the Category 2 Plan with B/L options and 100 optional snack calories and 6-8oz of lean protein for dinner   - I advised Dorris to measure and limit her starchy veggies such as plantain, carrots, and beans.  Behavioral Intervention Additional resources provided today: category 2 meal plan information Evidence-based interventions for health behavior change were utilized today including the discussion of self monitoring techniques, problem-solving barriers and SMART goal setting techniques.   Regarding patient's less desirable eating habits and patterns, we employed the technique of small changes.  Pt will specifically work on: eat more protein while watching her fruits and veggie intake for next visit.     She has agreed to Unable to participate in physical activity at present due to medical conditions    FOLLOW UP: Return in about 4 weeks (around 10/28/2023).  She was informed of the importance of frequent follow up visits to maximize her success with intensive lifestyle modifications for her multiple health conditions.  Subjective:   Chief complaint: Obesity Reva is here to discuss her progress with her obesity treatment plan. She is on the Category 2 Plan with B/L options and 100 optional snack calories and 6-8oz of lean protein for dinner and states  she is following her eating plan approximately 70% of the time. She states she is not exercising.  Interval History:  HERMELINDA REGAS is here for a follow up office visit.     Since last office visit:  Since last OV she has been well. She informed me that she tried a high protein cereal and did not like it as she thought it tasted like cardboard. She did not do any weight lifting since last OV. She is no longer skipping meals and tries to get in 6-8 oz of lean protein. She will have a high protein omelette for breakfast.   We reviewed her meal plan and all questions were answered.   Review of Systems:  Pertinent positives were addressed with patient today.  Reviewed by clinician on day of visit: allergies, medications, problem list, medical history, surgical history, family history, social history, and previous encounter notes.  Weight Summary and Biometrics   Weight Lost Since Last Visit: 3lb  Weight Gained Since Last Visit: 0lb   Vitals Temp:  98.3 F (36.8 C) BP: 129/83 Pulse Rate: 91 SpO2: 98 %   Anthropometric Measurements Height: 5\' 2"  (1.575 m) Weight: 272 lb (123.4 kg) BMI (Calculated): 49.74 Weight at Last Visit: 275lb Weight Lost Since Last Visit: 3lb Weight Gained Since Last Visit: 0lb Starting Weight: 282lb Total Weight Loss (lbs): 10 lb (4.536 kg) Peak Weight: 287lb   Body Composition  Body Fat %: 55.6 % Fat Mass (lbs): 151.6 lbs Muscle Mass (lbs): 115 lbs Visceral Fat Rating : 21   Other Clinical Data Fasting: no Labs: no Today's Visit #: 5 Starting Date: 06/25/23     Objective:   PHYSICAL EXAM: Blood pressure 129/83, pulse 91, temperature 98.3 F (36.8 C), height 5\' 2"  (1.575 m), weight 272 lb (123.4 kg), SpO2 98%. Body mass index is 49.75 kg/m.  General: Well Developed, well nourished, and in no acute distress.  HEENT: Normocephalic, atraumatic Skin: Warm and dry, cap RF less 2 sec, good turgor Chest:  Normal excursion, shape, no gross  abn Respiratory: speaking in full sentences, no conversational dyspnea NeuroM-Sk: Ambulates w/o assistance, moves * 4 Psych: A and O *3, insight good, mood-full  DIAGNOSTIC DATA REVIEWED:  BMET    Component Value Date/Time   NA 138 08/16/2023 0925   NA 141 02/29/2020 0940   K 4.1 08/16/2023 0925   CL 102 08/16/2023 0925   CO2 30 08/16/2023 0925   GLUCOSE 92 08/16/2023 0925   BUN 13 08/16/2023 0925   BUN 13 02/29/2020 0940   CREATININE 0.65 08/16/2023 0925   CREATININE 0.61 10/19/2015 1052   CALCIUM 9.3 08/16/2023 0925   GFRNONAA 90 02/29/2020 0940   GFRNONAA >89 10/19/2015 1052   GFRAA 103 02/29/2020 0940   GFRAA >89 10/19/2015 1052   Lab Results  Component Value Date   HGBA1C 6.0 06/12/2023   HGBA1C 5.6 06/23/2014   Lab Results  Component Value Date   INSULIN 19.5 06/25/2023   Lab Results  Component Value Date   TSH 3.62 06/12/2023   CBC    Component Value Date/Time   WBC 7.0 06/12/2023 1609   RBC 5.28 (H) 06/12/2023 1609   HGB 12.9 06/12/2023 1609   HGB 12.9 01/21/2019 1050   HCT 39.6 06/12/2023 1609   HCT 38.8 01/21/2019 1050   PLT 289.0 06/12/2023 1609   PLT 272 01/21/2019 1050   MCV 75.0 (L) 06/12/2023 1609   MCV 75 (L) 01/21/2019 1050   MCH 25.0 (L) 01/21/2019 1050   MCH 23.0 (L) 02/13/2016 1845   MCHC 32.6 06/12/2023 1609   RDW 14.9 06/12/2023 1609   RDW 17.2 (H) 01/21/2019 1050   Iron Studies    Component Value Date/Time   IRON 73 11/08/2017 1642   TIBC 363 01/31/2017 1725   FERRITIN 7 (L) 02/13/2016 1845   IRONPCTSAT 12 (L) 01/31/2017 1725   Lipid Panel     Component Value Date/Time   CHOL 149 06/25/2023 0906   TRIG 119 06/25/2023 0906   HDL 35 (L) 06/25/2023 0906   CHOLHDL 4 01/21/2023 1607   VLDL 30.4 01/21/2023 1607   LDLCALC 92 06/25/2023 0906   Hepatic Function Panel     Component Value Date/Time   PROT 7.1 06/25/2023 0906   ALBUMIN 4.4 06/25/2023 0906   AST 16 06/25/2023 0906   ALT 15 06/25/2023 0906   ALKPHOS 70  06/25/2023 0906   BILITOT 0.2 06/25/2023 0906   BILIDIR <0.10 06/25/2023 0906      Component Value Date/Time   TSH 3.62 06/12/2023 1609  Nutritional Lab Results  Component Value Date   VD25OH 34.1 06/25/2023    Attestations:   This encounter took 46 total minutes of time including any pre-visit and post-visit time spent on this date of service, including taking a thorough history, reviewing any labs and/or imaging, reviewing prior notes, as well as documenting in the electronic health record on the date of service. Over 50% of that time was in direct face-to-face counseling and coordinating care for the patient today  I, Clinical biochemist, acting as a Stage manager for [employer], DO., have compiled all relevant documentation for today's office visit on behalf of Thomasene Lot, DO, while in the presence of [employer], DO.  I have reviewed the above documentation for accuracy and completeness, and I agree with the above. [name], D.O.  The 21st Century Cures Act was signed into law in 2016 which includes the topic of electronic health records.  This provides immediate access to information in MyChart.  This includes consultation notes, operative notes, office notes, lab results and pathology reports.  If you have any questions about what you read please let us know at your next visit so we can discuss your concerns and take corrective action if need be.  We are right here with you.

## 2023-10-01 ENCOUNTER — Other Ambulatory Visit (HOSPITAL_COMMUNITY): Payer: Self-pay

## 2023-10-09 ENCOUNTER — Other Ambulatory Visit (HOSPITAL_COMMUNITY): Payer: Self-pay

## 2023-10-09 ENCOUNTER — Other Ambulatory Visit: Payer: Self-pay | Admitting: Family Medicine

## 2023-10-09 DIAGNOSIS — I10 Essential (primary) hypertension: Secondary | ICD-10-CM

## 2023-10-09 MED ORDER — METOPROLOL SUCCINATE ER 25 MG PO TB24
25.0000 mg | ORAL_TABLET | Freq: Every day | ORAL | 1 refills | Status: DC
  Filled 2023-10-09 – 2023-10-10 (×2): qty 90, 90d supply, fill #0

## 2023-10-10 ENCOUNTER — Other Ambulatory Visit (HOSPITAL_COMMUNITY): Payer: Self-pay

## 2023-10-12 ENCOUNTER — Other Ambulatory Visit (HOSPITAL_COMMUNITY): Payer: Self-pay

## 2023-10-25 ENCOUNTER — Other Ambulatory Visit (HOSPITAL_COMMUNITY): Payer: Self-pay

## 2023-10-28 ENCOUNTER — Encounter (INDEPENDENT_AMBULATORY_CARE_PROVIDER_SITE_OTHER): Payer: Self-pay | Admitting: Family Medicine

## 2023-10-28 ENCOUNTER — Other Ambulatory Visit (HOSPITAL_COMMUNITY): Payer: Self-pay

## 2023-10-28 ENCOUNTER — Ambulatory Visit (INDEPENDENT_AMBULATORY_CARE_PROVIDER_SITE_OTHER): Payer: Commercial Managed Care - PPO | Admitting: Family Medicine

## 2023-10-28 VITALS — BP 130/84 | HR 95 | Temp 97.7°F | Ht 62.0 in | Wt 274.0 lb

## 2023-10-28 DIAGNOSIS — E559 Vitamin D deficiency, unspecified: Secondary | ICD-10-CM

## 2023-10-28 DIAGNOSIS — E1169 Type 2 diabetes mellitus with other specified complication: Secondary | ICD-10-CM

## 2023-10-28 DIAGNOSIS — Z6841 Body Mass Index (BMI) 40.0 and over, adult: Secondary | ICD-10-CM

## 2023-10-28 DIAGNOSIS — E669 Obesity, unspecified: Secondary | ICD-10-CM | POA: Diagnosis not present

## 2023-10-28 MED ORDER — VITAMIN D (ERGOCALCIFEROL) 1.25 MG (50000 UNIT) PO CAPS
50000.0000 [IU] | ORAL_CAPSULE | ORAL | 0 refills | Status: DC
  Filled 2023-10-28: qty 4, 28d supply, fill #0

## 2023-10-28 NOTE — Progress Notes (Incomplete)
[name], D.O.  ABFM, ABOM Clinical Bariatric Medicine Physician  Office located at: [address] [place], [place]  [address]     Assessment and Plan:   FOR THE DISEASE OF OBESITY: BMI 50.0-59.9, adult (HCC)- current BMI 50.1 Morbid obesity (HCC)starting BMI 06/25/23- 51.57 Assessment & Plan: Since last office visit on 09/30/23 patient's muscle mass has decreased by 1.2lb. Fat mass has increased by 2.8lb. Counseling done on how various foods will affect these numbers and how to maximize success  Total lbs lost to date: 8 lbs Total weight loss percentage to date: -2.84 %   Recommended Dietary Goals [name] is currently in the action stage of change. As such, her goal is to continue weight management plan. She has agreed to: continue current plan  Hunger and cravings are well controlled.   Behavioral Intervention We discussed the following Behavioral Modification Strategies today: avoiding skipping meals, work on meal planning and preparation, keeping healthy foods at home, practice mindfulness eating and understand the difference between hunger signals and cravings, planning for success, and celebration eating strategies  Additional resources provided today: Handout on traveling and holiday eating strategies  Evidence-based interventions for health behavior change were utilized today including the discussion of self monitoring techniques, problem-solving barriers and SMART goal setting techniques.   Regarding patient's less desirable eating habits and patterns, we employed the technique of small changes.   Pt will specifically work on: meal prepping twice a week and going to the gym for 1 hour for 2-3 days a week for next visit.   Recommended Physical Activity Goals [name] has been advised to work up to 150 minutes of moderate intensity aerobic activity a week and strengthening exercises 2-3 times per week for cardiovascular health, weight loss maintenance and  preservation of muscle mass.   She has agreed to :  Increase physical activity in their day and reduce sedentary time (increase NEAT). and Start strengthening exercises with a goal of 2-3 sessions a week   Pharmacotherapy We discussed various medication options to help [name] with her weight loss efforts and we both agreed to: continue with nutritional and behavioral strategies, given information and education on FDA approved anti-obesity medications, and will revisit options at next visit after the holidays.    FOR ASSOCIATED CONDITIONS ADDRESSED TODAY: Type 2 diabetes mellitus with obesity [name] [facility]) Assessment & Plan: Lab Results  Component Value Date   HGBA1C 5.7 (H) 10/28/2023   HGBA1C 6.0 06/12/2023   HGBA1C 5.9 01/21/2023   INSULIN 19.5 06/25/2023    Not managing her condition with any medications currently. Pt not checking her sugars at home. Hunger and cravings are well controlled. She reports she has avoided skipping meals though she at times eats off plan due to difficulty with meal prepping.   Extensive counseling given to patient on meal prepping/planning and a strategies for successful eating habits over the holidays. We will consider adding possibly a diabetic medication that can aid in weight loss at her next office visit after the holidays. We agreed to hold off on starting medication until patient is able to consistently eat on plan. Reviewed possible side effects of taking diabetic medication while not eating on plan. Patient verbalized understanding and agreed with this plan. Will check her A1C today and will review at her next office visit.   Orders: -     Hemoglobin A1c   Vitamin D deficiency Assessment & Plan: Lab Results  Component Value Date   VD25OH 51.4 10/28/2023  VD25OH 34.1 06/25/2023   Latest vitamin D was below goal at 34.1 as of 06/25/23. Pt taking ERGO 50K once weekly, every Friday. Tolerating well with no side effects. Pt reports no current concerns today.    Reviewed ideal vitamin D levels of 50+. Continue on current regimen. We will refill ERGO today. Recheck vitamin D levels today and will review results at next visit.   Orders: -     Vitamin D (Ergocalciferol); Take 1 capsule (50,000 Units total) by mouth every 7 (seven) days.  Dispense: 4 capsule; Refill: 0 -     VITAMIN D 25 Hydroxy (Vit-D Deficiency, Fractures)   FOLLOW UP:   Return in about 29 days (around 11/26/2023). She was informed of the importance of frequent follow up visits to maximize her success with intensive lifestyle modifications for her multiple health conditions.  Subjective:   Chief complaint: Obesity [name] is here to discuss her progress with her obesity treatment plan. She is on the Category 2 Plan with B/L options and states she is following her eating plan approximately 70 % of the time. She states she is not exercising.  Interval History:  [name] is here today for a follow up office visit. since starting the program with [place].  Since last office visit she is up 2 lbs. States she ate some off-plan foods over thanksgiving. Has been eating chicken breast (air-fried) with vegetables. Is struggling with meal prep, stating her husband and son (42) tend to eat her food when she meal preps. Not skipping meals anymore. Doesn't enjoy fish.  Barriers identified: Finds meal-prepping to be her biggest challenge in her weight loss journey.   Pharmacotherapy for weight loss: She is currently taking no anti-obesity medication.   Review of Systems:  Pertinent positives were addressed with patient today.  Reviewed by clinician on day of visit: allergies, medications, problem list, medical history, surgical history, family history, social history, and previous encounter notes.   Weight Summary and Biometrics   Weight Lost Since Last Visit: 0lb  Weight Gained Since Last Visit: 2lb   Vitals Temp: 97.7 F (36.5 C) BP: 130/84 Pulse Rate: 95 SpO2: 99  %   Anthropometric Measurements Height: 5\' 2"  (1.575 m) Weight: 274 lb (124.3 kg) BMI (Calculated): 50.1 Weight at Last Visit: 272lb Weight Lost Since Last Visit: 0lb Weight Gained Since Last Visit: 2lb Starting Weight: 282lb Total Weight Loss (lbs): 8 lb (3.629 kg) Peak Weight: 287lb   Body Composition  Body Fat %: 56.3 % Fat Mass (lbs): 154.4 lbs Muscle Mass (lbs): 113.8 lbs Visceral Fat Rating : 21   Other Clinical Data Fasting: no Labs: no Today's Visit #: 6 Starting Date: 06/25/23    Objective:   PHYSICAL EXAM:  Blood pressure 130/84, pulse 95, temperature 97.7 F (36.5 C), height 5\' 2"  (1.575 m), weight 274 lb (124.3 kg), SpO2 99%. Body mass index is 50.12 kg/m.  General: she is overweight, cooperative and in no acute distress.   HEENT: EOMI, sclerae are anicteric. Lungs: Normal breathing effort, no conversational dyspnea. M-Sk:  Normal gross ROM * 4 extremities  PSYCH: Has normal mood, affect and thought process. Neurologic: No gross sensory or motor deficits. Well developed, A and O * 3  DIAGNOSTIC DATA REVIEWED:  BMET    Component Value Date/Time   NA 138 08/16/2023 0925   NA 141 02/29/2020 0940   K 4.1 08/16/2023 0925   CL 102 08/16/2023 0925   CO2 30 08/16/2023 0925   GLUCOSE 92  08/16/2023 0925   BUN 13 08/16/2023 0925   BUN 13 02/29/2020 0940   CREATININE 0.65 08/16/2023 0925   CREATININE 0.61 10/19/2015 1052   CALCIUM 9.3 08/16/2023 0925   GFRNONAA 90 02/29/2020 0940   GFRNONAA >89 10/19/2015 1052   GFRAA 103 02/29/2020 0940   GFRAA >89 10/19/2015 1052   Lab Results  Component Value Date   HGBA1C 6.0 06/12/2023   HGBA1C 5.6 06/23/2014   Lab Results  Component Value Date   INSULIN 19.5 06/25/2023   Lab Results  Component Value Date   TSH 3.62 06/12/2023   CBC    Component Value Date/Time   WBC 7.0 06/12/2023 1609   RBC 5.28 (H) 06/12/2023 1609   HGB 12.9 06/12/2023 1609   HGB 12.9 01/21/2019 1050   HCT 39.6 06/12/2023  1609   HCT 38.8 01/21/2019 1050   PLT 289.0 06/12/2023 1609   PLT 272 01/21/2019 1050   MCV 75.0 (L) 06/12/2023 1609   MCV 75 (L) 01/21/2019 1050   MCH 25.0 (L) 01/21/2019 1050   MCH 23.0 (L) 02/13/2016 1845   MCHC 32.6 06/12/2023 1609   RDW 14.9 06/12/2023 1609   RDW 17.2 (H) 01/21/2019 1050   Iron Studies    Component Value Date/Time   IRON 73 11/08/2017 1642   TIBC 363 01/31/2017 1725   FERRITIN 7 (L) 02/13/2016 1845   IRONPCTSAT 12 (L) 01/31/2017 1725   Lipid Panel     Component Value Date/Time   CHOL 149 06/25/2023 0906   TRIG 119 06/25/2023 0906   HDL 35 (L) 06/25/2023 0906   CHOLHDL 4 01/21/2023 1607   VLDL 30.4 01/21/2023 1607   LDLCALC 92 06/25/2023 0906   Hepatic Function Panel     Component Value Date/Time   PROT 7.1 06/25/2023 0906   ALBUMIN 4.4 06/25/2023 0906   AST 16 06/25/2023 0906   ALT 15 06/25/2023 0906   ALKPHOS 70 06/25/2023 0906   BILITOT 0.2 06/25/2023 0906   BILIDIR <0.10 06/25/2023 0906      Component Value Date/Time   TSH 3.62 06/12/2023 1609   Nutritional Lab Results  Component Value Date   VD25OH 34.1 06/25/2023    Attestations:   Reviewed by clinician on day of visit: allergies, medications, problem list, medical history, surgical history, family history, social history, and previous encounter notes pertinent to patient's obesity diagnosis.   I, Isabelle Course, acting as a medical scribe for Thomasene Lot, DO., have compiled all relevant documentation for today's office visit on behalf of Thomasene Lot, DO, while in the presence of [employer], DO.  I have reviewed the above documentation for accuracy and completeness, and I agree with the above. [name], D.O.  The 21st Century Cures Act was signed into law in 2016 which includes the topic of electronic health records.  This provides immediate access to information in MyChart.  This includes consultation notes, operative notes, office notes, lab results and  pathology reports.  If you have any questions about what you read please let us know at your next visit so we can discuss your concerns and take corrective action if need be.  We are right here with you.

## 2023-10-29 ENCOUNTER — Encounter (INDEPENDENT_AMBULATORY_CARE_PROVIDER_SITE_OTHER): Payer: Self-pay | Admitting: Family Medicine

## 2023-10-29 LAB — VITAMIN D 25 HYDROXY (VIT D DEFICIENCY, FRACTURES): Vit D, 25-Hydroxy: 51.4 ng/mL (ref 30.0–100.0)

## 2023-10-29 LAB — HEMOGLOBIN A1C
Est. average glucose Bld gHb Est-mCnc: 117 mg/dL
Hgb A1c MFr Bld: 5.7 % — ABNORMAL HIGH (ref 4.8–5.6)

## 2023-11-25 ENCOUNTER — Ambulatory Visit (INDEPENDENT_AMBULATORY_CARE_PROVIDER_SITE_OTHER): Payer: Commercial Managed Care - PPO | Admitting: Family Medicine

## 2023-11-25 ENCOUNTER — Other Ambulatory Visit (HOSPITAL_COMMUNITY): Payer: Self-pay

## 2023-11-25 ENCOUNTER — Encounter (INDEPENDENT_AMBULATORY_CARE_PROVIDER_SITE_OTHER): Payer: Self-pay | Admitting: Family Medicine

## 2023-11-25 VITALS — BP 136/84 | HR 86 | Temp 98.3°F | Ht 62.0 in | Wt 279.0 lb

## 2023-11-25 DIAGNOSIS — E1169 Type 2 diabetes mellitus with other specified complication: Secondary | ICD-10-CM

## 2023-11-25 DIAGNOSIS — E1159 Type 2 diabetes mellitus with other circulatory complications: Secondary | ICD-10-CM

## 2023-11-25 DIAGNOSIS — E669 Obesity, unspecified: Secondary | ICD-10-CM

## 2023-11-25 DIAGNOSIS — Z6841 Body Mass Index (BMI) 40.0 and over, adult: Secondary | ICD-10-CM | POA: Diagnosis not present

## 2023-11-25 DIAGNOSIS — E559 Vitamin D deficiency, unspecified: Secondary | ICD-10-CM

## 2023-11-25 DIAGNOSIS — I152 Hypertension secondary to endocrine disorders: Secondary | ICD-10-CM

## 2023-11-25 MED ORDER — VITAMIN D (ERGOCALCIFEROL) 1.25 MG (50000 UNIT) PO CAPS
50000.0000 [IU] | ORAL_CAPSULE | ORAL | 0 refills | Status: DC
  Filled 2023-11-25: qty 4, 28d supply, fill #0

## 2023-11-25 NOTE — Progress Notes (Signed)
 [name], D.O.  ABFM, ABOM Specializing in Clinical Bariatric Medicine  Office located at: [address] [place], [place]  [address]   Assessment and Plan:   FOR THE DISEASE OF OBESITY: BMI 50.0-59.9, adult (HCC) Obesity, Beginning BMI 51.57 Assessment & Plan: Since last office visit on 10/28/23 patient's muscle mass has increased by 4lb. Fat mass has decreased by 2.6lb. Counseling done on how various foods will affect these numbers and how to maximize success  Total lbs lost to date: 3 lbs Total weight loss percentage to date: -1.06%    Recommended Dietary Goals Shanesha is currently in the action stage of change. As such, her goal is to continue weight management plan.  She has agreed to: continue current plan   Behavioral Intervention We discussed the following today: increasing lean protein intake to established goals, increasing vegetables, increasing lower glycemic fruits, increasing water intake , identifying sources and decreasing liquid calories, avoiding temptations and identifying enticing environmental cues, continue to practice mindfulness when eating, and continue to work on maintaining a reduced calorie state, getting the recommended amount of protein, incorporating whole foods, making healthy choices, staying well hydrated and practicing mindfulness when eating.  Additional resources provided today: None  Evidence-based interventions for health behavior change were utilized today including the discussion of self monitoring techniques, problem-solving barriers and SMART goal setting techniques.   Regarding patient's less desirable eating habits and patterns, we employed the technique of small changes.   Pt will specifically work on: Continue exercising on treadmil  for next visit.    Recommended Physical Activity Goals Temari has been advised to work up to 150 minutes of moderate intensity aerobic activity a week and strengthening exercises 2-3 times per  week for cardiovascular health, weight loss maintenance and preservation of muscle mass.   She has agreed to :  Increase physical activity in their day and reduce sedentary time (increase NEAT).   Pharmacotherapy We discussed various medication options to help Dolce with her weight loss efforts and we both agreed to : continue with nutritional and behavioral strategies   FOR ASSOCIATED CONDITIONS ADDRESSED TODAY:  Vitamin D  deficiency Assessment & Plan: Lab Results  Component Value Date   VD25OH 51.4 10/28/2023   VD25OH 34.1 06/25/2023   Vitamin D  labs obtained last visit were at goal at 51.4. Improved from levels in 06/2023. Treating with ERGO 50K units once weekly. Tolerating well, no reported side effects. Reviewed ideal vitamin D  levels of 50+. Continue with current supplementation.  Orders: Refill ERGO today.    Hypertension associated with diabetes [facility] Assessment & Plan: BP Readings from Last 3 Encounters:  11/25/23 136/84  10/28/23 130/84  09/30/23 129/83   BP is stable today, at goal. HTN treated with Amlodipine  10 mg once daily, Losartan  25 mg once daily, and Toprol  XL 25 mg once daily. Compliant with antihypertensives. Tolerating well, no adverse side effects reported. No concerns in this regard today.   Continue taking antihypertensives as directed by PCP/specialists. We will continue to monitor her condition alongside PCP/specialists as it relates to her weight loss journey.   Type 2 diabetes mellitus with obesity [facility]) Assessment & Plan: Lab Results  Component Value Date   HGBA1C 5.7 (H) 10/28/2023   HGBA1C 6.0 06/12/2023   HGBA1C 5.9 01/21/2023   INSULIN  19.5 06/25/2023   Last A1C was 5.7, improved from prior A1C of 6.0 from 06/12/23. Not treating with medications, managing with diet/exercise approach. Pt eating fruits and drinking smoothies (fruit,  pineapple juice, natural juice and spinach) daily. She is drinking three 16.9 oz bottles a day.   Counseled  on importance of decreasing her liquid calories and eating whole fruits. I advise she increase her water intake to at least half her body weight in ounces of water. Reviewed importance of increasing daily protein intake. Continue to follow her prudent nutritional meal plan.    Follow up:   Return in about 4 weeks (around 12/23/2023). She was informed of the importance of frequent follow up visits to maximize her success with intensive lifestyle modifications for her multiple health conditions.  Subjective:   Chief complaint: Obesity Rosabella is here to discuss her progress with her obesity treatment plan. She is on the Category 2 Plan with B/L options and states she is following her eating plan approximately 75% of the time. She states she is exercising on the treadmill 30 minutes 3 days per week.  Interval History:  LYRIC ROSSANO is here for a follow up office visit. Since last OV, she is up 5 lbs. She reports eating off-plan foods (pound cake and turkey wings) over the holidays and at her daughter's graduation party. Has been trying to prioritize her protein intake. Eating plenty fruit and drinking daily smoothies containing spinach, apples, blueberries, blackberries, and natural yogurt, pineapple juice.   Recently had a fall at work, slipped on a puddle of water while at work. Was put on light duty for a week at work. She notes her injury has affected her ability to exercise as she previously was.  Barriers identified: Off-plan eating over the holidays and at her daughter's graduation party.   Pharmacotherapy for weight loss: She is currently taking no anti-obesity medication.   Review of Systems:  Pertinent positives were addressed with patient today.  Reviewed by clinician on day of visit: allergies, medications, problem list, medical history, surgical history, family history, social history, and previous encounter notes.  Weight Summary and Biometrics   Weight Lost Since Last Visit: 0  lb  Weight Gained Since Last Visit: 5 lb    Vitals Temp: 98.3 F (36.8 C) BP: 136/84 Pulse Rate: 86 SpO2: 97 %   Anthropometric Measurements Height: 5' 2 (1.575 m) Weight: 279 lb (126.6 kg) BMI (Calculated): 51.02 Weight at Last Visit: 274 lb Weight Lost Since Last Visit: 0 lb Weight Gained Since Last Visit: 5 lb Starting Weight: 282 lb Total Weight Loss (lbs): 3 lb (1.361 kg) Peak Weight: 287 lb   Body Composition  Body Fat %: 55.7 % Fat Mass (lbs): 115.8 lbs Muscle Mass (lbs): 117.8 lbs Visceral Fat Rating : 21   Other Clinical Data Fasting: No Labs: No Today's Visit #: 7 Starting Date: 06/25/23    Objective:   PHYSICAL EXAM: Blood pressure 136/84, pulse 86, temperature 98.3 F (36.8 C), height 5' 2 (1.575 m), weight 279 lb (126.6 kg), SpO2 97%. Body mass index is 51.03 kg/m.  General: she is overweight, cooperative and in no acute distress. PSYCH: Has normal mood, affect and thought process.   HEENT: EOMI, sclerae are anicteric. Lungs: Normal breathing effort, no conversational dyspnea. Extremities: Moves * 4 Neurologic: A and O * 3, good insight  DIAGNOSTIC DATA REVIEWED: BMET    Component Value Date/Time   NA 138 08/16/2023 0925   NA 141 02/29/2020 0940   K 4.1 08/16/2023 0925   CL 102 08/16/2023 0925   CO2 30 08/16/2023 0925   GLUCOSE 92 08/16/2023 0925   BUN 13 08/16/2023 0925  BUN 13 02/29/2020 0940   CREATININE 0.65 08/16/2023 0925   CREATININE 0.61 10/19/2015 1052   CALCIUM 9.3 08/16/2023 0925   GFRNONAA 90 02/29/2020 0940   GFRNONAA >89 10/19/2015 1052   GFRAA 103 02/29/2020 0940   GFRAA >89 10/19/2015 1052   Lab Results  Component Value Date   HGBA1C 5.7 (H) 10/28/2023   HGBA1C 5.6 06/23/2014   Lab Results  Component Value Date   INSULIN  19.5 06/25/2023   Lab Results  Component Value Date   TSH 3.62 06/12/2023   CBC    Component Value Date/Time   WBC 7.0 06/12/2023 1609   RBC 5.28 (H) 06/12/2023 1609   HGB  12.9 06/12/2023 1609   HGB 12.9 01/21/2019 1050   HCT 39.6 06/12/2023 1609   HCT 38.8 01/21/2019 1050   PLT 289.0 06/12/2023 1609   PLT 272 01/21/2019 1050   MCV 75.0 (L) 06/12/2023 1609   MCV 75 (L) 01/21/2019 1050   MCH 25.0 (L) 01/21/2019 1050   MCH 23.0 (L) 02/13/2016 1845   MCHC 32.6 06/12/2023 1609   RDW 14.9 06/12/2023 1609   RDW 17.2 (H) 01/21/2019 1050   Iron Studies    Component Value Date/Time   IRON 73 11/08/2017 1642   TIBC 363 01/31/2017 1725   FERRITIN 7 (L) 02/13/2016 1845   IRONPCTSAT 12 (L) 01/31/2017 1725   Lipid Panel     Component Value Date/Time   CHOL 149 06/25/2023 0906   TRIG 119 06/25/2023 0906   HDL 35 (L) 06/25/2023 0906   CHOLHDL 4 01/21/2023 1607   VLDL 30.4 01/21/2023 1607   LDLCALC 92 06/25/2023 0906   Hepatic Function Panel     Component Value Date/Time   PROT 7.1 06/25/2023 0906   ALBUMIN 4.4 06/25/2023 0906   AST 16 06/25/2023 0906   ALT 15 06/25/2023 0906   ALKPHOS 70 06/25/2023 0906   BILITOT 0.2 06/25/2023 0906   BILIDIR <0.10 06/25/2023 0906      Component Value Date/Time   TSH 3.62 06/12/2023 1609   Nutritional Lab Results  Component Value Date   VD25OH 51.4 10/28/2023   VD25OH 34.1 06/25/2023    Attestations:   I, Vernell Forest, acting as a stage manager for Deborah Jenkins, DO., have compiled all relevant documentation for today's office visit on behalf of Deborah Jenkins, DO, while in the presence of [employer], DO.  Reviewed by clinician on day of visit: allergies, medications, problem list, medical history, surgical history, family history, social history, and previous encounter notes pertinent to patient's obesity diagnosis.  I have reviewed the above documentation for accuracy and completeness, and I agree with the above. Deborah JINNY Marks, D.O.  The 21st Century Cures Act was signed into law in 2016 which includes the topic of electronic health records.  This provides immediate access to information in  MyChart.  This includes consultation notes, operative notes, office notes, lab results and pathology reports.  If you have any questions about what you read please let us  know at your next visit so we can discuss your concerns and take corrective action if need be.  We are right here with you.

## 2023-11-26 DIAGNOSIS — Z6841 Body Mass Index (BMI) 40.0 and over, adult: Secondary | ICD-10-CM | POA: Insufficient documentation

## 2023-11-26 DIAGNOSIS — E669 Obesity, unspecified: Secondary | ICD-10-CM | POA: Insufficient documentation

## 2023-11-26 DIAGNOSIS — E559 Vitamin D deficiency, unspecified: Secondary | ICD-10-CM | POA: Insufficient documentation

## 2023-12-04 ENCOUNTER — Other Ambulatory Visit (HOSPITAL_COMMUNITY): Payer: Self-pay

## 2023-12-04 ENCOUNTER — Other Ambulatory Visit: Payer: Self-pay | Admitting: Family Medicine

## 2023-12-04 DIAGNOSIS — I10 Essential (primary) hypertension: Secondary | ICD-10-CM

## 2023-12-04 MED ORDER — AMLODIPINE BESYLATE 10 MG PO TABS
10.0000 mg | ORAL_TABLET | Freq: Every day | ORAL | 1 refills | Status: DC
  Filled 2023-12-04: qty 90, 90d supply, fill #0
  Filled 2024-04-06: qty 90, 90d supply, fill #1

## 2023-12-04 MED ORDER — LOSARTAN POTASSIUM 25 MG PO TABS
25.0000 mg | ORAL_TABLET | Freq: Every day | ORAL | 1 refills | Status: DC
  Filled 2023-12-04: qty 90, 45d supply, fill #0
  Filled 2024-01-20: qty 90, 45d supply, fill #1

## 2023-12-19 ENCOUNTER — Encounter: Payer: Self-pay | Admitting: Family Medicine

## 2023-12-19 ENCOUNTER — Ambulatory Visit: Payer: Commercial Managed Care - PPO | Admitting: Family Medicine

## 2023-12-19 ENCOUNTER — Other Ambulatory Visit (HOSPITAL_COMMUNITY): Payer: Self-pay

## 2023-12-19 VITALS — BP 128/80 | HR 73 | Temp 98.0°F | Ht 62.0 in | Wt 277.2 lb

## 2023-12-19 DIAGNOSIS — E1169 Type 2 diabetes mellitus with other specified complication: Secondary | ICD-10-CM | POA: Diagnosis not present

## 2023-12-19 DIAGNOSIS — R208 Other disturbances of skin sensation: Secondary | ICD-10-CM

## 2023-12-19 DIAGNOSIS — E1159 Type 2 diabetes mellitus with other circulatory complications: Secondary | ICD-10-CM

## 2023-12-19 DIAGNOSIS — E669 Obesity, unspecified: Secondary | ICD-10-CM | POA: Diagnosis not present

## 2023-12-19 DIAGNOSIS — I10 Essential (primary) hypertension: Secondary | ICD-10-CM

## 2023-12-19 DIAGNOSIS — Z23 Encounter for immunization: Secondary | ICD-10-CM | POA: Diagnosis not present

## 2023-12-19 DIAGNOSIS — I152 Hypertension secondary to endocrine disorders: Secondary | ICD-10-CM | POA: Diagnosis not present

## 2023-12-19 LAB — COMPREHENSIVE METABOLIC PANEL
ALT: 15 U/L (ref 0–35)
AST: 19 U/L (ref 0–37)
Albumin: 4.6 g/dL (ref 3.5–5.2)
Alkaline Phosphatase: 70 U/L (ref 39–117)
BUN: 11 mg/dL (ref 6–23)
CO2: 30 meq/L (ref 19–32)
Calcium: 9.4 mg/dL (ref 8.4–10.5)
Chloride: 100 meq/L (ref 96–112)
Creatinine, Ser: 0.64 mg/dL (ref 0.40–1.20)
GFR: 100.86 mL/min (ref >60.00)
Glucose, Bld: 86 mg/dL (ref 70–99)
Potassium: 4 meq/L (ref 3.5–5.1)
Sodium: 138 meq/L (ref 135–145)
Total Bilirubin: 0.4 mg/dL (ref 0.2–1.2)
Total Protein: 7.6 g/dL (ref 6.0–8.3)

## 2023-12-19 LAB — LIPID PANEL
Cholesterol: 166 mg/dL (ref 0–200)
HDL: 43.1 mg/dL (ref >39.00)
LDL Cholesterol: 106 mg/dL — ABNORMAL HIGH (ref 0–99)
NonHDL: 122.91
Total CHOL/HDL Ratio: 4
Triglycerides: 84 mg/dL (ref 0.0–149.0)
VLDL: 16.8 mg/dL (ref 0.0–40.0)

## 2023-12-19 MED ORDER — METOPROLOL SUCCINATE ER 25 MG PO TB24
25.0000 mg | ORAL_TABLET | Freq: Every day | ORAL | 1 refills | Status: DC
  Filled 2023-12-19 – 2024-01-03 (×2): qty 90, 90d supply, fill #0
  Filled 2024-04-06: qty 90, 90d supply, fill #1

## 2023-12-19 MED ORDER — GABAPENTIN 100 MG PO CAPS
200.0000 mg | ORAL_CAPSULE | Freq: Every day | ORAL | 5 refills | Status: AC
  Filled 2023-12-19 – 2023-12-31 (×2): qty 60, 30d supply, fill #0
  Filled 2024-01-29: qty 60, 30d supply, fill #1
  Filled 2024-03-03: qty 60, 30d supply, fill #2
  Filled 2024-04-06: qty 60, 30d supply, fill #3
  Filled 2024-05-05: qty 60, 30d supply, fill #4
  Filled 2024-06-02: qty 60, 30d supply, fill #5

## 2023-12-19 NOTE — Progress Notes (Signed)
Subjective:  Patient ID: [name], female    DOB: Sep 30, 1971  Age: 53 y.o. MRN: [id number]  CC:  Chief Complaint  Patient presents with   Medical Management of Chronic Issues    Pt is well, no concerns at this time     HPI [name] presents for   Diabetes: Diet/exercise approach, and followed by healthy weight and wellness. On vit D.  Gabapentin low-dose for foot neuropathy.  Intermittent symptoms. Taking gabapentin most nights. Managing sx's.  Pneumonia vaccine today.  Fasting today.  Lab Results  Component Value Date   HGBA1C 5.7 (H) 10/28/2023   Wt Readings from Last 3 Encounters:  12/19/23 277 lb 3.2 oz (125.7 kg)  11/25/23 279 lb (126.6 kg)  10/28/23 274 lb (124.3 kg)   Hypertension: Attempt at higher dose of beta-blocker previously was not tolerated.  Back on 25 mg daily at her September visit.  Continued on losartan 25 mg daily, amlodipine 10mg  every day,  and aspirin. Home readings: 138/84.  BP Readings from Last 3 Encounters:  12/19/23 128/80  11/25/23 136/84  10/28/23 130/84   Lab Results  Component Value Date   CREATININE 0.65 08/16/2023          History Patient Active Problem List   Diagnosis Date Noted   Type 2 diabetes mellitus with obesity (HCC) 11/26/2023   Vitamin D deficiency 11/26/2023   BMI 50.0-59.9, adult (HCC) 11/26/2023   Obesity, Beginning BMI 51.57 11/26/2023   Diabetes mellitus without complication (HCC) 01/22/2023   Prediabetes 09/24/2022   History of anemia 09/24/2022   PONV (postoperative nausea and vomiting) 07/04/2022   Essential hypertension 07/04/2022   Anemia 07/04/2022   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   Class 3 severe obesity without serious comorbidity with body mass index (BMI) of 50.0 to 59.9 in adult [facility]) 02/15/2020   Past Medical History:  Diagnosis Date   Anemia    Difficult airway for intubation    Pt.denies updated 06/12/22   Headache(784.0)    Hypertension    Lactose intolerance     PONV (postoperative nausea and vomiting)    Pre-diabetes    Past Surgical History:  Procedure Laterality Date   COLONOSCOPY WITH PROPOFOL N/A 06/19/2022   Procedure: COLONOSCOPY WITH PROPOFOL;  Surgeon: Jenel Lucks, MD;  Location: [facility];  Service: Gastroenterology;  Laterality: N/A;   DILATION AND CURETTAGE OF UTERUS  2000   DILITATION & CURRETTAGE/HYSTROSCOPY WITH NOVASURE ABLATION N/A 02/05/2013   Procedure: DILATATION & CURETTAGE/HYSTEROSCOPY WITH NOVASURE ABLATION;  Surgeon: Meriel Pica, MD;  Location: [facility];  Service: Gynecology;  Laterality: N/A;   LAPAROSCOPIC TUBAL LIGATION Bilateral 02/05/2013   Procedure: LAPAROSCOPIC TUBAL LIGATION;  Surgeon: Meriel Pica, MD;  Location: [facility];  Service: Gynecology;  Laterality: Bilateral;  attempted tubal ligation   POLYPECTOMY  06/19/2022   Procedure: POLYPECTOMY;  Surgeon: Jenel Lucks, MD;  Location: [facility];  Service: Gastroenterology;;   Allergies  Allergen Reactions   Lactose Intolerance (Gi) Other (See Comments)    Cheese/milk products - GI upset   Prior to Admission medications   Medication Sig Start Date End Date Taking? Authorizing Provider  amLODipine (NORVASC) 10 MG tablet Take 1 tablet (10 mg total) by mouth daily. 12/04/23  Yes Shade Flood, MD  Ascorbic Acid (VITAMIN C PO) Take 1 tablet by mouth in the morning.   Yes [provider]  Biotin 78469 MCG TABS Take by mouth daily.   Yes [provider]  Blood Pressure Monitoring (BLOOD PRESSURE MONITOR 3) DEVI Use as directed. 04/09/22  Yes   gabapentin (NEURONTIN) 100 MG capsule Take 2 capsules (200 mg total) by mouth at bedtime. 08/16/23  Yes Shade Flood, MD  losartan (COZAAR) 25 MG tablet Take 1-2 tablets (25-50 mg total) by mouth daily. 12/04/23  Yes Shade Flood, MD  metoprolol succinate (TOPROL-XL) 25 MG 24 hr tablet Take 1 tablet (25 mg total) by mouth daily. Take with or immediately following a meal. 10/09/23   Yes Shade Flood, MD  Multiple Vitamin (MULTIVITAMIN WITH MINERALS) TABS tablet Take 1 tablet by mouth in the morning. Centrum for Women 50+   Yes [provider]  Vitamin D, Ergocalciferol, (DRISDOL) 1.25 MG (50000 UNIT) CAPS capsule Take 1 capsule (50,000 Units total) by mouth every 7 (seven) days. 11/25/23  Yes Thomasene Lot, DO   Social History   Socioeconomic History   Marital status: Married    Spouse name: Not on file   Number of children: Not on file   Years of education: Not on file   Highest education level: Associate degree: occupational, Scientist, product/process development, or vocational program  Occupational History   Not on file  Tobacco Use   Smoking status: Never    Passive exposure: Never   Smokeless tobacco: Never  Vaping Use   Vaping status: Never Used  Substance and Sexual Activity   Alcohol use: Yes    Comment: occas   Drug use: No   Sexual activity: Yes  Other Topics Concern   Not on file  Social History Narrative   Not on file   Social Drivers of Health   Financial Resource Strain: Low Risk  (12/17/2023)   Overall Financial Resource Strain (CARDIA)    Difficulty of Paying Living Expenses: Not hard at all  Food Insecurity: No Food Insecurity (12/17/2023)   Hunger Vital Sign    Worried About Running Out of Food in the Last Year: Never true    Ran Out of Food in the Last Year: Never true  Transportation Needs: No Transportation Needs (12/17/2023)   PRAPARE - Administrator, Civil Service (Medical): No    Lack of Transportation (Non-Medical): No  Physical Activity: Insufficiently Active (12/17/2023)   Exercise Vital Sign    Days of Exercise per Week: 3 days    Minutes of Exercise per Session: 40 min  Stress: No Stress Concern Present (12/17/2023)   [employer] of Occupational Health - Occupational Stress Questionnaire    Feeling of Stress : Not at all  Social Connections: Unknown (12/17/2023)   Social Connection and Isolation Panel [NHANES]     Frequency of Communication with Friends and Family: More than three times a week    Frequency of Social Gatherings with Friends and Family: Once a week    Attends Religious Services: Patient declined    Database administrator or Organizations: Yes    Attends Banker Meetings: 1 to 4 times per year    Marital Status: Married  Catering manager Violence: Not on file    Review of Systems  Constitutional:  Negative for fatigue and unexpected weight change.  Respiratory:  Negative for chest tightness and shortness of breath.   Cardiovascular:  Negative for chest pain, palpitations and leg swelling.  Gastrointestinal:  Negative for abdominal pain and blood in stool.  Neurological:  Negative for dizziness, syncope, light-headedness and headaches.     Objective:   Vitals:   12/19/23 0835  BP:  128/80  Pulse: 73  Temp: 98 F (36.7 C)  TempSrc: Temporal  SpO2: 99%  Weight: 277 lb 3.2 oz (125.7 kg)  Height: 5\' 2"  (1.575 m)     Physical Exam Vitals reviewed.  Constitutional:      Appearance: Normal appearance. She is well-developed.  HENT:     Head: Normocephalic and atraumatic.  Eyes:     Conjunctiva/sclera: Conjunctivae normal.     Pupils: Pupils are equal, round, and reactive to light.  Neck:     Vascular: No carotid bruit.  Cardiovascular:     Rate and Rhythm: Normal rate and regular rhythm.     Heart sounds: Normal heart sounds.  Pulmonary:     Effort: Pulmonary effort is normal.     Breath sounds: Normal breath sounds.  Abdominal:     Palpations: Abdomen is soft. There is no pulsatile mass.     Tenderness: There is no abdominal tenderness.  Musculoskeletal:     Right lower leg: No edema.     Left lower leg: No edema.  Skin:    General: Skin is warm and dry.  Neurological:     Mental Status: She is alert and oriented to person, place, and time.  Psychiatric:        Mood and Affect: Mood normal.        Behavior: Behavior normal.         Assessment & Plan:  [name] is a 53 y.o. female . Type 2 diabetes mellitus with obesity (HCC) - Plan: Comprehensive metabolic panel, Lipid panel  -Recent A1c noted.  Followed by healthy weight and wellness.  Weight has slightly improved.  Commended on improvements and continued follow-up with specialist.  Essential hypertension - Plan: metoprolol succinate (TOPROL-XL) 25 MG 24 hr tablet Hypertension associated with diabetes (HCC)  -Tolerating current regimen, refills ordered recently.  Check labs and adjust plan accordingly.  Burning sensation of feet - Plan: gabapentin (NEURONTIN) 100 MG capsule  -Stable with low-dose gabapentin without any side effects, intermittent symptoms.  RTC precautions.  Need for pneumococcal vaccination - Plan: Pneumococcal conjugate vaccine 20-valent (Prevnar 20)   Meds ordered this encounter  Medications   gabapentin (NEURONTIN) 100 MG capsule    Sig: Take 2 capsules (200 mg total) by mouth at bedtime.    Dispense:  60 capsule    Refill:  5   metoprolol succinate (TOPROL-XL) 25 MG 24 hr tablet    Sig: Take 1 tablet (25 mg total) by mouth daily. Take with or immediately following a meal.    Dispense:  90 tablet    Refill:  1   Patient Instructions  Thank you for coming in today.  No medication changes at this time.  Keep follow-up with weight management specialist and I will see you in 6 months for your physical unless any concerns prior to that time.  Take care!    Signed,   Meredith Staggers, MD [facility], [facility] 12/19/23 8:53 AM

## 2023-12-19 NOTE — Patient Instructions (Signed)
Thank you for coming in today.  No medication changes at this time.  Keep follow-up with weight management specialist and I will see you in 6 months for your physical unless any concerns prior to that time.  Take care!

## 2023-12-22 ENCOUNTER — Encounter: Payer: Self-pay | Admitting: Family Medicine

## 2023-12-23 ENCOUNTER — Other Ambulatory Visit (HOSPITAL_COMMUNITY): Payer: Self-pay

## 2023-12-23 ENCOUNTER — Ambulatory Visit (INDEPENDENT_AMBULATORY_CARE_PROVIDER_SITE_OTHER): Payer: Commercial Managed Care - PPO | Admitting: Family Medicine

## 2023-12-23 ENCOUNTER — Encounter (INDEPENDENT_AMBULATORY_CARE_PROVIDER_SITE_OTHER): Payer: Self-pay | Admitting: Family Medicine

## 2023-12-23 VITALS — BP 140/81 | HR 79 | Temp 99.1°F | Ht 62.0 in | Wt 273.0 lb

## 2023-12-23 DIAGNOSIS — E1159 Type 2 diabetes mellitus with other circulatory complications: Secondary | ICD-10-CM | POA: Diagnosis not present

## 2023-12-23 DIAGNOSIS — E1169 Type 2 diabetes mellitus with other specified complication: Secondary | ICD-10-CM

## 2023-12-23 DIAGNOSIS — Z6841 Body Mass Index (BMI) 40.0 and over, adult: Secondary | ICD-10-CM | POA: Diagnosis not present

## 2023-12-23 DIAGNOSIS — I152 Hypertension secondary to endocrine disorders: Secondary | ICD-10-CM

## 2023-12-23 DIAGNOSIS — E559 Vitamin D deficiency, unspecified: Secondary | ICD-10-CM

## 2023-12-23 DIAGNOSIS — E669 Obesity, unspecified: Secondary | ICD-10-CM

## 2023-12-23 MED ORDER — VITAMIN D (ERGOCALCIFEROL) 1.25 MG (50000 UNIT) PO CAPS
50000.0000 [IU] | ORAL_CAPSULE | ORAL | 0 refills | Status: DC
  Filled 2023-12-23: qty 4, 28d supply, fill #0

## 2023-12-23 NOTE — Progress Notes (Incomplete)
[name], D.O.  ABFM, ABOM Specializing in Clinical Bariatric Medicine  Office located at: [address] [place], [place]  [address]   Assessment and Plan:   FOR THE DISEASE OF OBESITY: Obesity, Beginning BMI 51.57 BMI 50.0-59.9, adult (HCC) - Current BMI 49.92 Assessment & Plan: Since last office visit on 11/24/22 patient's muscle mass has decreased by 2.2lb. No change to fat mass. Of note, pt's fat mass input at last visit was 115.8, accurate fat mass for last office visit was 151.8. Counseling done on how various foods will affect these numbers and how to maximize success.   Total lbs lost to date: 9 lbs Total weight loss percentage to date: -3.19%    Recommended Dietary Goals [name] is currently in the action stage of change. As such, her goal is to continue weight management plan.  She has agreed to: continue current plan   Behavioral Intervention We discussed the following today: increasing lean protein intake to established goals, decreasing simple carbohydrates , increasing vegetables, increasing water intake , continue to work on maintaining a reduced calorie state, getting the recommended amount of protein, incorporating whole foods, making healthy choices, staying well hydrated and practicing mindfulness when eating., and using GPT or another AI platform for recipe ideas- searching "low calorie, low carb, high protein chicken recipes" etc  Additional resources provided today: Personalized instruction on the use of artificial intelligence for recipes, calorie tracking, and finding healthier options when eating out.  Reviewed Cre[place] influence cauliflower rice recipes.   Evidence-based interventions for health behavior change were utilized today including the discussion of self monitoring techniques, problem-solving barriers and SMART goal setting techniques.   Regarding patient's less desirable eating habits and patterns, we employed the technique of small changes.    Pt will specifically work on: Make at least one [place] seasoning influenced cauliflower rice recipe found on Chat GPT and increase walking to 30-40 minutes 3 days per week.  for next visit.    Recommended Physical Activity Goals [name] has been advised to work up to 150 minutes of moderate intensity aerobic activity a week and strengthening exercises 2-3 times per week for cardiovascular health, weight loss maintenance and preservation of muscle mass.   She has agreed to :  Think about enjoyable ways to increase daily physical activity and overcoming barriers to exercise and Increase physical activity in their day and reduce sedentary time (increase NEAT).   Pharmacotherapy We discussed various medication options to help [name] with her weight loss efforts and we both agreed to: continue with nutritional and behavioral strategies   FOR ASSOCIATED CONDITIONS ADDRESSED TODAY: Type 2 diabetes mellitus with obesity [facility]) Assessment & Plan: Lab Results  Component Value Date   HGBA1C 5.7 (H) 10/28/2023   HGBA1C 6.0 06/12/2023   HGBA1C 5.9 01/21/2023   INSULIN 19.5 06/25/2023    Last A1C was above goal at 5.7 on 10/28/23. Not on any meds currently. Diet/Exercise approach. Denies any hunger/cravings. Pt able to eat all her food on plan. She is on gabapentin 200 mg as needed for neuropathy.   Educated pt on advantages of GLP-1s for treatment in obesity and diabetes and encouraged her to research more information online. For now, she would like to continue to work on diet/exercise prior to starting GLP-1s. Continue working on increasing exercise, meeting her daily protein goal, and following her meal plan.    Hypertension associated with diabetes [facility]) Assessment & Plan: BP Readings from Last 3 Encounters:  12/23/23 [facility])  140/81  12/19/23 128/80  11/25/23 136/84   BP not at goal today; initially was 150/80 and at repeat was 140/81. Pt attributes these elevated readings to not sleeping well  last night due to being too hot and not having her fan on. Pt denies any hx of OSA. She is on Amlodipine 10 mg once daily, Toprol-XL 25 mg once daily and Losartan 25-50 mg (1-2 tabs) once daily. All antihypertensive meds managed by PCP. Good compliance and tolerance with all antihypertensives, no SE. Pt is not checking her BP regularly at home.   Continue with antihypertensive treatment as prescribed. No changes were made today. Encouraged pt to increase her exercise and follow her heart healthy meal plan. Avoid high sodium foods. Will continue to monitor condition alongside PCP.    Vitamin D deficiency Assessment & Plan: Lab Results  Component Value Date   VD25OH 51.4 10/28/2023   VD25OH 34.1 06/25/2023   Last vitamin D was at goal at 51.4. Pt is on ERGO 50K units once weekly. Tolerating well with no SE today. No acute concerns today. Continue with current supplementation regimen as prescribed. Will refill ERGO today with no dose changes. Will continue to monitor this condition.   Orders: - Refill ERGO today, no dose changes.    Follow up:   Return in about 1 month (around 01/20/2024). She was informed of the importance of frequent follow up visits to maximize her success with intensive lifestyle modifications for her multiple health conditions.  Subjective:   Chief complaint: Obesity [name] is here to discuss her progress with her obesity treatment plan. She is on the Category 2 Plan with B/L options and states she is following her eating plan approximately 75% of the time. She states she is exercising (treadmill) 30 minutes 2 days per week.  Interval History:  [name] is here for a follow up office visit. Since last OV, she is down 6 lbs. Had been focusing on eating more lean protein intake, eating more chicken breast and fish. Tends to cook her chicken breast in sauteed vegetables. She was previously frying her fish in oil but plans to stop this and started cooking them in the air  fryer.   Barriers identified: having difficulty preparing healthy meals and having difficulty focusing on healthy eating. Still cooking fried foods (fish).   Pharmacotherapy for weight loss: She is currently taking no anti-obesity medication.   Review of Systems:  Pertinent positives were addressed with patient today.  Reviewed by clinician on day of visit: allergies, medications, problem list, medical history, surgical history, family history, social history, and previous encounter notes.  Weight Summary and Biometrics   Weight Lost Since Last Visit: 6lb  Weight Gained Since Last Visit: 0    Vitals Temp: 99.1 F (37.3 C) BP: (!) 140/81 Pulse Rate: 79 SpO2: 98 %   Anthropometric Measurements Height: 5\' 2"  (1.575 m) Weight: 273 lb (123.8 kg) BMI (Calculated): 49.92 Weight at Last Visit: 279lb Weight Lost Since Last Visit: 6lb Weight Gained Since Last Visit: 0 Starting Weight: 282lb Total Weight Loss (lbs): 9 lb (4.082 kg) Peak Weight: 287lb   Body Composition  Body Fat %: 55.5 % Fat Mass (lbs): 151.8 lbs Muscle Mass (lbs): 115.6 lbs Visceral Fat Rating : 21   Other Clinical Data Fasting: no Labs: no Today's Visit #: 8 Starting Date: 06/25/23    Objective:   PHYSICAL EXAM: Blood pressure (!) 140/81, pulse 79, temperature 99.1 F (37.3 C), height 5\' 2"  (1.575 m),  weight 273 lb (123.8 kg), SpO2 98%. Body mass index is 49.93 kg/m.  General: she is overweight, cooperative and in no acute distress. PSYCH: Has normal mood, affect and thought process.   HEENT: EOMI, sclerae are anicteric. Lungs: Normal breathing effort, no conversational dyspnea. Extremities: Moves * 4 Neurologic: A and O * 3, good insight  DIAGNOSTIC DATA REVIEWED: BMET    Component Value Date/Time   NA 138 12/19/2023 0904   NA 141 02/29/2020 0940   K 4.0 12/19/2023 0904   CL 100 12/19/2023 0904   CO2 30 12/19/2023 0904   GLUCOSE 86 12/19/2023 0904   BUN 11 12/19/2023 0904   BUN  13 02/29/2020 0940   CREATININE 0.64 12/19/2023 0904   CREATININE 0.61 10/19/2015 1052   CALCIUM 9.4 12/19/2023 0904   GFRNONAA 90 02/29/2020 0940   GFRNONAA >89 10/19/2015 1052   GFRAA 103 02/29/2020 0940   GFRAA >89 10/19/2015 1052   Lab Results  Component Value Date   HGBA1C 5.7 (H) 10/28/2023   HGBA1C 5.6 06/23/2014   Lab Results  Component Value Date   INSULIN 19.5 06/25/2023   Lab Results  Component Value Date   TSH 3.62 06/12/2023   CBC    Component Value Date/Time   WBC 7.0 06/12/2023 1609   RBC 5.28 (H) 06/12/2023 1609   HGB 12.9 06/12/2023 1609   HGB 12.9 01/21/2019 1050   HCT 39.6 06/12/2023 1609   HCT 38.8 01/21/2019 1050   PLT 289.0 06/12/2023 1609   PLT 272 01/21/2019 1050   MCV 75.0 (L) 06/12/2023 1609   MCV 75 (L) 01/21/2019 1050   MCH 25.0 (L) 01/21/2019 1050   MCH 23.0 (L) 02/13/2016 1845   MCHC 32.6 06/12/2023 1609   RDW 14.9 06/12/2023 1609   RDW 17.2 (H) 01/21/2019 1050   Iron Studies    Component Value Date/Time   IRON 73 11/08/2017 1642   TIBC 363 01/31/2017 1725   FERRITIN 7 (L) 02/13/2016 1845   IRONPCTSAT 12 (L) 01/31/2017 1725   Lipid Panel     Component Value Date/Time   CHOL 166 12/19/2023 0904   CHOL 149 06/25/2023 0906   TRIG 84.0 12/19/2023 0904   HDL 43.10 12/19/2023 0904   HDL 35 (L) 06/25/2023 0906   CHOLHDL 4 12/19/2023 0904   VLDL 16.8 12/19/2023 0904   LDLCALC 106 (H) 12/19/2023 0904   LDLCALC 92 06/25/2023 0906   Hepatic Function Panel     Component Value Date/Time   PROT 7.6 12/19/2023 0904   PROT 7.1 06/25/2023 0906   ALBUMIN 4.6 12/19/2023 0904   ALBUMIN 4.4 06/25/2023 0906   AST 19 12/19/2023 0904   ALT 15 12/19/2023 0904   ALKPHOS 70 12/19/2023 0904   BILITOT 0.4 12/19/2023 0904   BILITOT 0.2 06/25/2023 0906   BILIDIR <0.10 06/25/2023 0906      Component Value Date/Time   TSH 3.62 06/12/2023 1609   Nutritional Lab Results  Component Value Date   VD25OH 51.4 10/28/2023   VD25OH 34.1  06/25/2023    Attestations:   I, Isabelle Course , acting as a Stage manager for Thomasene Lot, DO., have compiled all relevant documentation for today's office visit on behalf of Thomasene Lot, DO, while in the presence of [employer], DO.  Reviewed by clinician on day of visit: allergies, medications, problem list, medical history, surgical history, family history, social history, and previous encounter notes pertinent to patient's obesity diagnosis.  I have reviewed the above documentation for accuracy and  completeness, and I agree with the above. [name], D.O.  The 21st Century Cures Act was signed into law in 2016 which includes the topic of electronic health records.  This provides immediate access to information in MyChart.  This includes consultation notes, operative notes, office notes, lab results and pathology reports.  If you have any questions about what you read please let us know at your next visit so we can discuss your concerns and take corrective action if need be.  We are right here with you.

## 2023-12-24 ENCOUNTER — Other Ambulatory Visit (HOSPITAL_COMMUNITY): Payer: Self-pay

## 2023-12-31 ENCOUNTER — Other Ambulatory Visit (HOSPITAL_COMMUNITY): Payer: Self-pay

## 2024-01-03 ENCOUNTER — Other Ambulatory Visit: Payer: Self-pay

## 2024-01-09 ENCOUNTER — Encounter: Payer: Self-pay | Admitting: Family Medicine

## 2024-01-10 ENCOUNTER — Encounter: Payer: Self-pay | Admitting: Family Medicine

## 2024-01-20 ENCOUNTER — Other Ambulatory Visit (HOSPITAL_COMMUNITY): Payer: Self-pay

## 2024-01-20 ENCOUNTER — Encounter (INDEPENDENT_AMBULATORY_CARE_PROVIDER_SITE_OTHER): Payer: Self-pay | Admitting: Family Medicine

## 2024-01-20 ENCOUNTER — Ambulatory Visit (INDEPENDENT_AMBULATORY_CARE_PROVIDER_SITE_OTHER): Payer: Commercial Managed Care - PPO | Admitting: Family Medicine

## 2024-01-20 VITALS — BP 143/84 | HR 85 | Temp 98.7°F | Ht 62.0 in | Wt 272.0 lb

## 2024-01-20 DIAGNOSIS — Z6841 Body Mass Index (BMI) 40.0 and over, adult: Secondary | ICD-10-CM | POA: Diagnosis not present

## 2024-01-20 DIAGNOSIS — E669 Obesity, unspecified: Secondary | ICD-10-CM | POA: Diagnosis not present

## 2024-01-20 DIAGNOSIS — I152 Hypertension secondary to endocrine disorders: Secondary | ICD-10-CM | POA: Diagnosis not present

## 2024-01-20 DIAGNOSIS — E559 Vitamin D deficiency, unspecified: Secondary | ICD-10-CM

## 2024-01-20 DIAGNOSIS — E1169 Type 2 diabetes mellitus with other specified complication: Secondary | ICD-10-CM

## 2024-01-20 DIAGNOSIS — E1159 Type 2 diabetes mellitus with other circulatory complications: Secondary | ICD-10-CM | POA: Diagnosis not present

## 2024-01-20 MED ORDER — VITAMIN D (ERGOCALCIFEROL) 1.25 MG (50000 UNIT) PO CAPS
50000.0000 [IU] | ORAL_CAPSULE | ORAL | 0 refills | Status: DC
  Filled 2024-01-20: qty 4, 28d supply, fill #0

## 2024-01-20 NOTE — Progress Notes (Signed)
 [name], D.O.  ABFM, ABOM Specializing in Clinical Bariatric Medicine  Office located at: [address] [place], [place]  [address]   Assessment and Plan:   FOR THE DISEASE OF OBESITY: Obesity, Beginning BMI 51.57 BMI 50.0-59.9, adult (HCC) - Current BMI 49.74 Assessment & Plan: Since last office visit on 12/23/2023 patient's muscle mass has decreased by 0.8 lb. Fat mass has decreased by 0.8 lb. Counseling done on how various foods will affect these numbers and how to maximize success  Total lbs lost to date: 10 lbs Total weight loss percentage to date: -3.55%    Recommended Dietary Goals [name] is currently in the action stage of change. As such, her goal is to continue weight management plan.  She has agreed to: continue current plan   Behavioral Intervention We discussed the following today: increasing lean protein intake to established goals, avoiding skipping meals, keeping healthy foods at home, decreasing eating out or consumption of processed foods, and making healthy choices when eating convenient foods, and practice mindfulness eating and understand the difference between hunger signals and cravings  Additional resources provided today: None  Evidence-based interventions for health behavior change were utilized today including the discussion of self monitoring techniques, problem-solving barriers and SMART goal setting techniques.   Regarding patient's less desirable eating habits and patterns, we employed the technique of small changes.   Pt will specifically work on: practice mindful eating and increase activity by walking 30 minutes 2 days a week for next visit.    Recommended Physical Activity Goals Azalee has been advised to work up to 150 minutes of moderate intensity aerobic activity a week and strengthening exercises 2-3 times per week for cardiovascular health, weight loss maintenance and preservation of muscle mass.   She has agreed to :  Think  about enjoyable ways to increase daily physical activity and overcoming barriers to exercise, Increase physical activity in their day and reduce sedentary time (increase NEAT)., and Start aerobic activity with a goal of 150 minutes a week at moderate intensity.    Pharmacotherapy We both agreed to: continue with nutritional and behavioral strategies and Will consider starting GLP-1s in the future.    FOR ASSOCIATED CONDITIONS ADDRESSED TODAY:  Type 2 diabetes mellitus with obesity [facility]) Assessment & Plan: Lab Results  Component Value Date   HGBA1C 5.7 (H) 10/28/2023   HGBA1C 6.0 06/12/2023   HGBA1C 5.9 01/21/2023   INSULIN 19.5 06/25/2023    No meds currently. Diet/exercise approach. Pt has been walking less frequently. She has been eating on plan, but is unsure if she is eating larger portions.  Pt denies starting GLP-1s today, stating she would like to continue trying with diet and exercise. She will consider starting GLP-1s if she continues to struggle with weight loss. Encouraged pt to continue following her meal plan and possibly start journaling to practice mindful eating. Will continue to monitor condition as it relates to her weight loss journey.    Hypertension associated with diabetes [facility]) Assessment & Plan: BP Readings from Last 3 Encounters:  01/20/24 (!) 143/84  12/23/23 (!) 140/81  12/19/23 128/80  BP was not at goal today, which she attributes to situational high stress today. On 01/11/24 her BP was 134/73. She has been monitoring her BP at least once weekly and states readings average in the 120/80s. [name] is on She is on Amlodipine 10 mg once daily, Toprol-XL 25 mg once daily and Losartan 25-50 mg (1-2 tabs) once daily. She  is tolerating all meds well with no SE reported.   Encouraged pt to follow a heart healthy diet via her prudent nutritional meal plan. Decrease simple carbs and sugars, avoid high sodium foods, and avoid eating out. Increase exercise in an effort to  improve condition and overall health. Will continue to monitor condition.    Vitamin D deficiency Assessment & Plan: Lab Results  Component Value Date   VD25OH 51.4 10/28/2023   VD25OH 34.1 06/25/2023  Last vitamin D was at goal. Pt is on ERGO 50K units once weekly. Good compliance and tolerance with no adverse SE reported. No acute concerns today. Continue with current supplementation as prescribed. Will refill ERGO today with no changes made. Will continue monitoring condition.   Orders: - Refill ERGO, no dose changes   Follow up:   Return in about 29 days (around 02/18/2024) for next f/u. Keep upcoming appts, make another appt. She was informed of the importance of frequent follow up visits to maximize her success with intensive lifestyle modifications for her multiple health conditions.  Subjective:   Chief complaint: Obesity [name] is here to discuss her progress with her obesity treatment plan. She is on the Category 2 Plan with B/L options and states she is following her eating plan approximately 75% of the time. She states she is not exercising consistently. She has been walking less than before.   Interval History:  [name] is here for a follow up office visit. Since last OV on 12/23/23, she is down 1 lb. She reports she has been walking less frequently due to knee cramping. This weekend she did walk more because she was visiting [place].. She had to walk at a slower pace in order to walk as much as she did while on the trip. She does admit that she has continued to eat rice, but has been measuring only a quarter of a cup of rice.  Pharmacotherapy for weight loss: She is currently taking no anti-obesity medication.   Review of Systems:  Pertinent positives were addressed with patient today.  Reviewed by clinician on day of visit: allergies, medications, problem list, medical history, surgical history, family history, social history, and previous encounter notes.  Weight Summary  and Biometrics   Weight Lost Since Last Visit: 1 lb  Weight Gained Since Last Visit: 0  Vitals Temp: 98.7 F (37.1 C) BP: (!) 143/84 Pulse Rate: 85 SpO2: 97 %   Anthropometric Measurements Height: 5\' 2"  (1.575 m) Weight: 272 lb (123.4 kg) BMI (Calculated): 49.74 Weight at Last Visit: 273 lb Weight Lost Since Last Visit: 1 lb Weight Gained Since Last Visit: 0 Starting Weight: 282 lb Total Weight Loss (lbs): 10 lb (4.536 kg) Peak Weight: 287 lb   Body Composition  Body Fat %: 55.5 % Fat Mass (lbs): 151 lbs Muscle Mass (lbs): 114.8 lbs Visceral Fat Rating : 20   Other Clinical Data Fasting: No Labs: No Today's Visit #: 9 Starting Date: 06/25/23    Objective:   PHYSICAL EXAM: Blood pressure (!) 143/84, pulse 85, temperature 98.7 F (37.1 C), height 5\' 2"  (1.575 m), weight 272 lb (123.4 kg), SpO2 97%. Body mass index is 49.75 kg/m.  General: she is overweight, cooperative and in no acute distress. PSYCH: Has normal mood, affect and thought process.   HEENT: EOMI, sclerae are anicteric. Lungs: Normal breathing effort, no conversational dyspnea. Extremities: Moves * 4 Neurologic: A and O * 3, good insight  DIAGNOSTIC DATA REVIEWED: BMET    Component  Value Date/Time   NA 138 12/19/2023 0904   NA 141 02/29/2020 0940   K 4.0 12/19/2023 0904   CL 100 12/19/2023 0904   CO2 30 12/19/2023 0904   GLUCOSE 86 12/19/2023 0904   BUN 11 12/19/2023 0904   BUN 13 02/29/2020 0940   CREATININE 0.64 12/19/2023 0904   CREATININE 0.61 10/19/2015 1052   CALCIUM 9.4 12/19/2023 0904   GFRNONAA 90 02/29/2020 0940   GFRNONAA >89 10/19/2015 1052   GFRAA 103 02/29/2020 0940   GFRAA >89 10/19/2015 1052   Lab Results  Component Value Date   HGBA1C 5.7 (H) 10/28/2023   HGBA1C 5.6 06/23/2014   Lab Results  Component Value Date   INSULIN 19.5 06/25/2023   Lab Results  Component Value Date   TSH 3.62 06/12/2023   CBC    Component Value Date/Time   WBC 7.0  06/12/2023 1609   RBC 5.28 (H) 06/12/2023 1609   HGB 12.9 06/12/2023 1609   HGB 12.9 01/21/2019 1050   HCT 39.6 06/12/2023 1609   HCT 38.8 01/21/2019 1050   PLT 289.0 06/12/2023 1609   PLT 272 01/21/2019 1050   MCV 75.0 (L) 06/12/2023 1609   MCV 75 (L) 01/21/2019 1050   MCH 25.0 (L) 01/21/2019 1050   MCH 23.0 (L) 02/13/2016 1845   MCHC 32.6 06/12/2023 1609   RDW 14.9 06/12/2023 1609   RDW 17.2 (H) 01/21/2019 1050   Iron Studies    Component Value Date/Time   IRON 73 11/08/2017 1642   TIBC 363 01/31/2017 1725   FERRITIN 7 (L) 02/13/2016 1845   IRONPCTSAT 12 (L) 01/31/2017 1725   Lipid Panel     Component Value Date/Time   CHOL 166 12/19/2023 0904   CHOL 149 06/25/2023 0906   TRIG 84.0 12/19/2023 0904   HDL 43.10 12/19/2023 0904   HDL 35 (L) 06/25/2023 0906   CHOLHDL 4 12/19/2023 0904   VLDL 16.8 12/19/2023 0904   LDLCALC 106 (H) 12/19/2023 0904   LDLCALC 92 06/25/2023 0906   Hepatic Function Panel     Component Value Date/Time   PROT 7.6 12/19/2023 0904   PROT 7.1 06/25/2023 0906   ALBUMIN 4.6 12/19/2023 0904   ALBUMIN 4.4 06/25/2023 0906   AST 19 12/19/2023 0904   ALT 15 12/19/2023 0904   ALKPHOS 70 12/19/2023 0904   BILITOT 0.4 12/19/2023 0904   BILITOT 0.2 06/25/2023 0906   BILIDIR <0.10 06/25/2023 0906      Component Value Date/Time   TSH 3.62 06/12/2023 1609   Nutritional Lab Results  Component Value Date   VD25OH 51.4 10/28/2023   VD25OH 34.1 06/25/2023    Attestations:   I, Isabelle Course, acting as a Stage manager for Thomasene Lot, DO., have compiled all relevant documentation for today's office visit on behalf of Thomasene Lot, DO, while in the presence of [employer], DO.  Reviewed by clinician on day of visit: allergies, medications, problem list, medical history, surgical history, family history, social history, and previous encounter notes pertinent to patient's obesity diagnosis.  I have reviewed the above documentation for  accuracy and completeness, and I agree with the above. [name], D.O.  The 21st Century Cures Act was signed into law in 2016 which includes the topic of electronic health records.  This provides immediate access to information in MyChart.  This includes consultation notes, operative notes, office notes, lab results and pathology reports.  If you have any questions about what you read please let us know  at your next visit so we can discuss your concerns and take corrective action if need be.  We are right here with you.

## 2024-02-18 ENCOUNTER — Ambulatory Visit (INDEPENDENT_AMBULATORY_CARE_PROVIDER_SITE_OTHER): Payer: Commercial Managed Care - PPO | Admitting: Family Medicine

## 2024-02-18 ENCOUNTER — Encounter (INDEPENDENT_AMBULATORY_CARE_PROVIDER_SITE_OTHER): Payer: Self-pay | Admitting: Family Medicine

## 2024-02-18 ENCOUNTER — Other Ambulatory Visit (HOSPITAL_COMMUNITY): Payer: Self-pay

## 2024-02-18 VITALS — BP 128/79 | HR 81 | Temp 97.8°F | Ht 62.0 in | Wt 271.0 lb

## 2024-02-18 DIAGNOSIS — Z6841 Body Mass Index (BMI) 40.0 and over, adult: Secondary | ICD-10-CM | POA: Diagnosis not present

## 2024-02-18 DIAGNOSIS — E669 Obesity, unspecified: Secondary | ICD-10-CM

## 2024-02-18 DIAGNOSIS — Z7985 Long-term (current) use of injectable non-insulin antidiabetic drugs: Secondary | ICD-10-CM | POA: Diagnosis not present

## 2024-02-18 DIAGNOSIS — E559 Vitamin D deficiency, unspecified: Secondary | ICD-10-CM | POA: Diagnosis not present

## 2024-02-18 DIAGNOSIS — J3089 Other allergic rhinitis: Secondary | ICD-10-CM

## 2024-02-18 DIAGNOSIS — J302 Other seasonal allergic rhinitis: Secondary | ICD-10-CM | POA: Diagnosis not present

## 2024-02-18 DIAGNOSIS — E1169 Type 2 diabetes mellitus with other specified complication: Secondary | ICD-10-CM

## 2024-02-18 MED ORDER — PATADAY 0.7 % OP SOLN: OPHTHALMIC | Status: DC

## 2024-02-18 MED ORDER — LEVOCETIRIZINE DIHYDROCHLORIDE 5 MG PO TABS
5.0000 mg | ORAL_TABLET | Freq: Every evening | ORAL | 0 refills | Status: DC
  Filled 2024-02-18: qty 90, 90d supply, fill #0

## 2024-02-18 MED ORDER — VITAMIN D (ERGOCALCIFEROL) 1.25 MG (50000 UNIT) PO CAPS
50000.0000 [IU] | ORAL_CAPSULE | ORAL | 1 refills | Status: DC
  Filled 2024-02-18: qty 4, 28d supply, fill #0

## 2024-02-18 MED ORDER — TIRZEPATIDE 2.5 MG/0.5ML ~~LOC~~ SOAJ
2.5000 mg | SUBCUTANEOUS | 1 refills | Status: DC
  Filled 2024-02-18: qty 2, 28d supply, fill #0

## 2024-02-18 MED ORDER — FLUTICASONE PROPIONATE 50 MCG/ACT NA SUSP
NASAL | 2 refills | Status: DC
  Filled 2024-02-18: qty 16, 30d supply, fill #0
  Filled 2024-05-05: qty 16, 30d supply, fill #1

## 2024-02-18 NOTE — Progress Notes (Signed)
 [name], D.O.  ABFM, ABOM Specializing in Clinical Bariatric Medicine  Office located at: [address] [place], [place]  [address]   Assessment and Plan:   No orders of the defined types were placed in this encounter.   Medications Discontinued During This Encounter  Medication Reason   Vitamin D, Ergocalciferol, (DRISDOL) 1.25 MG (50000 UNIT) CAPS capsule Reorder     Meds ordered this encounter  Medications   Vitamin D, Ergocalciferol, (DRISDOL) 1.25 MG (50000 UNIT) CAPS capsule    Sig: Take 1 capsule (50,000 Units total) by mouth every 7 (seven) days.    Dispense:  4 capsule    Refill:  1   fluticasone (FLONASE) 50 MCG/ACT nasal spray    Sig: Use 1 spray in each nostril following sinus rinses twice daily    Dispense:  16 g    Refill:  2   levocetirizine (XYZAL) 5 MG tablet    Sig: Take 1 tablet (5 mg total) by mouth every evening.    Dispense:  90 tablet    Refill:  0   Olopatadine HCl (PATADAY) 0.7 % SOLN    Sig: 1 drop daily each eye for allergies   tirzepatide (MOUNJARO) 2.5 MG/0.5ML Pen    Sig: Inject 2.5 mg into the skin once a week.    Dispense:  2 mL    Refill:  1      FOR THE DISEASE OF OBESITY:  BMI 50.0-59.9, adult (HCC) - Current BMI 49.55 Obesity, Beginning BMI 51.57  Assessment & Plan: Since last office visit on 01/20/2024 patient's  Muscle mass has decreased by 2.2 lb. Fat mass has increased by 1.8 lb. Total body water has not changed. Counseling done on how various foods will affect these numbers and how to maximize success  Total lbs lost to date: 11 lbs  Total weight loss percentage to date: 3.90%    Recommended Dietary Goals [name] is currently in the action stage of change. As such, her goal is to continue weight management plan.  She has agreed to: continue current plan   Behavioral Intervention We discussed the following today: increasing lean protein intake to established goals and continue to practice mindfulness when  eating  Additional resources provided today: Handout on 100 calorie snack ideas,  Handout on CAT 2 meal plan  Evidence-based interventions for health behavior change were utilized today including the discussion of self monitoring techniques, problem-solving barriers and SMART goal setting techniques.   Regarding patient's less desirable eating habits and patterns, we employed the technique of small changes.   Pt will specifically work on: measuring and getting in the correct amounts of lean protein.   Recommended Physical Activity Goals [name] has been advised to work up to 150 minutes of moderate intensity aerobic activity a week and strengthening exercises 2-3 times per week for cardiovascular health, weight loss maintenance and preservation of muscle mass.   She has agreed to : continue to gradually increase the amount and intensity of exercise routine   Pharmacotherapy We both agreed to : start Mounjaro 2.5 mg weekly.    FOR ASSOCIATED CONDITIONS ADDRESSED TODAY:   Type 2 diabetes mellitus with obesity (HCC) Assessment & Plan: Most recent Hemoglobin A1c and Fasting Insulin: Lab Results  Component Value Date   HGBA1C 5.7 (H) 10/28/2023   HGBA1C 6.0 06/12/2023   HGBA1C 5.9 01/21/2023   INSULIN 19.5 06/25/2023    LOV, we discussed initiating a GLP-1 for her T2DM; pt is interested  today. Pt will begin Mounjaro 2.5 mg weekly. Patient denies a personal history of pancreatitis;  and denies a family history of medullary thyroid carcinoma or multiple endocrine neoplasia type II.   Potential risks/ benefits of the medication were explained to patient. Explained that the more she eats "off-plan", the more likely for her to have side effects of the drug.  All questions were answered appropriately and alternative treatment options were discussed; patient wishes to move forward with the medication at this time.  Relevant Orders:  -     Tirzepatide; Inject 2.5 mg into the skin once a week.   Dispense: 2 mL; Refill: 1   Seasonal and environmental allergies Assessment & Plan: Experiencing a flare up of her allergies. Yesterday, she purchased and started OTC Xyzal and OTC Pataday. Encouraged to wash face or shower in evenings if they have any prolonged exposure to allergens during the day. Pt advised to START Flonase following sinus rinses twice daily. Will write for Xyzal so it is cheaper for her. Continue low inflammatory meal plan.   Relevant Orders:  -     Fluticasone Propionate; Use 1 spray in each nostril following sinus rinses twice daily  Dispense: 16 g; Refill: 2 -     Levocetirizine Dihydrochloride; Take 1 tablet (5 mg total) by mouth every evening.  Dispense: 90 tablet; Refill: 0 -     Pataday; 1 drop daily each eye for allergies   Vitamin D deficiency Assessment & Plan: Most recent Vitamin D: Lab Results  Component Value Date   VD25OH 51.4 10/28/2023   VD25OH 34.1 06/25/2023   Pt reports compliance w/ ergocalciferol 50,000 units once weekly. Continue supplementation - recheck periodically.   Relevant Orders:  -     Vitamin D (Ergocalciferol); Take 1 capsule (50,000 Units total) by mouth every 7 (seven) days.  Dispense: 4 capsule; Refill: 1   Follow up:   Return in about 4 weeks (around 03/17/2024). She was informed of the importance of frequent follow up visits to maximize her success with intensive lifestyle modifications for her multiple health conditions.  Subjective:   Chief complaint: Obesity [name] is here to discuss her progress with her obesity treatment plan. She is on the Category 2 Plan with B/L options and states she is following her eating plan approximately 75% of the time. She states she is walking on her treadmill 30 minutes 5 days per week.  Interval History:  [name] is here for a follow up office visit. Since last OV on 01/20/2024, Judah is down 1 lb. Denies skipping meals. States that she is eating less "off-plan foods". Does not get in  the full 8 ounces of lean protein at night. Sometimes feels hungry after dinner and will drink water to feel full. Reports that she is sleeping well - her Neurontin was recently reduced to 1 tablet daily.   Additionally, pt reports experiencing ongoing stiffness and pain in her left knee after falling on it 2 months ago. Encouraged pt to contact PCP about obtaining a referral to sports medicine.  Pharmacotherapy for weight loss: She is currently taking no anti-obesity medication.   Review of Systems:  Pertinent positives were addressed with patient today.  Reviewed by clinician on day of visit: allergies, medications, problem list, medical history, surgical history, family history, social history, and previous encounter notes.  Weight Summary and Biometrics   Weight Lost Since Last Visit: 1lb  Weight Gained Since Last Visit: 0   Vitals Temp: 97.8 F (  36.6 C) BP: 128/79 Pulse Rate: 81 SpO2: 99 %   Anthropometric Measurements Height: 5\' 2"  (1.575 m) Weight: 271 lb (122.9 kg) BMI (Calculated): 49.55 Weight at Last Visit: 272lb Weight Lost Since Last Visit: 1lb Weight Gained Since Last Visit: 0 Starting Weight: 282lb Total Weight Loss (lbs): 11 lb (4.99 kg) Peak Weight: 287lb   Body Composition  Body Fat %: 56.3 % Fat Mass (lbs): 152.8 lbs Muscle Mass (lbs): 112.6 lbs Visceral Fat Rating : 21   Other Clinical Data Fasting: no Labs: no Today's Visit #: 10 Starting Date: 06/25/23   Objective:   PHYSICAL EXAM: Blood pressure 128/79, pulse 81, temperature 97.8 F (36.6 C), height 5\' 2"  (1.575 m), weight 271 lb (122.9 kg), SpO2 99%. Body mass index is 49.57 kg/m.  General: she is overweight, cooperative and in no acute distress. PSYCH: Has normal mood, affect and thought process.   HEENT: EOMI, sclerae are anicteric. Lungs: Normal breathing effort, no conversational dyspnea. Extremities: Moves * 4 Neurologic: A and O * 3, good insight  DIAGNOSTIC DATA  REVIEWED: BMET    Component Value Date/Time   NA 138 12/19/2023 0904   NA 141 02/29/2020 0940   K 4.0 12/19/2023 0904   CL 100 12/19/2023 0904   CO2 30 12/19/2023 0904   GLUCOSE 86 12/19/2023 0904   BUN 11 12/19/2023 0904   BUN 13 02/29/2020 0940   CREATININE 0.64 12/19/2023 0904   CREATININE 0.61 10/19/2015 1052   CALCIUM 9.4 12/19/2023 0904   GFRNONAA 90 02/29/2020 0940   GFRNONAA >89 10/19/2015 1052   GFRAA 103 02/29/2020 0940   GFRAA >89 10/19/2015 1052   Lab Results  Component Value Date   HGBA1C 5.7 (H) 10/28/2023   HGBA1C 5.6 06/23/2014   Lab Results  Component Value Date   INSULIN 19.5 06/25/2023   Lab Results  Component Value Date   TSH 3.62 06/12/2023   CBC    Component Value Date/Time   WBC 7.0 06/12/2023 1609   RBC 5.28 (H) 06/12/2023 1609   HGB 12.9 06/12/2023 1609   HGB 12.9 01/21/2019 1050   HCT 39.6 06/12/2023 1609   HCT 38.8 01/21/2019 1050   PLT 289.0 06/12/2023 1609   PLT 272 01/21/2019 1050   MCV 75.0 (L) 06/12/2023 1609   MCV 75 (L) 01/21/2019 1050   MCH 25.0 (L) 01/21/2019 1050   MCH 23.0 (L) 02/13/2016 1845   MCHC 32.6 06/12/2023 1609   RDW 14.9 06/12/2023 1609   RDW 17.2 (H) 01/21/2019 1050   Iron Studies    Component Value Date/Time   IRON 73 11/08/2017 1642   TIBC 363 01/31/2017 1725   FERRITIN 7 (L) 02/13/2016 1845   IRONPCTSAT 12 (L) 01/31/2017 1725   Lipid Panel     Component Value Date/Time   CHOL 166 12/19/2023 0904   CHOL 149 06/25/2023 0906   TRIG 84.0 12/19/2023 0904   HDL 43.10 12/19/2023 0904   HDL 35 (L) 06/25/2023 0906   CHOLHDL 4 12/19/2023 0904   VLDL 16.8 12/19/2023 0904   LDLCALC 106 (H) 12/19/2023 0904   LDLCALC 92 06/25/2023 0906   Hepatic Function Panel     Component Value Date/Time   PROT 7.6 12/19/2023 0904   PROT 7.1 06/25/2023 0906   ALBUMIN 4.6 12/19/2023 0904   ALBUMIN 4.4 06/25/2023 0906   AST 19 12/19/2023 0904   ALT 15 12/19/2023 0904   ALKPHOS 70 12/19/2023 0904   BILITOT 0.4  12/19/2023 0904   BILITOT 0.2 06/25/2023  1610   BILIDIR <0.10 06/25/2023 0906      Component Value Date/Time   TSH 3.62 06/12/2023 1609   Nutritional Lab Results  Component Value Date   VD25OH 51.4 10/28/2023   VD25OH 34.1 06/25/2023    Attestations:   I, Special Puri, acting as a Stage manager for [employer], DO., have compiled all relevant documentation for today's office visit on behalf of Thomasene Lot, DO, while in the presence of [employer], DO.  Reviewed by clinician on day of visit: allergies, medications, problem list, medical history, surgical history, family history, social history, and previous encounter notes pertinent to patient's obesity diagnosis. I have spent 45 minutes in the care of the patient today including: preparing to see patient (e.g. review and interpretation of tests, old notes ), obtaining and/or reviewing separately obtained history, performing a medically appropriate examination or evaluation, counseling and educating the patient, ordering medications, test or procedures, documenting clinical information in the electronic or other health care record, and independently interpreting results and communicating results to the patient, family, or caregiver   I have reviewed the above documentation for accuracy and completeness, and I agree with the above. [name], D.O.  The 21st Century Cures Act was signed into law in 2016 which includes the topic of electronic health records.  This provides immediate access to information in MyChart.  This includes consultation notes, operative notes, office notes, lab results and pathology reports.  If you have any questions about what you read please let us know at your next visit so we can discuss your concerns and take corrective action if need be.  We are right here with you.

## 2024-02-25 ENCOUNTER — Telehealth: Payer: Self-pay | Admitting: Family Medicine

## 2024-02-25 NOTE — Telephone Encounter (Signed)
 Copied from CRM [id number]. Topic: General - Other >> Feb 25, 2024  9:02 AM Pascal Lux wrote:   Reason for CRM: Patient is requesting a call back from Dr. Thomasene Lot nurse to give her a call back. Patient stated if she does not answer please leave a message.

## 2024-02-26 NOTE — Telephone Encounter (Signed)
 Pt wanted a referral for knee pain last visit was in January and I advised her we do need a visit in order to place referral. She states she is going to call back to the front desk

## 2024-02-27 ENCOUNTER — Other Ambulatory Visit (HOSPITAL_COMMUNITY): Payer: Self-pay

## 2024-03-03 ENCOUNTER — Other Ambulatory Visit: Payer: Self-pay

## 2024-03-03 ENCOUNTER — Other Ambulatory Visit (HOSPITAL_COMMUNITY): Payer: Self-pay

## 2024-03-03 ENCOUNTER — Other Ambulatory Visit: Payer: Self-pay | Admitting: Family Medicine

## 2024-03-03 DIAGNOSIS — I10 Essential (primary) hypertension: Secondary | ICD-10-CM

## 2024-03-03 MED ORDER — LOSARTAN POTASSIUM 25 MG PO TABS
25.0000 mg | ORAL_TABLET | Freq: Every day | ORAL | 1 refills | Status: DC
  Filled 2024-03-03: qty 90, 45d supply, fill #0
  Filled 2024-04-15: qty 90, 45d supply, fill #1

## 2024-03-04 ENCOUNTER — Encounter: Payer: Self-pay | Admitting: Family Medicine

## 2024-03-04 ENCOUNTER — Other Ambulatory Visit (HOSPITAL_COMMUNITY): Payer: Self-pay

## 2024-03-04 ENCOUNTER — Ambulatory Visit: Admitting: Family Medicine

## 2024-03-04 VITALS — BP 128/74 | HR 74 | Ht 62.0 in | Wt 271.8 lb

## 2024-03-04 DIAGNOSIS — M25562 Pain in left knee: Secondary | ICD-10-CM

## 2024-03-04 MED ORDER — MELOXICAM 7.5 MG PO TABS
7.5000 mg | ORAL_TABLET | Freq: Every day | ORAL | 0 refills | Status: DC | PRN
  Filled 2024-03-04: qty 30, 30d supply, fill #0

## 2024-03-04 NOTE — Progress Notes (Signed)
 Subjective:  Patient ID: [name], female    DOB: 1970-12-24  Age: 53 y.o. MRN: [id number]  CC:  Chief Complaint  Patient presents with   Knee Pain    Pt notes has been having pain in Lt knee for about 2 months notes has been sore on and off, notes some stiffness     HPI [name] presents for   L knee pain: Off-and-on soreness with stiffness for past 5-6 months. No meds used, able to do normal activity at work - not limiting. Massaged area at times. Also had a fall about 10 weeks ago - slipped on water at work, R foot slid forward, left folded back, and fell onto front of knee. Able to WB, slight limp, eval by employee health. No XR. Advised to come back if not improving. Tylenol initially for 1 week. Knee stiff at times with prolonged standing.   No hx of PUD, or CKD.      History Patient Active Problem List   Diagnosis Date Noted   Type 2 diabetes mellitus with obesity (HCC) 11/26/2023   Vitamin D deficiency 11/26/2023   BMI 50.0-59.9, adult (HCC) 11/26/2023   Obesity, Beginning BMI 51.57 11/26/2023   Diabetes mellitus without complication (HCC) 01/22/2023   Prediabetes 09/24/2022   History of anemia 09/24/2022   PONV (postoperative nausea and vomiting) 07/04/2022   Essential hypertension 07/04/2022   Anemia 07/04/2022   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   Class 3 severe obesity without serious comorbidity with body mass index (BMI) of 50.0 to 59.9 in adult [facility]) 02/15/2020   Past Medical History:  Diagnosis Date   Anemia    Difficult airway for intubation    Pt.denies updated 06/12/22   Headache(784.0)    Hypertension    Lactose intolerance    PONV (postoperative nausea and vomiting)    Pre-diabetes    Past Surgical History:  Procedure Laterality Date   COLONOSCOPY WITH PROPOFOL N/A 06/19/2022   Procedure: COLONOSCOPY WITH PROPOFOL;  Surgeon: Jenel Lucks, MD;  Location: [facility];  Service: Gastroenterology;  Laterality: N/A;    DILATION AND CURETTAGE OF UTERUS  2000   DILITATION & CURRETTAGE/HYSTROSCOPY WITH NOVASURE ABLATION N/A 02/05/2013   Procedure: DILATATION & CURETTAGE/HYSTEROSCOPY WITH NOVASURE ABLATION;  Surgeon: Meriel Pica, MD;  Location: [facility];  Service: Gynecology;  Laterality: N/A;   LAPAROSCOPIC TUBAL LIGATION Bilateral 02/05/2013   Procedure: LAPAROSCOPIC TUBAL LIGATION;  Surgeon: Meriel Pica, MD;  Location: [facility];  Service: Gynecology;  Laterality: Bilateral;  attempted tubal ligation   POLYPECTOMY  06/19/2022   Procedure: POLYPECTOMY;  Surgeon: Jenel Lucks, MD;  Location: [facility];  Service: Gastroenterology;;   Allergies  Allergen Reactions   Lactose Intolerance (Gi) Other (See Comments)    Cheese/milk products - GI upset   Prior to Admission medications   Medication Sig Start Date End Date Taking? Authorizing Provider  amLODipine (NORVASC) 10 MG tablet Take 1 tablet (10 mg total) by mouth daily. 12/04/23  Yes Shade Flood, MD  Ascorbic Acid (VITAMIN C PO) Take 1 tablet by mouth in the morning.   Yes [provider]  Biotin 40981 MCG TABS Take by mouth daily.   Yes [provider]  Blood Pressure Monitoring (BLOOD PRESSURE MONITOR 3) DEVI Use as directed. 04/09/22  Yes   fluticasone (FLONASE) 50 MCG/ACT nasal spray Use 1 spray in each nostril following sinus rinses twice daily 02/18/24  Yes Opalski, Deborah, DO  gabapentin (NEURONTIN) 100  MG capsule Take 2 capsules (200 mg total) by mouth at bedtime. 12/19/23  Yes Shade Flood, MD  levocetirizine (XYZAL) 5 MG tablet Take 1 tablet (5 mg total) by mouth every evening. 02/18/24  Yes Opalski, Gavin Pound, DO  losartan (COZAAR) 25 MG tablet Take 1-2 tablets (25-50 mg total) by mouth daily. 03/03/24  Yes Shade Flood, MD  metoprolol succinate (TOPROL-XL) 25 MG 24 hr tablet Take 1 tablet (25 mg total) by mouth daily. Take with or immediately following a meal. 12/19/23  Yes Shade Flood, MD  Multiple Vitamin  (MULTIVITAMIN WITH MINERALS) TABS tablet Take 1 tablet by mouth in the morning. Centrum for Women 50+   Yes [provider]  Olopatadine HCl (PATADAY) 0.7 % SOLN 1 drop daily each eye for allergies 02/18/24  Yes Opalski, Gavin Pound, DO  tirzepatide [facility]) 2.5 MG/0.5ML Pen Inject 2.5 mg into the skin once a week. 02/18/24  Yes Opalski, Gavin Pound, DO  Vitamin D, Ergocalciferol, (DRISDOL) 1.25 MG (50000 UNIT) CAPS capsule Take 1 capsule (50,000 Units total) by mouth every 7 (seven) days. 02/18/24  Yes Opalski, Gavin Pound, DO   Social History   Socioeconomic History   Marital status: Married    Spouse name: Not on file   Number of children: Not on file   Years of education: Not on file   Highest education level: Associate degree: occupational, Scientist, product/process development, or vocational program  Occupational History   Not on file  Tobacco Use   Smoking status: Never    Passive exposure: Never   Smokeless tobacco: Never  Vaping Use   Vaping status: Never Used  Substance and Sexual Activity   Alcohol use: Yes    Comment: occas   Drug use: No   Sexual activity: Yes  Other Topics Concern   Not on file  Social History Narrative   Not on file   Social Drivers of Health   Financial Resource Strain: Low Risk  (12/17/2023)   Overall Financial Resource Strain (CARDIA)    Difficulty of Paying Living Expenses: Not hard at all  Food Insecurity: No Food Insecurity (12/17/2023)   Hunger Vital Sign    Worried About Running Out of Food in the Last Year: Never true    Ran Out of Food in the Last Year: Never true  Transportation Needs: No Transportation Needs (12/17/2023)   PRAPARE - Administrator, Civil Service (Medical): No    Lack of Transportation (Non-Medical): No  Physical Activity: Sufficiently Active (02/07/2024)   Received from [facility]   Exercise Vital Sign    Days of Exercise per Week: 5 days    Minutes of Exercise per Session: 30 min  Recent Concern: Physical Activity -  Insufficiently Active (12/17/2023)   Exercise Vital Sign    Days of Exercise per Week: 3 days    Minutes of Exercise per Session: 40 min  Stress: No Stress Concern Present (12/17/2023)   [employer] of Occupational Health - Occupational Stress Questionnaire    Feeling of Stress : Not at all  Social Connections: Unknown (12/17/2023)   Social Connection and Isolation Panel [NHANES]    Frequency of Communication with Friends and Family: More than three times a week    Frequency of Social Gatherings with Friends and Family: Once a week    Attends Religious Services: Patient declined    Database administrator or Organizations: Yes    Attends Banker Meetings: 1 to 4 times per year  Marital Status: Married  Catering manager Violence: Not on file    Review of Systems Per HPI.   Objective:   Vitals:   03/04/24 1321  BP: 128/74  Pulse: 74  SpO2: 98%  Weight: 271 lb 12.8 oz (123.3 kg)  Height: 5\' 2"  (1.575 m)     Physical Exam Vitals reviewed.  Constitutional:      General: She is not in acute distress.    Appearance: Normal appearance. She is well-developed.  HENT:     Head: Normocephalic and atraumatic.  Cardiovascular:     Rate and Rhythm: Normal rate.  Pulmonary:     Effort: Pulmonary effort is normal.  Musculoskeletal:     Comments: Left knee, skin intact, no ecchymosis, somewhat difficult exam with body habitus but no appreciable effusion.  Slight tenderness over the medial joint line, negative McMurray, varus, valgus testing.  No other bony tenderness appreciated.  Ambulating without assistive device.  Neurological:     Mental Status: She is alert and oriented to person, place, and time.  Psychiatric:        Mood and Affect: Mood normal.        Assessment & Plan:  [name] is a 53 y.o. female . Left knee pain, unspecified chronicity - Plan: meloxicam (MOBIC) 7.5 MG tablet, DG Knee Complete 4 Views Left Intermittent knee discomfort, then  fall as above.  Some stiffness, denies significant pain but more stiffness, especially with prolonged standing.  Possible underlying degenerative joint disease based on location and symptoms.  Will check x-ray given prior fall.  Letter provided for work to limit prolonged standing if possible but restrictions may need to be discussed with employee health given prior fall.  Trial of meloxicam temporarily for now, potential side effects and risk discussed, follow-up in the next 2 weeks if not improving, consider Ortho eval, PT or injection.  Meds ordered this encounter  Medications   meloxicam (MOBIC) 7.5 MG tablet    Sig: Take 1 tablet (7.5 mg total) by mouth daily as needed.    Dispense:  30 tablet    Refill:  0   Patient Instructions  Meloxicam once per day as needed for the next 2 weeks.  Do not combine that with ibuprofen or Aleve.  See information below knee pain.  Please have x-ray performed at the [facility] location in the next few days if possible.  If not improved in 2 weeks, follow-up to discuss other options including possible orthopedic evaluation, physical therapy, or injection.  Sooner if worse.  I would also recommend discussing any specific restrictions with employee health since you did have a fall (but with the pain that was there prior to that fall, the letter from me today may also help).  Let me know if there are questions and take care.  [facility] or xray: Walk in 8:30-4:30 during weekdays, no appointment needed [address]  [place], [place] [address]  Acute Knee Pain, Adult Many things can cause knee pain. Sometimes, knee pain is sudden (acute). It may be caused by damage, swelling, or irritation of the muscles and tissues that support your knee. Pain may come from: A fall. An injury to the knee from twisting motions. A hit to the knee. Infection. The pain often goes away on its own with time and rest. If the pain does not go away, tests may be done to find out  what is causing the pain. These may include: Imaging tests, such as  an X-ray, MRI, CT scan, or ultrasound. Joint aspiration. In this test, fluid is removed from the knee and checked. Arthroscopy. In this test, a lighted tube is put in the knee and an image is shown on a screen. A biopsy. In this test, a health care provider will remove a small piece of tissue for testing. Follow these instructions at home: If you have a knee sleeve or brace that can be taken off:  Wear the knee sleeve or brace as told by your provider. Take it off only if your provider says that you can. Check the skin around it every day. Tell your provider if you see problems. Loosen the knee sleeve or brace if your toes tingle, are numb, or turn cold and blue. Keep the knee sleeve or brace clean and dry. Bathing If the knee sleeve or brace is not waterproof: Do not let it get wet. Cover it when you take a bath or shower. Use a cover that does not let any water in. Managing pain, stiffness, and swelling  If told, put ice on the area. If you have a knee sleeve or brace that you can take off, remove it as told. Put ice in a plastic bag. Place a towel between your skin and the bag. Leave the ice on for 20 minutes, 2-3 times a day. If your skin turns bright red, take off the ice right away to prevent skin damage. The risk of damage is higher if you cannot feel pain, heat, or cold. Move your toes often to reduce stiffness and swelling. Raise the injured area above the level of your heart while you are sitting or lying down. Use a pillow to support your foot as needed. If told, use an elastic bandage to put pressure (compression) on your injured knee. This may control swelling, give support, and help with discomfort. Sleep with a pillow under your knee. Activity Rest your knee. Do not do things that cause pain or make pain worse. Do not stand or walk on your injured knee until you're told it's okay. Use crutches as  told. Avoid activities where both feet leave the ground at the same time and put stress on the joints. Avoid running, jumping rope, and doing jumping jacks. Work with a physical therapist to make a safe exercise program if told. Physical therapy helps your knee move better and get stronger. Exercise as told. General instructions Take your medicines only as told by your provider. If you are overweight, work with your provider and an expert in healthy eating, called a dietician, to set goals to lose weight. Being overweight can make your knee hurt more. Do not smoke, vape, or use products with nicotine or tobacco in them. If you need help quitting, talk with your provider. Return to normal activities when you are told. Ask what things are safe for you to do. Watch for any changes in your symptoms. Keep all follow-up visits. Your provider will check your healing and adjust treatments if needed. Contact a health care provider if: The knee pain does not stop. The knee pain changes or gets worse. You have a fever along with knee pain. Your knee is red or feels warm when you touch it. Your knee gives out or locks up. Get help right away if: Your knee swells and the swelling gets worse. You cannot move your knee. You have very bad knee pain that does not get better with medicine. This information is not intended to replace advice given  to you by your health care provider. Make sure you discuss any questions you have with your health care provider. Document Revised: 08/08/2023 Document Reviewed: 12/31/2022 Elsevier Patient Education  2024 Elsevier Inc.    Signed,   Caro Christmas, MD [facility], [facility] 03/04/24 1:52 PM

## 2024-03-04 NOTE — Patient Instructions (Signed)
 Meloxicam once per day as needed for the next 2 weeks.  Do not combine that with ibuprofen or Aleve.  See information below knee pain.  Please have x-ray performed at the [facility] location in the next few days if possible.  If not improved in 2 weeks, follow-up to discuss other options including possible orthopedic evaluation, physical therapy, or injection.  Sooner if worse.  I would also recommend discussing any specific restrictions with employee health since you did have a fall (but with the pain that was there prior to that fall, the letter from me today may also help).  Let me know if there are questions and take care.  [facility] or xray: Walk in 8:30-4:30 during weekdays, no appointment needed [address]  [place], [place] [address]  Acute Knee Pain, Adult Many things can cause knee pain. Sometimes, knee pain is sudden (acute). It may be caused by damage, swelling, or irritation of the muscles and tissues that support your knee. Pain may come from: A fall. An injury to the knee from twisting motions. A hit to the knee. Infection. The pain often goes away on its own with time and rest. If the pain does not go away, tests may be done to find out what is causing the pain. These may include: Imaging tests, such as an X-ray, MRI, CT scan, or ultrasound. Joint aspiration. In this test, fluid is removed from the knee and checked. Arthroscopy. In this test, a lighted tube is put in the knee and an image is shown on a screen. A biopsy. In this test, a health care provider will remove a small piece of tissue for testing. Follow these instructions at home: If you have a knee sleeve or brace that can be taken off:  Wear the knee sleeve or brace as told by your provider. Take it off only if your provider says that you can. Check the skin around it every day. Tell your provider if you see problems. Loosen the knee sleeve or brace if your toes tingle, are numb, or turn cold and blue. Keep  the knee sleeve or brace clean and dry. Bathing If the knee sleeve or brace is not waterproof: Do not let it get wet. Cover it when you take a bath or shower. Use a cover that does not let any water in. Managing pain, stiffness, and swelling  If told, put ice on the area. If you have a knee sleeve or brace that you can take off, remove it as told. Put ice in a plastic bag. Place a towel between your skin and the bag. Leave the ice on for 20 minutes, 2-3 times a day. If your skin turns bright red, take off the ice right away to prevent skin damage. The risk of damage is higher if you cannot feel pain, heat, or cold. Move your toes often to reduce stiffness and swelling. Raise the injured area above the level of your heart while you are sitting or lying down. Use a pillow to support your foot as needed. If told, use an elastic bandage to put pressure (compression) on your injured knee. This may control swelling, give support, and help with discomfort. Sleep with a pillow under your knee. Activity Rest your knee. Do not do things that cause pain or make pain worse. Do not stand or walk on your injured knee until you're told it's okay. Use crutches as told. Avoid activities where both feet leave the ground at the  same time and put stress on the joints. Avoid running, jumping rope, and doing jumping jacks. Work with a physical therapist to make a safe exercise program if told. Physical therapy helps your knee move better and get stronger. Exercise as told. General instructions Take your medicines only as told by your provider. If you are overweight, work with your provider and an expert in healthy eating, called a dietician, to set goals to lose weight. Being overweight can make your knee hurt more. Do not smoke, vape, or use products with nicotine or tobacco in them. If you need help quitting, talk with your provider. Return to normal activities when you are told. Ask what things are safe for  you to do. Watch for any changes in your symptoms. Keep all follow-up visits. Your provider will check your healing and adjust treatments if needed. Contact a health care provider if: The knee pain does not stop. The knee pain changes or gets worse. You have a fever along with knee pain. Your knee is red or feels warm when you touch it. Your knee gives out or locks up. Get help right away if: Your knee swells and the swelling gets worse. You cannot move your knee. You have very bad knee pain that does not get better with medicine. This information is not intended to replace advice given to you by your health care provider. Make sure you discuss any questions you have with your health care provider. Document Revised: 08/08/2023 Document Reviewed: 12/31/2022 Elsevier Patient Education  2024 [employer].

## 2024-03-05 ENCOUNTER — Encounter: Payer: Self-pay | Admitting: Family Medicine

## 2024-03-05 ENCOUNTER — Ambulatory Visit (INDEPENDENT_AMBULATORY_CARE_PROVIDER_SITE_OTHER)
Admission: RE | Admit: 2024-03-05 | Discharge: 2024-03-05 | Disposition: A | Discharge status: Home / Self Care | Source: Ambulatory Visit | Attending: Family Medicine | Admitting: Family Medicine

## 2024-03-05 DIAGNOSIS — M25562 Pain in left knee: Secondary | ICD-10-CM | POA: Diagnosis not present

## 2024-03-05 DIAGNOSIS — M1712 Unilateral primary osteoarthritis, left knee: Secondary | ICD-10-CM | POA: Diagnosis not present

## 2024-03-17 ENCOUNTER — Encounter (INDEPENDENT_AMBULATORY_CARE_PROVIDER_SITE_OTHER): Payer: Self-pay | Admitting: Family Medicine

## 2024-03-17 ENCOUNTER — Other Ambulatory Visit (HOSPITAL_COMMUNITY): Payer: Self-pay

## 2024-03-17 ENCOUNTER — Ambulatory Visit (INDEPENDENT_AMBULATORY_CARE_PROVIDER_SITE_OTHER): Payer: Commercial Managed Care - PPO | Admitting: Family Medicine

## 2024-03-17 VITALS — BP 132/80 | HR 86 | Temp 98.5°F | Ht 62.0 in | Wt 272.0 lb

## 2024-03-17 DIAGNOSIS — Z6841 Body Mass Index (BMI) 40.0 and over, adult: Secondary | ICD-10-CM | POA: Diagnosis not present

## 2024-03-17 DIAGNOSIS — E1169 Type 2 diabetes mellitus with other specified complication: Secondary | ICD-10-CM

## 2024-03-17 DIAGNOSIS — J3089 Other allergic rhinitis: Secondary | ICD-10-CM | POA: Diagnosis not present

## 2024-03-17 DIAGNOSIS — E669 Obesity, unspecified: Secondary | ICD-10-CM

## 2024-03-17 DIAGNOSIS — Z7985 Long-term (current) use of injectable non-insulin antidiabetic drugs: Secondary | ICD-10-CM | POA: Diagnosis not present

## 2024-03-17 DIAGNOSIS — J302 Other seasonal allergic rhinitis: Secondary | ICD-10-CM

## 2024-03-17 DIAGNOSIS — E559 Vitamin D deficiency, unspecified: Secondary | ICD-10-CM

## 2024-03-17 MED ORDER — VITAMIN D (ERGOCALCIFEROL) 1.25 MG (50000 UNIT) PO CAPS
50000.0000 [IU] | ORAL_CAPSULE | ORAL | 1 refills | Status: DC
  Filled 2024-03-17: qty 4, 28d supply, fill #0
  Filled 2024-04-14: qty 4, 28d supply, fill #1

## 2024-03-17 MED ORDER — TIRZEPATIDE 5 MG/0.5ML ~~LOC~~ SOAJ
5.0000 mg | SUBCUTANEOUS | 1 refills | Status: DC
  Filled 2024-03-17: qty 2, 28d supply, fill #0
  Filled 2024-04-14: qty 2, 28d supply, fill #1

## 2024-03-17 NOTE — Progress Notes (Signed)
 [name], D.O.  ABFM, ABOM Specializing in Clinical Bariatric Medicine  Office located at: [address] [place], [place]  [address]   Assessment and Plan:   Medications Discontinued During This Encounter  Medication Reason   tirzepatide  (MOUNJARO ) 2.5 MG/0.5ML Pen Dose change   Vitamin D , Ergocalciferol , (DRISDOL ) 1.25 MG (50000 UNIT) CAPS capsule Reorder     Meds ordered this encounter  Medications   Vitamin D , Ergocalciferol , (DRISDOL ) 1.25 MG (50000 UNIT) CAPS capsule    Sig: Take 1 capsule (50,000 Units total) by mouth every 7 (seven) days.    Dispense:  4 capsule    Refill:  1   tirzepatide  (MOUNJARO ) 5 MG/0.5ML Pen    Sig: Inject 5 mg into the skin once a week.    Dispense:  2 mL    Refill:  1    FOR THE DISEASE OF OBESITY:  BMI 50.0-59.9, adult (HCC) - Current BMI 49.74 Obesity, Beginning BMI 51.57 Assessment & Plan: Since last office visit on 02/18/2024 patient's muscle mass has decreased by 0.2 lb. Fat mass has increased by 1.4 lb. Total body water not registered on scale. Counseling done on how various foods will affect these numbers and how to maximize success  Total lbs lost to date: 10 lbs  Total weight loss percentage to date: 3.55%    Recommended Dietary Goals [name] is currently in the action stage of change. As such, her goal is to continue weight management plan.  She has agreed to: continue current plan - may consider transitioning her to journaling plan in the future.   Behavioral Intervention We discussed the following today: "healthier" substitutes for chicken wings, the benefits of food tracking, increasing lean protein intake to established goals, work on meal planning and preparation, and focusing on food with a 10:1 ratio of calories: grams of protein  Additional resources provided today: None  Evidence-based interventions for health behavior change were utilized today including the discussion of self monitoring techniques,  problem-solving barriers and SMART goal setting techniques.   Regarding patient's less desirable eating habits and patterns, we employed the technique of small changes.   Pt will specifically work on: getting in the correct amounts of lean protein/ eating everything on plan    Recommended Physical Activity Goals Dakiya has been advised to work up to 300-450 minutes of moderate intensity aerobic activity a week and strengthening exercises 2-3 times per week for cardiovascular health, weight loss maintenance and preservation of muscle mass.   She has agreed to : continue to gradually increase the amount and intensity of exercise routine   Pharmacotherapy We both agreed to :  increase Mounjaro  to 5 mg weekly   FOR ASSOCIATED CONDITIONS ADDRESSED TODAY:  Type 2 diabetes mellitus with obesity (HCC) Assessment & Plan: Most recent A1c and fasting insulin : Lab Results  Component Value Date   HGBA1C 5.7 (H) 10/28/2023   HGBA1C 6.0 06/12/2023   HGBA1C 5.9 01/21/2023   INSULIN  19.5 06/25/2023    Pt started the Mounjaro  2.5 mg weekly shortly after LOV. She has taken 3 shots so far with the last one being 2 weeks ago; states that one of the shots were "wasted". When she took the very first shot of Mounjaro , she experienced some constipation but symptoms were resolved after taking Miralax. Pt feels the 2.5 mg dose is too low and has not noticed any difference in her hunger, cravings, and "food noise". After discussion of benefits and side effects, we will INCREASE  her Mounjaro  to 5 mg weekly. Continue implementation of medically supervised weight management plan. Encouraged adequate hydration.    Seasonal and environmental allergies Assessment & Plan: Her allergy symptoms are controlled on OTC Xyzal , OTC Pataday , and Flonase . No acute concerns. Continue regimen and low inflammatory meal plan.    Vitamin D  deficiency Assessment & Plan: Most recent Vitamin D :  Lab Results  Component Value Date    VD25OH 51.4 10/28/2023   VD25OH 34.1 06/25/2023   Currently on ERGO 50,000 units weekly without any adverse effects such as nausea, vomiting or muscle weakness. Continue vitamin D  supplementation - recheck levels in the future.   Follow up:   Return 04/27/2024. She was informed of the importance of frequent follow up visits to maximize her success with intensive lifestyle modifications for her multiple health conditions.  Subjective:   Chief complaint: Obesity [name] is here to discuss her progress with her obesity treatment plan. She is on the Category 2 Plan with B/L options and states she is following her eating plan approximately 75% of the time. She states she is walking on the treadmill 30 minutes 6 days per week.  Interval History:  [name] is here for a follow up office visit. Since last OV on 02/18/2024, [name] is up 1 lb. Has been trying to increase her lean protein intake but acknowledges sometimes having chicken wings. States her biggest challenge is not eating enough. Her allergy symptoms are controlled on Flonase , Xyzal , and Pataday .   Pharmacotherapy that aid with weight loss: She is currently taking  Mounjaro  2.5 mg weekly .   Review of Systems:  Pertinent positives were addressed with patient today. Reviewed by clinician on day of visit: allergies, medications, problem list, medical history, surgical history, family history, social history, and previous encounter notes.  Weight Summary and Biometrics   Weight Lost Since Last Visit: 0lb  Weight Gained Since Last Visit: 1lb   Vitals Temp: 98.5 F (36.9 C) BP: 132/80 Pulse Rate: 86 SpO2: 100 %   Anthropometric Measurements Height: 5\' 2"  (1.575 m) Weight: 272 lb (123.4 kg) BMI (Calculated): 49.74 Weight at Last Visit: 271lb Weight Lost Since Last Visit: 0lb Weight Gained Since Last Visit: 1lb Starting Weight: 282lb Total Weight Loss (lbs): 10 lb (4.536 kg) Peak Weight: 287lb   Body Composition   Body Fat %: 56.6 % Fat Mass (lbs): 154.2 lbs Muscle Mass (lbs): 112.4 lbs Visceral Fat Rating : 21   Other Clinical Data Fasting: No Labs: No Today's Visit #: 11 Starting Date: 06/25/23   Objective:   PHYSICAL EXAM: Blood pressure 132/80, pulse 86, temperature 98.5 F (36.9 C), height 5\' 2"  (1.575 m), weight 272 lb (123.4 kg), SpO2 100%. Body mass index is 49.75 kg/m.  General: she is overweight, cooperative and in no acute distress. PSYCH: Has normal mood, affect and thought process.   HEENT: EOMI, sclerae are anicteric. Lungs: Normal breathing effort, no conversational dyspnea. Extremities: Moves * 4 Neurologic: A and O * 3, good insight  DIAGNOSTIC DATA REVIEWED: BMET    Component Value Date/Time   NA 138 12/19/2023 0904   NA 141 02/29/2020 0940   K 4.0 12/19/2023 0904   CL 100 12/19/2023 0904   CO2 30 12/19/2023 0904   GLUCOSE 86 12/19/2023 0904   BUN 11 12/19/2023 0904   BUN 13 02/29/2020 0940   CREATININE 0.64 12/19/2023 0904   CREATININE 0.61 10/19/2015 1052   CALCIUM 9.4 12/19/2023 0904   GFRNONAA 90 02/29/2020 0940  GFRNONAA >89 10/19/2015 1052   GFRAA 103 02/29/2020 0940   GFRAA >89 10/19/2015 1052   Lab Results  Component Value Date   HGBA1C 5.7 (H) 10/28/2023   HGBA1C 5.6 06/23/2014   Lab Results  Component Value Date   INSULIN  19.5 06/25/2023   Lab Results  Component Value Date   TSH 3.62 06/12/2023   CBC    Component Value Date/Time   WBC 7.0 06/12/2023 1609   RBC 5.28 (H) 06/12/2023 1609   HGB 12.9 06/12/2023 1609   HGB 12.9 01/21/2019 1050   HCT 39.6 06/12/2023 1609   HCT 38.8 01/21/2019 1050   PLT 289.0 06/12/2023 1609   PLT 272 01/21/2019 1050   MCV 75.0 (L) 06/12/2023 1609   MCV 75 (L) 01/21/2019 1050   MCH 25.0 (L) 01/21/2019 1050   MCH 23.0 (L) 02/13/2016 1845   MCHC 32.6 06/12/2023 1609   RDW 14.9 06/12/2023 1609   RDW 17.2 (H) 01/21/2019 1050   Iron Studies    Component Value Date/Time   IRON 73 11/08/2017 1642    TIBC 363 01/31/2017 1725   FERRITIN 7 (L) 02/13/2016 1845   IRONPCTSAT 12 (L) 01/31/2017 1725   Lipid Panel     Component Value Date/Time   CHOL 166 12/19/2023 0904   CHOL 149 06/25/2023 0906   TRIG 84.0 12/19/2023 0904   HDL 43.10 12/19/2023 0904   HDL 35 (L) 06/25/2023 0906   CHOLHDL 4 12/19/2023 0904   VLDL 16.8 12/19/2023 0904   LDLCALC 106 (H) 12/19/2023 0904   LDLCALC 92 06/25/2023 0906   Hepatic Function Panel     Component Value Date/Time   PROT 7.6 12/19/2023 0904   PROT 7.1 06/25/2023 0906   ALBUMIN 4.6 12/19/2023 0904   ALBUMIN 4.4 06/25/2023 0906   AST 19 12/19/2023 0904   ALT 15 12/19/2023 0904   ALKPHOS 70 12/19/2023 0904   BILITOT 0.4 12/19/2023 0904   BILITOT 0.2 06/25/2023 0906   BILIDIR <0.10 06/25/2023 0906      Component Value Date/Time   TSH 3.62 06/12/2023 1609   Nutritional Lab Results  Component Value Date   VD25OH 51.4 10/28/2023   VD25OH 34.1 06/25/2023    Attestations:   I, Special Puri, acting as a Stage manager for [employer], DO., have compiled all relevant documentation for today's office visit on behalf of Marceil Sensor, DO, while in the presence of [employer], DO.  I have spent 42 minutes in the care of the patient today. Specifically, 1 minute was spent before the visit reviewing the chart. 36 minutes was spent in direct face to face counseling of the patient regarding her motivations for change, her dietary eating habits, the effects of inconsistent eating patterns on obesity, and the characteristics/habits that are common among successful weight losers. This was all in addition to the counseling of the above diseases. 5 minutes was spent after the visit on additional documentation and chart review.    I have reviewed the above documentation for accuracy and completeness, and I agree with the above. [name], D.O.  The 21st Century Cures Act was signed into law in 2016 which includes the topic of electronic  health records.  This provides immediate access to information in MyChart.  This includes consultation notes, operative notes, office notes, lab results and pathology reports.  If you have any questions about what you read please let us  know at your next visit so we can discuss your concerns and take corrective action  if need be.  We are right here with you.

## 2024-03-18 ENCOUNTER — Other Ambulatory Visit (HOSPITAL_COMMUNITY): Payer: Self-pay

## 2024-03-26 ENCOUNTER — Ambulatory Visit: Admitting: Family Medicine

## 2024-04-15 ENCOUNTER — Other Ambulatory Visit (HOSPITAL_COMMUNITY): Payer: Self-pay

## 2024-04-15 DIAGNOSIS — Z01419 Encounter for gynecological examination (general) (routine) without abnormal findings: Secondary | ICD-10-CM | POA: Diagnosis not present

## 2024-04-15 DIAGNOSIS — Z6841 Body Mass Index (BMI) 40.0 and over, adult: Secondary | ICD-10-CM | POA: Diagnosis not present

## 2024-04-15 DIAGNOSIS — Z1231 Encounter for screening mammogram for malignant neoplasm of breast: Secondary | ICD-10-CM | POA: Diagnosis not present

## 2024-04-15 LAB — HM MAMMOGRAPHY

## 2024-04-27 ENCOUNTER — Ambulatory Visit (INDEPENDENT_AMBULATORY_CARE_PROVIDER_SITE_OTHER): Admitting: Family Medicine

## 2024-04-27 ENCOUNTER — Encounter (INDEPENDENT_AMBULATORY_CARE_PROVIDER_SITE_OTHER): Payer: Self-pay | Admitting: Family Medicine

## 2024-04-27 ENCOUNTER — Other Ambulatory Visit (HOSPITAL_COMMUNITY): Payer: Self-pay

## 2024-04-27 VITALS — BP 138/84 | HR 91 | Temp 98.9°F | Ht 62.0 in | Wt 264.0 lb

## 2024-04-27 DIAGNOSIS — Z7985 Long-term (current) use of injectable non-insulin antidiabetic drugs: Secondary | ICD-10-CM

## 2024-04-27 DIAGNOSIS — Z6841 Body Mass Index (BMI) 40.0 and over, adult: Secondary | ICD-10-CM | POA: Diagnosis not present

## 2024-04-27 DIAGNOSIS — E669 Obesity, unspecified: Secondary | ICD-10-CM

## 2024-04-27 DIAGNOSIS — E559 Vitamin D deficiency, unspecified: Secondary | ICD-10-CM | POA: Diagnosis not present

## 2024-04-27 DIAGNOSIS — E1169 Type 2 diabetes mellitus with other specified complication: Secondary | ICD-10-CM | POA: Diagnosis not present

## 2024-04-27 MED ORDER — VITAMIN D (ERGOCALCIFEROL) 1.25 MG (50000 UNIT) PO CAPS
50000.0000 [IU] | ORAL_CAPSULE | ORAL | 1 refills | Status: DC
  Filled 2024-04-27 – 2024-05-11 (×2): qty 4, 28d supply, fill #0

## 2024-04-27 MED ORDER — TIRZEPATIDE 5 MG/0.5ML ~~LOC~~ SOAJ
5.0000 mg | SUBCUTANEOUS | 1 refills | Status: DC
  Filled 2024-04-27 – 2024-05-11 (×2): qty 2, 28d supply, fill #0

## 2024-04-27 NOTE — Progress Notes (Signed)
 [name], D.O.  ABFM, ABOM Specializing in Clinical Bariatric Medicine  Office located at: [address] [place], [place]  [address]   Assessment and Plan:   Orders Placed This Encounter  Procedures   VITAMIN D  25 Hydroxy (Vit-D Deficiency, Fractures)   Lipid panel   Hemoglobin A1c   Insulin , random   Comprehensive metabolic panel with GFR    Medications Discontinued During This Encounter  Medication Reason   Vitamin D , Ergocalciferol , (DRISDOL ) 1.25 MG (50000 UNIT) CAPS capsule Reorder   tirzepatide  (MOUNJARO ) 5 MG/0.5ML Pen Reorder     Meds ordered this encounter  Medications   tirzepatide  (MOUNJARO ) 5 MG/0.5ML Pen    Sig: Inject 5 mg into the skin once a week.    Dispense:  2 mL    Refill:  1   Vitamin D , Ergocalciferol , (DRISDOL ) 1.25 MG (50000 UNIT) CAPS capsule    Sig: Take 1 capsule (50,000 Units total) by mouth every 7 (seven) days.    Dispense:  4 capsule    Refill:  1    Will put in orders for fasting labs; pt will come in 2-4 days prior to next OV to obtain them   FOR THE DISEASE OF OBESITY:  BMI 50.0-59.9, adult (HCC) - Current BMI 48.27 Obesity, Beginning BMI 51.57 Assessment & Plan: Since last office visit on 03/17/2024 patient's  Muscle mass has increased by 10.6 lb. Fat mass has decreased by 19.8 lb. Total body water is 94.2 lbs. Counseling done on how various foods will affect these numbers and how to maximize success  Total lbs lost to date: 18 lbs  Total weight loss percentage to date: 6.34%    Recommended Dietary Goals [name] is currently in the action stage of change. As such, her goal is to continue weight management plan.  She has agreed to: continue current plan   Behavioral Intervention We discussed the following today: continue to work on maintaining a reduced calorie state, getting the recommended amount of protein, incorporating whole foods, making healthy choices, staying well hydrated and practicing mindfulness when  eating.  Additional resources provided today: None  Evidence-based interventions for health behavior change were utilized today including the discussion of self monitoring techniques, problem-solving barriers and SMART goal setting techniques.   Regarding patient's less desirable eating habits and patterns, we employed the technique of small changes.   Pt will specifically work on: n/a   Recommended Physical Activity Goals [name] has been advised to work up to 300-450 minutes of moderate intensity aerobic activity a week and strengthening exercises 2-3 times per week for cardiovascular health, weight loss maintenance and preservation of muscle mass.   She has agreed to : continue to gradually increase the amount and intensity of exercise routine   Pharmacotherapy We both agreed to continue current regimen.    ASSOCIATED CONDITIONS ADDRESSED TODAY:  Type 2 diabetes mellitus with obesity [facility]) Assessment & Plan: Lab Results  Component Value Date   HGBA1C 5.7 (H) 10/28/2023   HGBA1C 6.0 06/12/2023   HGBA1C 5.9 01/21/2023   INSULIN  19.5 06/25/2023    Denies symptoms of hypoglycemia or hyperglycemia. On Mounjaro  5 mg wkly. States she felt uncomfortable when she first went up to the 5 mg dose. Apart from this, she has been tolerating it very well. Her hunger and cravings are controlled.   Maintain with Mounjaro  at current dose - reminded her the more she eats off-plan, the more likely for her to experience side effects of the  drug.  Encouraged adequate hydration. Continue with reduced calorie meal plan low on processed crabs and simple sugars. Ongoing weight loss will improve insulin  resistance and Hemoglobin A1c. Recheck labs.    Vitamin D  deficiency Assessment & Plan: Lab Results  Component Value Date   VD25OH 51.4 10/28/2023   VD25OH 34.1 06/25/2023   Currently on ERGO 50,000 units weekly without any adverse effects such as nausea, vomiting or muscle weakness. Continue  vitamin D  supplementation - recheck levels in the future. Recheck labs.    Follow up:   Return 05/25/2024 at 4:00 PM. She was informed of the importance of frequent follow up visits to maximize her success with intensive lifestyle modifications for her multiple health conditions.  Subjective:   Chief complaint: Obesity [name] is here to discuss her progress with her obesity treatment plan. She is on the Category 2 Plan with B/L options and states she is following her eating plan approximately 80% of the time. She states she is walking 30 minutes 5 days per week.  Interval History:  BAYLEN DEA is here for a follow up office visit. Since last OV on 03/17/2024, [name] is down 8 lbs. She's been focused on getting in more protein, veggies, and fruits. She gets in 16 ounces of lean protein daily. Is eating less rice. Is not skipping meals.    Pharmacotherapy that aid with for weight loss: She is currently taking Mounjaro  5 mg wkly.   Review of Systems:  Pertinent positives were addressed with patient today.  Reviewed by clinician on day of visit: allergies, medications, problem list, medical history, surgical history, family history, social history, and previous encounter notes.  Weight Summary and Biometrics   Weight Lost Since Last Visit: 8lb  Weight Gained Since Last Visit: 0lb  Vitals Temp: 98.9 F (37.2 C) BP: 138/84 Pulse Rate: 91 SpO2: 96 %   Anthropometric Measurements Height: 5' 2 (1.575 m) Weight: 264 lb (119.7 kg) BMI (Calculated): 48.27 Weight at Last Visit: 272lb Weight Lost Since Last Visit: 8lb Weight Gained Since Last Visit: 0lb Starting Weight: 282lb Total Weight Loss (lbs): 18 lb (8.165 kg) Peak Weight: 287lb   Body Composition  Body Fat %: 50.9 % Fat Mass (lbs): 134.4 lbs Muscle Mass (lbs): 123 lbs Total Body Water (lbs): 94.2 lbs Visceral Fat Rating : 18   Other Clinical Data Fasting: No Labs: No Today's Visit #: 12 Starting Date:  06/25/23   Objective:   PHYSICAL EXAM: Blood pressure 138/84, pulse 91, temperature 98.9 F (37.2 C), height 5' 2 (1.575 m), weight 264 lb (119.7 kg), SpO2 96%. Body mass index is 48.29 kg/m.  General: she is overweight, cooperative and in no acute distress. PSYCH: Has normal mood, affect and thought process.   HEENT: EOMI, sclerae are anicteric. Lungs: Normal breathing effort, no conversational dyspnea. Extremities: Moves * 4 Neurologic: A and O * 3, good insight  DIAGNOSTIC DATA REVIEWED: BMET    Component Value Date/Time   NA 138 12/19/2023 0904   NA 141 02/29/2020 0940   K 4.0 12/19/2023 0904   CL 100 12/19/2023 0904   CO2 30 12/19/2023 0904   GLUCOSE 86 12/19/2023 0904   BUN 11 12/19/2023 0904   BUN 13 02/29/2020 0940   CREATININE 0.64 12/19/2023 0904   CREATININE 0.61 10/19/2015 1052   CALCIUM 9.4 12/19/2023 0904   GFRNONAA 90 02/29/2020 0940   GFRNONAA >89 10/19/2015 1052   GFRAA 103 02/29/2020 0940   GFRAA >89 10/19/2015 1052   Lab Results  Component Value Date   HGBA1C 5.7 (H) 10/28/2023   HGBA1C 5.6 06/23/2014   Lab Results  Component Value Date   INSULIN  19.5 06/25/2023   Lab Results  Component Value Date   TSH 3.62 06/12/2023   CBC    Component Value Date/Time   WBC 7.0 06/12/2023 1609   RBC 5.28 (H) 06/12/2023 1609   HGB 12.9 06/12/2023 1609   HGB 12.9 01/21/2019 1050   HCT 39.6 06/12/2023 1609   HCT 38.8 01/21/2019 1050   PLT 289.0 06/12/2023 1609   PLT 272 01/21/2019 1050   MCV 75.0 (L) 06/12/2023 1609   MCV 75 (L) 01/21/2019 1050   MCH 25.0 (L) 01/21/2019 1050   MCH 23.0 (L) 02/13/2016 1845   MCHC 32.6 06/12/2023 1609   RDW 14.9 06/12/2023 1609   RDW 17.2 (H) 01/21/2019 1050   Iron Studies    Component Value Date/Time   IRON 73 11/08/2017 1642   TIBC 363 01/31/2017 1725   FERRITIN 7 (L) 02/13/2016 1845   IRONPCTSAT 12 (L) 01/31/2017 1725   Lipid Panel     Component Value Date/Time   CHOL 166 12/19/2023 0904   CHOL 149  06/25/2023 0906   TRIG 84.0 12/19/2023 0904   HDL 43.10 12/19/2023 0904   HDL 35 (L) 06/25/2023 0906   CHOLHDL 4 12/19/2023 0904   VLDL 16.8 12/19/2023 0904   LDLCALC 106 (H) 12/19/2023 0904   LDLCALC 92 06/25/2023 0906   Hepatic Function Panel     Component Value Date/Time   PROT 7.6 12/19/2023 0904   PROT 7.1 06/25/2023 0906   ALBUMIN 4.6 12/19/2023 0904   ALBUMIN 4.4 06/25/2023 0906   AST 19 12/19/2023 0904   ALT 15 12/19/2023 0904   ALKPHOS 70 12/19/2023 0904   BILITOT 0.4 12/19/2023 0904   BILITOT 0.2 06/25/2023 0906   BILIDIR <0.10 06/25/2023 0906      Component Value Date/Time   TSH 3.62 06/12/2023 1609   Nutritional Lab Results  Component Value Date   VD25OH 51.4 10/28/2023   VD25OH 34.1 06/25/2023    Attestations:   I, Special Puri , acting as a Stage manager for [employer], DO., have compiled all relevant documentation for today's office visit on behalf of Marceil Sensor, DO, while in the presence of [employer], DO.  I have reviewed the above documentation for accuracy and completeness, and I agree with the above. [name], D.O.  The 21st Century Cures Act was signed into law in 2016 which includes the topic of electronic health records.  This provides immediate access to information in MyChart.  This includes consultation notes, operative notes, office notes, lab results and pathology reports.  If you have any questions about what you read please let us  know at your next visit so we can discuss your concerns and take corrective action if need be.  We are right here with you.

## 2024-04-28 ENCOUNTER — Other Ambulatory Visit (HOSPITAL_COMMUNITY): Payer: Self-pay

## 2024-05-11 ENCOUNTER — Other Ambulatory Visit (HOSPITAL_COMMUNITY): Payer: Self-pay

## 2024-05-20 DIAGNOSIS — E1169 Type 2 diabetes mellitus with other specified complication: Secondary | ICD-10-CM | POA: Diagnosis not present

## 2024-05-20 DIAGNOSIS — E669 Obesity, unspecified: Secondary | ICD-10-CM | POA: Diagnosis not present

## 2024-05-20 DIAGNOSIS — E559 Vitamin D deficiency, unspecified: Secondary | ICD-10-CM | POA: Diagnosis not present

## 2024-05-21 LAB — INSULIN, RANDOM: INSULIN: 12.9 u[IU]/mL (ref 2.6–24.9)

## 2024-05-21 LAB — COMPREHENSIVE METABOLIC PANEL WITH GFR
ALT: 15 IU/L (ref 0–32)
AST: 16 IU/L (ref 0–40)
Albumin: 4.4 g/dL (ref 3.8–4.9)
Alkaline Phosphatase: 79 IU/L (ref 44–121)
BUN/Creatinine Ratio: 21 (ref 9–23)
BUN: 14 mg/dL (ref 6–24)
Bilirubin Total: 0.3 mg/dL (ref 0.0–1.2)
CO2: 22 mmol/L (ref 20–29)
Calcium: 9.1 mg/dL (ref 8.7–10.2)
Chloride: 101 mmol/L (ref 96–106)
Creatinine, Ser: 0.67 mg/dL (ref 0.57–1.00)
Globulin, Total: 2.5 g/dL (ref 1.5–4.5)
Glucose: 82 mg/dL (ref 70–99)
Potassium: 4.3 mmol/L (ref 3.5–5.2)
Sodium: 137 mmol/L (ref 134–144)
Total Protein: 6.9 g/dL (ref 6.0–8.5)
eGFR: 104 mL/min/{1.73_m2} (ref >59)

## 2024-05-21 LAB — LIPID PANEL
Chol/HDL Ratio: 3.1 ratio (ref 0.0–4.4)
Cholesterol, Total: 141 mg/dL (ref 100–199)
HDL: 45 mg/dL (ref >39)
LDL Chol Calc (NIH): 81 mg/dL (ref 0–99)
Triglycerides: 76 mg/dL (ref 0–149)
VLDL Cholesterol Cal: 15 mg/dL (ref 5–40)

## 2024-05-21 LAB — HEMOGLOBIN A1C
Est. average glucose Bld gHb Est-mCnc: 105 mg/dL
Hgb A1c MFr Bld: 5.3 % (ref 4.8–5.6)

## 2024-05-21 LAB — VITAMIN D 25 HYDROXY (VIT D DEFICIENCY, FRACTURES): Vit D, 25-Hydroxy: 51 ng/mL (ref 30.0–100.0)

## 2024-05-25 ENCOUNTER — Encounter (INDEPENDENT_AMBULATORY_CARE_PROVIDER_SITE_OTHER): Payer: Self-pay | Admitting: Family Medicine

## 2024-05-25 ENCOUNTER — Ambulatory Visit (INDEPENDENT_AMBULATORY_CARE_PROVIDER_SITE_OTHER): Admitting: Family Medicine

## 2024-05-25 VITALS — BP 124/77 | HR 78 | Temp 98.9°F | Ht 62.0 in | Wt 268.0 lb

## 2024-05-25 DIAGNOSIS — E1169 Type 2 diabetes mellitus with other specified complication: Secondary | ICD-10-CM

## 2024-05-25 DIAGNOSIS — E1159 Type 2 diabetes mellitus with other circulatory complications: Secondary | ICD-10-CM | POA: Diagnosis not present

## 2024-05-25 DIAGNOSIS — Z6841 Body Mass Index (BMI) 40.0 and over, adult: Secondary | ICD-10-CM

## 2024-05-25 DIAGNOSIS — E669 Obesity, unspecified: Secondary | ICD-10-CM | POA: Diagnosis not present

## 2024-05-25 DIAGNOSIS — E559 Vitamin D deficiency, unspecified: Secondary | ICD-10-CM | POA: Diagnosis not present

## 2024-05-25 DIAGNOSIS — J302 Other seasonal allergic rhinitis: Secondary | ICD-10-CM

## 2024-05-25 DIAGNOSIS — I152 Hypertension secondary to endocrine disorders: Secondary | ICD-10-CM | POA: Diagnosis not present

## 2024-05-25 DIAGNOSIS — J3089 Other allergic rhinitis: Secondary | ICD-10-CM | POA: Diagnosis not present

## 2024-05-25 DIAGNOSIS — Z7985 Long-term (current) use of injectable non-insulin antidiabetic drugs: Secondary | ICD-10-CM

## 2024-05-25 MED ORDER — TIRZEPATIDE 5 MG/0.5ML ~~LOC~~ SOAJ
5.0000 mg | SUBCUTANEOUS | 0 refills | Status: DC
  Filled 2024-05-25 – 2024-06-04 (×2): qty 2, 28d supply, fill #0

## 2024-05-25 MED ORDER — VITAMIN D (ERGOCALCIFEROL) 1.25 MG (50000 UNIT) PO CAPS
50000.0000 [IU] | ORAL_CAPSULE | ORAL | 0 refills | Status: DC
  Filled 2024-05-25 – 2024-06-04 (×2): qty 12, 84d supply, fill #0

## 2024-05-25 NOTE — Progress Notes (Signed)
 Deborah DOROTHA Marks, D.O.  ABFM, ABOM Specializing in Clinical Bariatric Medicine  Office located at: [address] [place], [place]  [address]   Assessment and Plan:   Medications Discontinued During This Encounter  Medication Reason   Olopatadine  HCl (PATADAY ) 0.7 % SOLN    tirzepatide  (MOUNJARO ) 5 MG/0.5ML Pen Reorder   Vitamin D , Ergocalciferol , (DRISDOL ) 1.25 MG (50000 UNIT) CAPS capsule Reorder     Meds ordered this encounter  Medications   tirzepatide  (MOUNJARO ) 5 MG/0.5ML Pen    Sig: Inject 5 mg into the skin once a week.    Dispense:  2 mL    Refill:  0   Vitamin D , Ergocalciferol , (DRISDOL ) 1.25 MG (50000 UNIT) CAPS capsule    Sig: Take 1 capsule (50,000 Units total) by mouth every 7 (seven) days.    Dispense:  12 capsule    Refill:  0     FOR THE DISEASE OF OBESITY:  BMI 45.0-49.9, adult (HCC)- current 49.02 Obesity, Beginning BMI 51.57 Assessment & Plan: Since last office visit on 04/27/2024 patient's muscle mass has decreased by 9 lbs. Fat mass has increased by 14 lbs. Total body water not registered on scale. Body fat percentage has increased by 4.4%. Counseling done on how various foods will affect these numbers and how to maximize success  Total lbs lost to date: 14 lbs  Total weight loss percentage to date: 4.96%    Recommended Dietary Goals [name] is currently in the action stage of change. As such, her goal is to continue weight management plan.  She has agreed to: continue current plan   Behavioral Intervention We discussed the following today: increasing lean protein intake to established goals, decreasing simple carbohydrates , and continue to work on implementation of reduced calorie nutritional plan  Additional resources provided today: None  Evidence-based interventions for health behavior change were utilized today including the discussion of self monitoring techniques, problem-solving barriers and SMART goal setting techniques.    Regarding patient's less desirable eating habits and patterns, we employed the technique of small changes.   Pt will specifically work on: n/a    Recommended Physical Activity Goals [name] has been advised to work up to 300-450 minutes of moderate intensity aerobic activity a week and strengthening exercises 2-3 times per week for cardiovascular health, weight loss maintenance and preservation of muscle mass.   She has agreed to: continue to gradually increase the amount and intensity of exercise routine   Pharmacotherapy Continue same regimen.    ASSOCIATED CONDITIONS ADDRESSED TODAY:  All labs from 05/20/2024 were reviewed with patient today and education provided on them. We discussed how the foods patient eats may influence these laboratory findings.  All of the patient's questions about them were answered     Type 2 diabetes mellitus with obesity [facility]) Assessment & Plan: Lab Results  Component Value Date   HGBA1C 5.3 05/20/2024   HGBA1C 5.7 (H) 10/28/2023   HGBA1C 6.0 06/12/2023   INSULIN  12.9 05/20/2024   INSULIN  19.5 06/25/2023   Lab Results  Component Value Date   CREATININE 0.67 05/20/2024   BUN 14 05/20/2024   NA 137 05/20/2024   K 4.3 05/20/2024   CL 101 05/20/2024   CO2 22 05/20/2024      Component Value Date/Time   PROT 6.9 05/20/2024 0741   ALBUMIN 4.4 05/20/2024 0741   AST 16 05/20/2024 0741   ALT 15 05/20/2024 0741   ALKPHOS 79 05/20/2024 0741   BILITOT 0.3 05/20/2024  0741   BILIDIR <0.10 06/25/2023 0906   Lab Results  Component Value Date   CHOL 141 05/20/2024   HDL 45 05/20/2024   LDLCALC 81 05/20/2024   TRIG 76 05/20/2024   CHOLHDL 3.1 05/20/2024   Denies symptoms of hypoglycemia or hyperglycemia. On Mounjaro  5 mg weekly with good adherence and no SE. Good control of hunger and cravings.   Reviewed labs obtained LOV. Her Hemoglobin A1c improved from 5.7 to 5.3. Fasting insulin  improved from 19.5 to 12.9. Kidney function, electrolytes, liver  enzymes are within normal limits. Lipid panel WNL w/exception of LDL. Goal LDL <70 for a diabetic.   Recommend she discuss with Dr.Greene about whether he feels its appropriate to start a low-dose statin. Continue working on nutrition plan to decrease simple carbohydrates, saturated and trans fats, and increase lean proteins. Maintain with Mounjaro  at same dose; consider increasing next OV.      Hypertension associated with diabetes (HCC) Assessment & Plan: Last 3 blood pressure readings in our office are as follows: BP Readings from Last 3 Encounters:  05/25/24 124/77  04/27/24 138/84  03/17/24 132/80   The 10-year ASCVD risk score (Arnett DK, et al., 2019) is: 8.1%  Lab Results  Component Value Date   CREATININE 0.67 05/20/2024   Blood pressure is controlled on Amlodipine , Losartan , and Toprol -Xl.  Continue adherence to medications and low sodium diet. Advance exercise as able.     Seasonal and environmental allergies Assessment & Plan: Her allergy symptoms are controlled on  Xyzal . Continue regimen and low inflammatory meal plan.     Vitamin D  deficiency Assessment & Plan: Lab Results  Component Value Date   VD25OH 51.0 05/20/2024   VD25OH 51.4 10/28/2023   VD25OH 34.1 06/25/2023   She is currently on ergocalciferol  50,000 units weekly without adverse SE. Her Vitamin D  levels are at goal of 50 to 70. Continue supplement. Continue to recheck periodically.    Follow up:   Return 06/23/2024 at 4:20 PM. She was informed of the importance of frequent follow up visits to maximize her success with intensive lifestyle modifications for her multiple health conditions.   Subjective:   Chief complaint: Obesity [name] is here to discuss her progress with her obesity treatment plan.She is on the Category 2 Plan with B/L options and states she is following her eating plan approximately 85% of the time. She states she is using the walking on the tread-mill  30 minutes 4 days per  week.   Interval History:  [name] is here for a follow up office visit. Since last OV on 04/27/2024 , she is up 4 lbs. She endorses over eating  during the 4th of July weekend. Prior to this, she reports strong adherence to her prudent nutritional plan. Good control of hunger and cravings.    Pharmacotherapy that aid with weight loss: She is currently taking Mounjaro  5 mg wkly.    Review of Systems:  Pertinent positives were addressed with patient today.  Reviewed by clinician on day of visit: allergies, medications, problem list, medical history, surgical history, family history, social history, and previous encounter notes.  Weight Summary and Biometrics   Weight Lost Since Last Visit: 0lb  Weight Gained Since Last Visit: 4lb    Vitals Temp: 98.9 F (37.2 C) BP: 124/77 Pulse Rate: 78 SpO2: 99 %   Anthropometric Measurements Height: 5' 2 (1.575 m) Weight: 268 lb (121.6 kg) BMI (Calculated): 49.01 Weight at Last Visit: 264lb Weight Lost Since Last Visit:  0lb Weight Gained Since Last Visit: 4lb Starting Weight: 282lb Total Weight Loss (lbs): 14 lb (6.35 kg) Peak Weight: 287lb   Body Composition  Body Fat %: 55.3 % Fat Mass (lbs): 148.4 lbs Muscle Mass (lbs): 114 lbs Visceral Fat Rating : 20   Other Clinical Data Fasting: No Labs: No Today's Visit #: 13 Starting Date: 06/25/23    Objective:   PHYSICAL EXAM: Blood pressure 124/77, pulse 78, temperature 98.9 F (37.2 C), height 5' 2 (1.575 m), weight 268 lb (121.6 kg), SpO2 99%. Body mass index is 49.02 kg/m.  General: she is overweight, cooperative and in no acute distress. PSYCH: Has normal mood, affect and thought process.   HEENT: EOMI, sclerae are anicteric. Lungs: Normal breathing effort, no conversational dyspnea. Extremities: Moves * 4 Neurologic: A and O * 3, good insight  DIAGNOSTIC DATA REVIEWED: BMET    Component Value Date/Time   NA 137 05/20/2024 0741   K 4.3 05/20/2024  0741   CL 101 05/20/2024 0741   CO2 22 05/20/2024 0741   GLUCOSE 82 05/20/2024 0741   GLUCOSE 86 12/19/2023 0904   BUN 14 05/20/2024 0741   CREATININE 0.67 05/20/2024 0741   CREATININE 0.61 10/19/2015 1052   CALCIUM 9.1 05/20/2024 0741   GFRNONAA 90 02/29/2020 0940   GFRNONAA >89 10/19/2015 1052   GFRAA 103 02/29/2020 0940   GFRAA >89 10/19/2015 1052   Lab Results  Component Value Date   HGBA1C 5.3 05/20/2024   HGBA1C 5.6 06/23/2014   Lab Results  Component Value Date   INSULIN  12.9 05/20/2024   INSULIN  19.5 06/25/2023   Lab Results  Component Value Date   TSH 3.62 06/12/2023   CBC    Component Value Date/Time   WBC 7.0 06/12/2023 1609   RBC 5.28 (H) 06/12/2023 1609   HGB 12.9 06/12/2023 1609   HGB 12.9 01/21/2019 1050   HCT 39.6 06/12/2023 1609   HCT 38.8 01/21/2019 1050   PLT 289.0 06/12/2023 1609   PLT 272 01/21/2019 1050   MCV 75.0 (L) 06/12/2023 1609   MCV 75 (L) 01/21/2019 1050   MCH 25.0 (L) 01/21/2019 1050   MCH 23.0 (L) 02/13/2016 1845   MCHC 32.6 06/12/2023 1609   RDW 14.9 06/12/2023 1609   RDW 17.2 (H) 01/21/2019 1050   Iron Studies    Component Value Date/Time   IRON 73 11/08/2017 1642   TIBC 363 01/31/2017 1725   FERRITIN 7 (L) 02/13/2016 1845   IRONPCTSAT 12 (L) 01/31/2017 1725   Lipid Panel     Component Value Date/Time   CHOL 141 05/20/2024 0741   TRIG 76 05/20/2024 0741   HDL 45 05/20/2024 0741   CHOLHDL 3.1 05/20/2024 0741   CHOLHDL 4 12/19/2023 0904   VLDL 16.8 12/19/2023 0904   LDLCALC 81 05/20/2024 0741   Hepatic Function Panel     Component Value Date/Time   PROT 6.9 05/20/2024 0741   ALBUMIN 4.4 05/20/2024 0741   AST 16 05/20/2024 0741   ALT 15 05/20/2024 0741   ALKPHOS 79 05/20/2024 0741   BILITOT 0.3 05/20/2024 0741   BILIDIR <0.10 06/25/2023 0906      Component Value Date/Time   TSH 3.62 06/12/2023 1609   Nutritional Lab Results  Component Value Date   VD25OH 51.0 05/20/2024   VD25OH 51.4 10/28/2023    VD25OH 34.1 06/25/2023    Attestations:   I, Special Puri , acting as a Stage manager for [employer], DO., have compiled all relevant documentation for  today's office visit on behalf of Deborah Jenkins, DO, while in the presence of Deborah Jenkins, DO.  I have spent 40 minutes in the care of the patient today including 28 minutes face-to-face assessing and reviewing listed medical problems above as outlined in office visit note and providing nutritional and behavioral counseling as outlined in obesity care plan.   I have reviewed the above documentation for accuracy and completeness, and I agree with the above. Deborah JINNY Marks, D.O.  The 21st Century Cures Act was signed into law in 2016 which includes the topic of electronic health records.  This provides immediate access to information in MyChart. This includes consultation notes, operative notes, office notes, lab results and pathology reports.  If you have any questions about what you read please let us  know at your next visit so we can discuss your concerns and take corrective action if need be.  We are right here with you.

## 2024-05-26 ENCOUNTER — Other Ambulatory Visit (HOSPITAL_COMMUNITY): Payer: Self-pay

## 2024-06-02 ENCOUNTER — Other Ambulatory Visit: Payer: Self-pay | Admitting: Family Medicine

## 2024-06-02 ENCOUNTER — Other Ambulatory Visit (HOSPITAL_COMMUNITY): Payer: Self-pay

## 2024-06-02 ENCOUNTER — Other Ambulatory Visit: Payer: Self-pay

## 2024-06-02 DIAGNOSIS — I10 Essential (primary) hypertension: Secondary | ICD-10-CM

## 2024-06-02 MED ORDER — LOSARTAN POTASSIUM 25 MG PO TABS
25.0000 mg | ORAL_TABLET | Freq: Every day | ORAL | 1 refills | Status: DC
  Filled 2024-06-02: qty 90, 45d supply, fill #0

## 2024-06-04 ENCOUNTER — Other Ambulatory Visit: Payer: Self-pay

## 2024-06-04 ENCOUNTER — Other Ambulatory Visit (HOSPITAL_COMMUNITY): Payer: Self-pay

## 2024-06-19 ENCOUNTER — Encounter: Payer: Commercial Managed Care - PPO | Admitting: Family Medicine

## 2024-06-22 ENCOUNTER — Ambulatory Visit (INDEPENDENT_AMBULATORY_CARE_PROVIDER_SITE_OTHER): Admitting: Family Medicine

## 2024-06-23 ENCOUNTER — Other Ambulatory Visit (HOSPITAL_COMMUNITY): Payer: Self-pay

## 2024-06-23 ENCOUNTER — Encounter (INDEPENDENT_AMBULATORY_CARE_PROVIDER_SITE_OTHER): Payer: Self-pay | Admitting: Family Medicine

## 2024-06-23 ENCOUNTER — Ambulatory Visit (INDEPENDENT_AMBULATORY_CARE_PROVIDER_SITE_OTHER): Admitting: Family Medicine

## 2024-06-23 VITALS — BP 104/72 | HR 79 | Temp 98.7°F | Ht 62.0 in | Wt 266.0 lb

## 2024-06-23 DIAGNOSIS — E669 Obesity, unspecified: Secondary | ICD-10-CM

## 2024-06-23 DIAGNOSIS — E1169 Type 2 diabetes mellitus with other specified complication: Secondary | ICD-10-CM | POA: Diagnosis not present

## 2024-06-23 DIAGNOSIS — E559 Vitamin D deficiency, unspecified: Secondary | ICD-10-CM | POA: Diagnosis not present

## 2024-06-23 DIAGNOSIS — I152 Hypertension secondary to endocrine disorders: Secondary | ICD-10-CM

## 2024-06-23 DIAGNOSIS — Z6841 Body Mass Index (BMI) 40.0 and over, adult: Secondary | ICD-10-CM | POA: Diagnosis not present

## 2024-06-23 DIAGNOSIS — E1159 Type 2 diabetes mellitus with other circulatory complications: Secondary | ICD-10-CM

## 2024-06-23 DIAGNOSIS — Z7985 Long-term (current) use of injectable non-insulin antidiabetic drugs: Secondary | ICD-10-CM | POA: Diagnosis not present

## 2024-06-23 MED ORDER — TIRZEPATIDE 7.5 MG/0.5ML ~~LOC~~ SOAJ
7.5000 mg | SUBCUTANEOUS | 0 refills | Status: DC
  Filled 2024-06-23 – 2024-06-29 (×3): qty 2, 28d supply, fill #0

## 2024-06-23 NOTE — Progress Notes (Signed)
 [name], D.O.  ABFM, ABOM Specializing in Clinical Bariatric Medicine  Office located at: [address] [place], [place]  [address]    Assessment and Plan:   Medications Discontinued During This Encounter  Medication Reason   Biotin 10000 MCG TABS Patient Preference   tirzepatide  (MOUNJARO ) 5 MG/0.5ML Pen Dose change     Meds ordered this encounter  Medications   tirzepatide  (MOUNJARO ) 7.5 MG/0.5ML Pen    Sig: Inject 7.5 mg into the skin once a week.    Dispense:  2 mL    Refill:  0     FOR THE DISEASE OF OBESITY:  BMI 45.0-49.9, adult (HCC) current 48.64 Obesity, Beginning BMI 51.57 Assessment & Plan: Since last office visit on 05/25/2024  patient's muscle mass has decreased by 1.2 lbs. Fat mass has decreased by 1 lbs. Body fat % has increased  by 0.1  %. Counseling done on how various foods will affect these numbers and how to maximize success  Total lbs lost to date: -16 lbs Total weight loss percentage to date: -5.67 %   Recommended Dietary Goals Sheilah is currently in the action stage of change. As such, her goal is to continue weight management plan.  Continue Category 2 Plan with B/L options. She was also strongly encouraged to journal 1200-1300 calories and 90+ grams protein daily for increased awareness.    Behavioral Intervention We discussed the following today: the concept of paying to play, increasing lean protein intake to established goals, increasing water intake , work on tracking and journaling calories using tracking application, continue to work on implementation of reduced calorie nutritional plan, and using GPT or another AI platform for recipe ideas- searching low calorie, low carb, high protein chicken recipes etc  Additional resources provided today: Handout on CAT 2 meal plan, Handout on CAT 1-2 lunch options, and Handout on Common Characteristics of Successful Weight Losers and Maintainers , Handout on healthy CHAT-GPT Fish  recipe.  Evidence-based interventions for health behavior change were utilized today including the discussion of self monitoring techniques, problem-solving barriers and SMART goal setting techniques.   Regarding patient's less desirable eating habits and patterns, we employed the technique of small changes.   Goal: make foods she loves in a healthy manner + journal intake   Recommended Physical Activity Goals Sharren has been advised to work up to 300-450 minutes of moderate intensity aerobic activity a week and strengthening exercises 2-3 times per week for cardiovascular health, weight loss maintenance and preservation of muscle mass.   Exercise goal: walk 30 minutes daily    Pharmacotherapy See T2DM note.   ASSOCIATED CONDITIONS ADDRESSED TODAY:   Type 2 diabetes mellitus with obesity [facility]) Assessment & Plan: Lab Results  Component Value Date   HGBA1C 5.3 05/20/2024   HGBA1C 5.7 (H) 10/28/2023   HGBA1C 6.0 06/12/2023   INSULIN  12.9 05/20/2024   INSULIN  19.5 06/25/2023   HgbA1c is at great control for age and comorbid conditions. Denies symptoms of hypoglycemia or hyperglycemia. On Mounjaro  5 mg weekly with good adherence and no side effects. No complaints of excessive hunger and cravings. She is able to eat everything on plan.   - Pt is agreeable to INCREASING Mounjaro  to 7.5 mg weekly to aid with further weight loss. Reviewed risks/benefits. Encouraged adequate fiber and water intake.  - Continue working on nutrition plan to decrease simple carbohydrates, saturated and trans fats, and increase lean proteins - She may advance exercise as able.  Hypertension associated with diabetes [facility]) Assessment & Plan: Last 3 blood pressure readings in our office are as follows: BP Readings from Last 3 Encounters:  06/23/24 104/72  05/25/24 124/77  04/27/24 138/84   Lab Results  Component Value Date   CHOL 141 05/20/2024   HDL 45 05/20/2024   LDLCALC 81 05/20/2024   TRIG 76  05/20/2024   CHOLHDL 3.1 05/20/2024   She reports compliance with Amlodipine  10 mg daily, Losartan  25-50 mg daily, and Toprol -XL 25 mg daily. BP low normal today. Pt asx. Her BP averages 120s/70s at home. Of note, lipid panel dated 05/20/2024 was WNL w/exception of LDL. Goal LDL <70 for a diabetic.   - Recommend she discuss with Dr.Greene about whether he feels its appropriate to start a low-dose statin  - Continue all BP medications.  - Continue to work on low sodium nutrition plan.  - She may advance exercise as tolerated.  - Monitor for orthostasis while losing weight.     Vitamin D  deficiency Assessment & Plan: Lab Results  Component Value Date   VD25OH 51.0 05/20/2024   VD25OH 51.4 10/28/2023   VD25OH 34.1 06/25/2023   She is currently on ergocalciferol  50,000 units weekly without adverse SE. No acute concerns.   - Continue Vit D supplementation.  - Continue to monitor levels as deemed clinically necessary     Follow up:   Return 08/03/2024 4:00 PM.  She was informed of the importance of frequent follow up visits to maximize her success with intensive lifestyle modifications for her multiple health conditions.   Subjective:   Chief complaint: Obesity Teneisha is here to discuss her progress with her obesity treatment plan. She is on the Category 2 Plan with B/L options  and states she is following her eating plan approximately 80% of the time. She states she is walking at the park or on the treadmill 20 minutes 5 days per week.   Interval History:  CEDRA VILLALON is here for a follow up office visit. Since last OV on 05/25/2024 , she is down 2 lbs. States she is focusing on her fruit and protein intake. She typically has 12 ounces of lean protein distributed between breakfast and lunch and 6-8 ounces at dinner. She appears to sometimes get extra calories from different sources such as when she has deep fried fish or high calorie cafeteria oatmeal. She rarely eats out. She drinks  about 50-60 ounces of water per day. She is sleeping 7-9 hours per night.    Pharmacotherapy that aid with weight loss: She is currently taking Mounjaro  5 mg weekly.   Review of Systems:  Pertinent positives were addressed with patient today.  Reviewed by clinician on day of visit: allergies, medications, problem list, medical history, surgical history, family history, social history, and previous encounter notes.  Weight Summary and Biometrics   Weight Lost Since Last Visit: 2lb  Weight Gained Since Last Visit: 0lb    Vitals Temp: 98.7 F (37.1 C) BP: 104/72 Pulse Rate: 79 SpO2: 100 %   Anthropometric Measurements Height: 5' 2 (1.575 m) Weight: 266 lb (120.7 kg) BMI (Calculated): 48.64 Weight at Last Visit: 268lb Weight Lost Since Last Visit: 2lb Weight Gained Since Last Visit: 0lb Starting Weight: 282lb Total Weight Loss (lbs): 16 lb (7.258 kg) Peak Weight: 287lb   Body Composition  Body Fat %: 55.4 % Fat Mass (lbs): 147.4 lbs Muscle Mass (lbs): 112.8 lbs Visceral Fat Rating : 19   Other Clinical Data Fasting: No  Labs: no Today's Visit #: 14 Starting Date: 06/25/23    Objective:   PHYSICAL EXAM: Blood pressure 104/72, pulse 79, temperature 98.7 F (37.1 C), height 5' 2 (1.575 m), weight 266 lb (120.7 kg), SpO2 100%. Body mass index is 48.65 kg/m.  General: she is overweight, cooperative and in no acute distress. PSYCH: Has normal mood, affect and thought process.   HEENT: EOMI, sclerae are anicteric. Lungs: Normal breathing effort, no conversational dyspnea. Extremities: Moves * 4 Neurologic: A and O * 3, good insight  DIAGNOSTIC DATA REVIEWED: BMET    Component Value Date/Time   NA 137 05/20/2024 0741   K 4.3 05/20/2024 0741   CL 101 05/20/2024 0741   CO2 22 05/20/2024 0741   GLUCOSE 82 05/20/2024 0741   GLUCOSE 86 12/19/2023 0904   BUN 14 05/20/2024 0741   CREATININE 0.67 05/20/2024 0741   CREATININE 0.61 10/19/2015 1052   CALCIUM  9.1 05/20/2024 0741   GFRNONAA 90 02/29/2020 0940   GFRNONAA >89 10/19/2015 1052   GFRAA 103 02/29/2020 0940   GFRAA >89 10/19/2015 1052   Lab Results  Component Value Date   HGBA1C 5.3 05/20/2024   HGBA1C 5.6 06/23/2014   Lab Results  Component Value Date   INSULIN  12.9 05/20/2024   INSULIN  19.5 06/25/2023   Lab Results  Component Value Date   TSH 3.62 06/12/2023   CBC    Component Value Date/Time   WBC 7.0 06/12/2023 1609   RBC 5.28 (H) 06/12/2023 1609   HGB 12.9 06/12/2023 1609   HGB 12.9 01/21/2019 1050   HCT 39.6 06/12/2023 1609   HCT 38.8 01/21/2019 1050   PLT 289.0 06/12/2023 1609   PLT 272 01/21/2019 1050   MCV 75.0 (L) 06/12/2023 1609   MCV 75 (L) 01/21/2019 1050   MCH 25.0 (L) 01/21/2019 1050   MCH 23.0 (L) 02/13/2016 1845   MCHC 32.6 06/12/2023 1609   RDW 14.9 06/12/2023 1609   RDW 17.2 (H) 01/21/2019 1050   Iron Studies    Component Value Date/Time   IRON 73 11/08/2017 1642   TIBC 363 01/31/2017 1725   FERRITIN 7 (L) 02/13/2016 1845   IRONPCTSAT 12 (L) 01/31/2017 1725   Lipid Panel     Component Value Date/Time   CHOL 141 05/20/2024 0741   TRIG 76 05/20/2024 0741   HDL 45 05/20/2024 0741   CHOLHDL 3.1 05/20/2024 0741   CHOLHDL 4 12/19/2023 0904   VLDL 16.8 12/19/2023 0904   LDLCALC 81 05/20/2024 0741   Hepatic Function Panel     Component Value Date/Time   PROT 6.9 05/20/2024 0741   ALBUMIN 4.4 05/20/2024 0741   AST 16 05/20/2024 0741   ALT 15 05/20/2024 0741   ALKPHOS 79 05/20/2024 0741   BILITOT 0.3 05/20/2024 0741   BILIDIR <0.10 06/25/2023 0906      Component Value Date/Time   TSH 3.62 06/12/2023 1609   Nutritional Lab Results  Component Value Date   VD25OH 51.0 05/20/2024   VD25OH 51.4 10/28/2023   VD25OH 34.1 06/25/2023    Attestations:   I, Special Puri, acting as a Stage manager for [employer], DO., have compiled all relevant documentation for today's office visit on behalf of Deborah Jenkins, DO, while in  the presence of [employer], DO.  I have spent 51 minutes in the care of the patient today including 45 minutes face-to-face assessing and reviewing listed medical problems above as outlined in office visit note and providing nutritional and behavioral counseling as  outlined in obesity care plan.   I have reviewed the above documentation for accuracy and completeness, and I agree with the above. Deborah JINNY Marks, D.O.  The 21st Century Cures Act was signed into law in 2016 which includes the topic of electronic health records.  This provides immediate access to information in MyChart.  This includes consultation notes, operative notes, office notes, lab results and pathology reports.  If you have any questions about what you read please let us  know at your next visit so we can discuss your concerns and take corrective action if need be.  We are right here with you.

## 2024-06-29 ENCOUNTER — Other Ambulatory Visit (HOSPITAL_COMMUNITY): Payer: Self-pay

## 2024-06-29 ENCOUNTER — Telehealth (HOSPITAL_COMMUNITY): Payer: Self-pay

## 2024-06-29 ENCOUNTER — Other Ambulatory Visit: Payer: Self-pay | Admitting: Family Medicine

## 2024-06-29 ENCOUNTER — Other Ambulatory Visit: Payer: Self-pay

## 2024-06-29 DIAGNOSIS — I10 Essential (primary) hypertension: Secondary | ICD-10-CM

## 2024-06-29 MED ORDER — METOPROLOL SUCCINATE ER 25 MG PO TB24
25.0000 mg | ORAL_TABLET | Freq: Every day | ORAL | 1 refills | Status: DC
  Filled 2024-06-29: qty 90, 90d supply, fill #0
  Filled 2024-09-14: qty 90, 90d supply, fill #1

## 2024-06-29 MED ORDER — AMLODIPINE BESYLATE 10 MG PO TABS
10.0000 mg | ORAL_TABLET | Freq: Every day | ORAL | 1 refills | Status: AC
  Filled 2024-06-29: qty 90, 90d supply, fill #0
  Filled 2024-09-14: qty 90, 90d supply, fill #1

## 2024-06-29 NOTE — Telephone Encounter (Signed)
 Pharmacy Patient Advocate Encounter  Received notification from [facility] that Prior Authorization for Mounjaro  7.5MG /0.5ML auto-injectors   has been APPROVED from 06/29/24 to 06/29/25. Ran test claim, Copay is $25. This test claim was processed through [facility]- copay amounts may vary at other pharmacies due to pharmacy/plan contracts, or as the patient moves through the different stages of their insurance plan.   PA #/Case ID/Reference #: [id number]

## 2024-06-29 NOTE — Telephone Encounter (Signed)
 Pharmacy Patient Advocate Encounter   Received notification from Onbase that prior authorization for Mounjaro  7.5MG /0.5ML auto-injectors  is required/requested.   Insurance verification completed.   The patient is insured through [facility] .   Per test claim: PA required; PA submitted to above mentioned insurance via Latent Key/confirmation #/EOC [id number] Status is pending

## 2024-06-29 NOTE — Telephone Encounter (Signed)
 PA request has been Received. New Encounter has been or will be created for follow up. For additional info see Pharmacy Prior Auth telephone encounter from 06/29/24.

## 2024-07-13 ENCOUNTER — Other Ambulatory Visit (HOSPITAL_COMMUNITY): Payer: Self-pay

## 2024-07-13 ENCOUNTER — Encounter: Payer: Self-pay | Admitting: Family Medicine

## 2024-07-13 ENCOUNTER — Ambulatory Visit (INDEPENDENT_AMBULATORY_CARE_PROVIDER_SITE_OTHER): Admitting: Family Medicine

## 2024-07-13 VITALS — BP 128/78 | HR 80 | Temp 97.9°F | Ht 62.0 in | Wt 262.1 lb

## 2024-07-13 DIAGNOSIS — E669 Obesity, unspecified: Secondary | ICD-10-CM

## 2024-07-13 DIAGNOSIS — Z9189 Other specified personal risk factors, not elsewhere classified: Secondary | ICD-10-CM

## 2024-07-13 DIAGNOSIS — I152 Hypertension secondary to endocrine disorders: Secondary | ICD-10-CM | POA: Diagnosis not present

## 2024-07-13 DIAGNOSIS — Z7985 Long-term (current) use of injectable non-insulin antidiabetic drugs: Secondary | ICD-10-CM

## 2024-07-13 DIAGNOSIS — Z0000 Encounter for general adult medical examination without abnormal findings: Secondary | ICD-10-CM | POA: Diagnosis not present

## 2024-07-13 DIAGNOSIS — I10 Essential (primary) hypertension: Secondary | ICD-10-CM

## 2024-07-13 DIAGNOSIS — E1169 Type 2 diabetes mellitus with other specified complication: Secondary | ICD-10-CM

## 2024-07-13 DIAGNOSIS — E1159 Type 2 diabetes mellitus with other circulatory complications: Secondary | ICD-10-CM | POA: Diagnosis not present

## 2024-07-13 MED ORDER — LOSARTAN POTASSIUM 25 MG PO TABS
25.0000 mg | ORAL_TABLET | Freq: Every day | ORAL | 1 refills | Status: DC
  Filled 2024-07-13: qty 90, 45d supply, fill #0
  Filled 2024-08-31: qty 90, 45d supply, fill #1

## 2024-07-13 MED ORDER — PRAVASTATIN SODIUM 20 MG PO TABS
20.0000 mg | ORAL_TABLET | Freq: Every day | ORAL | 1 refills | Status: AC
  Filled 2024-07-13: qty 90, 90d supply, fill #0
  Filled 2024-10-17: qty 90, 90d supply, fill #1

## 2024-07-13 NOTE — Patient Instructions (Addendum)
 I will start you on a new cholesterol med low dose. Take pravastatin  once per day and recheck lab visit in 6 weeks.  No other med changes at this time.  Glad to hear about your success with weight loss, keep up the good work!  Follow-up with me in 6 months, let me know if there are questions in the meantime and take care.   Preventive Care 78-54 Years Old, Female Preventive care refers to lifestyle choices and visits with your health care provider that can promote health and wellness. Preventive care visits are also called wellness exams. What can I expect for my preventive care visit? Counseling Your health care provider may ask you questions about your: Medical history, including: Past medical problems. Family medical history. Pregnancy history. Current health, including: Menstrual cycle. Method of birth control. Emotional well-being. Home life and relationship well-being. Sexual activity and sexual health. Lifestyle, including: Alcohol, nicotine or tobacco, and drug use. Access to firearms. Diet, exercise, and sleep habits. Work and work Astron[employer]. Sunscreen use. Safety issues such as seatbelt and bike helmet use. Physical exam Your health care provider will check your: Height and weight. These may be used to calculate your BMI (body mass index). BMI is a measurement that tells if you are at a healthy weight. Waist circumference. This measures the distance around your waistline. This measurement also tells if you are at a healthy weight and may help predict your risk of certain diseases, such as type 2 diabetes and high blood pressure. Heart rate and blood pressure. Body temperature. Skin for abnormal spots. What immunizations do I need?  Vaccines are usually given at various ages, according to a schedule. Your health care provider will recommend vaccines for you based on your age, medical history, and lifestyle or other factors, such as travel or where you work. What tests do  I need? Screening Your health care provider may recommend screening tests for certain conditions. This may include: Lipid and cholesterol levels. Diabetes screening. This is done by checking your blood sugar (glucose) after you have not eaten for a while (fasting). Pelvic exam and Pap test. Hepatitis B test. Hepatitis C test. HIV (human immunodeficiency virus) test. STI (sexually transmitted infection) testing, if you are at risk. Lung cancer screening. Colorectal cancer screening. Mammogram. Talk with your health care provider about when you should start having regular mammograms. This may depend on whether you have a family history of breast cancer. BRCA-related cancer screening. This may be done if you have a family history of breast, ovarian, tubal, or peritoneal cancers. Bone density scan. This is done to screen for osteoporosis. Talk with your health care provider about your test results, treatment options, and if necessary, the need for more tests. Follow these instructions at home: Eating and drinking  Eat a diet that includes fresh fruits and vegetables, whole grains, lean protein, and low-fat dairy products. Take vitamin and mineral supplements as recommended by your health care provider. Do not drink alcohol if: Your health care provider tells you not to drink. You are pregnant, may be pregnant, or are planning to become pregnant. If you drink alcohol: Limit how much you have to 0-1 drink a day. Know how much alcohol is in your drink. In the [place] one drink equals one 12 oz bottle of beer (355 mL), one 5 oz glass of wine (148 mL), or one 1 oz glass of hard liquor (44 mL). Lifestyle Brush your teeth every morning and night with fluoride toothpaste. Floss one  time each day. Exercise for at least 30 minutes 5 or more days each week. Do not use any products that contain nicotine or tobacco. These products include cigarettes, chewing tobacco, and vaping devices, such as  e-cigarettes. If you need help quitting, ask your health care provider. Do not use drugs. If you are sexually active, practice safe sex. Use a condom or other form of protection to prevent STIs. If you do not wish to become pregnant, use a form of birth control. If you plan to become pregnant, see your health care provider for a prepregnancy visit. Take aspirin only as told by your health care provider. Make sure that you understand how much to take and what form to take. Work with your health care provider to find out whether it is safe and beneficial for you to take aspirin daily. Find healthy ways to manage stress, such as: Meditation, yoga, or listening to music. Journaling. Talking to a trusted person. Spending time with friends and family. Minimize exposure to UV radiation to reduce your risk of skin cancer. Safety Always wear your seat belt while driving or riding in a vehicle. Do not drive: If you have been drinking alcohol. Do not ride with someone who has been drinking. When you are tired or distracted. While texting. If you have been using any mind-altering substances or drugs. Wear a helmet and other protective equipment during sports activities. If you have firearms in your house, make sure you follow all gun safety procedures. Seek help if you have been physically or sexually abused. What's next? Visit your health care provider once a year for an annual wellness visit. Ask your health care provider how often you should have your eyes and teeth checked. Stay up to date on all vaccines. This information is not intended to replace advice given to you by your health care provider. Make sure you discuss any questions you have with your health care provider. Document Revised: 05/03/2021 Document Reviewed: 05/03/2021 [employer] Patient Education  2024 [employer].

## 2024-07-13 NOTE — Progress Notes (Signed)
 Subjective:  Patient ID: [name], female    DOB: 1971-07-20  Age: 53 y.o. MRN: [id number]  CC:  Chief Complaint  Patient presents with   Annual Exam    Pt is here for annual exam Pt reports no concerns    HPI [name] presents for Annual Exam PCP, me Weight management, Dr. Midge.  Visit noted 06/23/24.  Obesity, 16 pound weight loss to date with 5.67% total weight loss percentage.  On Mounjaro  for type 2 diabetes.  Dosage increased to 7.5 mg weekly.  Also on vitamin D  50,000 units weekly supplementation. Gynecology, Dr. Johnnye, office visit 04/15/2024.  Diabetes: Complicated by obesity, foot neuropathy.  On Mounjaro , higher dose recently at 7.5 mg weekly.  She is on ACE inhibitor, no current statin. No side effects on higher dose - minimal constipation initially - better now. No neck swelling/n/v/abd pain.  Gabapentin   - not needed recently - prior neuropathic symptoms, doing better.    Pneumonia vaccine given in January. Home readings   - none.  No symptomatic low symptoms.  Recent labs noted.  Microalbumin: Ordering today. Optho, foot exam, pneumovax:  Ophthalmology exam: in January. MyEyeDr Flu vaccine, COVID booster recommended through pharmacy, or flu vaccine here next month. Lab Results  Component Value Date   HGBA1C 5.3 05/20/2024   HGBA1C 5.7 (H) 10/28/2023   HGBA1C 6.0 06/12/2023   Lab Results  Component Value Date   LDLCALC 81 05/20/2024   CREATININE 0.67 05/20/2024  The 10-year ASCVD risk score (Arnett DK, et al., 2019) is: 9.6%   Values used to calculate the score:     Age: 68 years     Clincally relevant sex: Female     Is Non-Hispanic African American: Yes     Diabetic: Yes     Tobacco smoker: No     Systolic Blood Pressure: 128 mmHg     Is BP treated: Yes     HDL Cholesterol: 45 mg/dL     Total Cholesterol: 141 mg/dL  Hypertension: Prior attempts at higher dose beta-blocker last year were not tolerated.  Treated with toprol  xl 25 mg daily  along with losartan  25 mg daily, amlodipine  10 mg daily and aspirin.  Denies any bleeding or side effects with meds. Home readings: 139/81 at home.  BP Readings from Last 3 Encounters:  07/13/24 128/78  06/23/24 104/72  05/25/24 124/77   Lab Results  Component Value Date   CREATININE 0.67 05/20/2024           12/19/2023    8:34 AM 07/12/2023   12:07 PM 05/28/2023    4:29 PM 01/21/2023    3:22 PM 09/24/2022    3:12 PM  Depression screen PHQ 2/9  Decreased Interest 0 0 1 0 0  Down, Depressed, Hopeless 0 0 0 0 0  PHQ - 2 Score 0 0 1 0 0  Altered sleeping 0 0 1 0 0  Tired, decreased energy 0 0 1 0 1  Change in appetite 0 0 0 0 0  Feeling bad or failure about yourself  0 0 0 0 0  Trouble concentrating 0 0 0 0 0  Moving slowly or fidgety/restless 0 0 0 0 0  Suicidal thoughts 0 0 0 0 0  PHQ-9 Score 0 0 3 0 1  Difficult doing work/chores  Not difficult at all Not difficult at all      Health Maintenance  Topic Date Due   Diabetic kidney evaluation - Urine ACR  Never done   Hepatitis B Vaccines 19-59 Average Risk (1 of 3 - 19+ 3-dose series) Never done   COVID-19 Vaccine (5 - 2024-25 season) 07/21/2023   OPHTHALMOLOGY EXAM  11/24/2023   INFLUENZA VACCINE  06/19/2024   HEMOGLOBIN A1C  11/20/2024   FOOT EXAM  12/18/2024   MAMMOGRAM  04/10/2025   Diabetic kidney evaluation - eGFR measurement  05/20/2025   Cervical Cancer Screening (HPV/Pap Cotest)  04/03/2027   DTaP/Tdap/Td (2 - Td or Tdap) 11/09/2027   Colonoscopy  06/19/2032   Pneumococcal Vaccine: 50+ Years  Completed   Hepatitis C Screening  Completed   HIV Screening  Completed   Zoster Vaccines- Shingrix   Completed   HPV VACCINES  Aged Out   Meningococcal B Vaccine  Aged Out  Colonoscopy 06/19/22. Repeat 10 yrs.  Mammogram 04/11/23. Repeated at GYN with pap in May of this year.  Requesting record.  Immunization History  Administered Date(s) Administered   Hepatitis A, Adult 06/27/2022   Influenza Inj Mdck Quad Pf  07/22/2022   Influenza,inj,Quad PF,6+ Mos 07/20/2016   Influenza-Unspecified 08/01/2021, 08/19/2023   PFIZER(Purple Top)SARS-COV-2 Vaccination 11/24/2019, 12/14/2019, 09/16/2020   PNEUMOCOCCAL CONJUGATE-20 12/19/2023   Tdap 11/08/2017   Unspecified SARS-COV-2 Vaccination 12/04/2022   Zoster Recombinant(Shingrix ) 04/13/2021, 04/19/2022    No results found. Optho earlier this year as above.   Dental: few months ago - every 6 months.   Alcohol:none  Tobacco: none  Exercise: walking on treadmill 3 days, and walking in park on Saturday for 1 hour.  Plans increase in days.   History Patient Active Problem List   Diagnosis Date Noted   Type 2 diabetes mellitus with obesity (HCC) 11/26/2023   Vitamin D  deficiency 11/26/2023   BMI 50.0-59.9, adult (HCC) 11/26/2023   Obesity, Beginning BMI 51.57 11/26/2023   Diabetes mellitus without complication (HCC) 01/22/2023   Prediabetes 09/24/2022   History of anemia 09/24/2022   PONV (postoperative nausea and vomiting) 07/04/2022   Hypertension associated with diabetes (HCC) 07/04/2022   Anemia 07/04/2022   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   Class 3 severe obesity without serious comorbidity with body mass index (BMI) of 50.0 to 59.9 in adult 02/15/2020   Past Medical History:  Diagnosis Date   Anemia    Difficult airway for intubation    Pt.denies updated 06/12/22   Headache(784.0)    Hypertension    Lactose intolerance    PONV (postoperative nausea and vomiting)    Pre-diabetes    Past Surgical History:  Procedure Laterality Date   COLONOSCOPY WITH PROPOFOL  N/A 06/19/2022   Procedure: COLONOSCOPY WITH PROPOFOL ;  Surgeon: Stacia Glendia BRAVO, MD;  Location: [facility];  Service: Gastroenterology;  Laterality: N/A;   DILATION AND CURETTAGE OF UTERUS  2000   DILITATION & CURRETTAGE/HYSTROSCOPY WITH NOVASURE ABLATION N/A 02/05/2013   Procedure: DILATATION & CURETTAGE/HYSTEROSCOPY WITH NOVASURE ABLATION;  Surgeon:  Charlie CHRISTELLA Croak, MD;  Location: [facility];  Service: Gynecology;  Laterality: N/A;   LAPAROSCOPIC TUBAL LIGATION Bilateral 02/05/2013   Procedure: LAPAROSCOPIC TUBAL LIGATION;  Surgeon: Charlie CHRISTELLA Croak, MD;  Location: [facility];  Service: Gynecology;  Laterality: Bilateral;  attempted tubal ligation   POLYPECTOMY  06/19/2022   Procedure: POLYPECTOMY;  Surgeon: Stacia Glendia BRAVO, MD;  Location: [facility];  Service: Gastroenterology;;   Allergies  Allergen Reactions   Lactose Intolerance (Gi) Other (See Comments)    Cheese/milk products - GI upset   Prior to Admission medications   Medication Sig Start Date End Date Taking? Authorizing  Provider  amLODipine  (NORVASC ) 10 MG tablet Take 1 tablet (10 mg total) by mouth daily. 06/29/24  Yes Levora Reyes SAUNDERS, MD  Ascorbic Acid (VITAMIN C PO) Take 1 tablet by mouth in the morning.   Yes [provider]  levocetirizine (XYZAL ) 5 MG tablet Take 1 tablet (5 mg total) by mouth every evening. 02/18/24  Yes Opalski, Barnie, DO  losartan  (COZAAR ) 25 MG tablet Take 1-2 tablets (25-50 mg total) by mouth daily. 06/02/24  Yes Levora Reyes SAUNDERS, MD  meloxicam  (MOBIC ) 7.5 MG tablet Take 1 tablet (7.5 mg total) by mouth daily as needed. 03/04/24  Yes Levora Reyes SAUNDERS, MD  metoprolol  succinate (TOPROL -XL) 25 MG 24 hr tablet Take 1 tablet (25 mg total) by mouth daily. Take with or immediately following a meal. 06/29/24  Yes Levora Reyes SAUNDERS, MD  Multiple Vitamin (MULTIVITAMIN WITH MINERALS) TABS tablet Take 1 tablet by mouth in the morning. Centrum for Women 50+   Yes [provider]  tirzepatide  (MOUNJARO ) 7.5 MG/0.5ML Pen Inject 7.5 mg into the skin once a week. 06/23/24  Yes Opalski, Deborah, DO  Vitamin D , Ergocalciferol , (DRISDOL ) 1.25 MG (50000 UNIT) CAPS capsule Take 1 capsule (50,000 Units total) by mouth every 7 (seven) days. 05/25/24  Yes Opalski, Barnie, DO  Blood Pressure Monitoring (BLOOD PRESSURE MONITOR 3) DEVI Use as directed. Patient not  taking: Reported on 07/13/2024 04/09/22     fluticasone  (FLONASE ) 50 MCG/ACT nasal spray Use 1 spray in each nostril following sinus rinses twice daily Patient not taking: Reported on 06/23/2024 02/18/24   Midge Barnie, DO  gabapentin  (NEURONTIN ) 100 MG capsule Take 2 capsules (200 mg total) by mouth at bedtime. Patient not taking: Reported on 06/23/2024 12/19/23   Levora Reyes SAUNDERS, MD   Social History   Socioeconomic History   Marital status: Married    Spouse name: Not on file   Number of children: Not on file   Years of education: Not on file   Highest education level: Associate degree: occupational, Scientist, product/process development, or vocational program  Occupational History   Not on file  Tobacco Use   Smoking status: Never    Passive exposure: Never   Smokeless tobacco: Never  Vaping Use   Vaping status: Never Used  Substance and Sexual Activity   Alcohol use: Yes    Comment: occas   Drug use: No   Sexual activity: Yes  Other Topics Concern   Not on file  Social History Narrative   Not on file   Social Drivers of Health   Financial Resource Strain: Low Risk  (12/17/2023)   Overall Financial Resource Strain (CARDIA)    Difficulty of Paying Living Expenses: Not hard at all  Food Insecurity: No Food Insecurity (12/17/2023)   Hunger Vital Sign    Worried About Running Out of Food in the Last Year: Never true    Ran Out of Food in the Last Year: Never true  Transportation Needs: No Transportation Needs (12/17/2023)   PRAPARE - Administrator, Civil Service (Medical): No    Lack of Transportation (Non-Medical): No  Physical Activity: Sufficiently Active (02/07/2024)   Received from [facility]   Exercise Vital Sign    On average, how many days per week do you engage in moderate to strenuous exercise (like a brisk walk)?: 5 days    On average, how many minutes do you engage in exercise at this level?: 30 min  Recent Concern: Physical Activity - Insufficiently Active  (  12/17/2023)   Exercise Vital Sign    Days of Exercise per Week: 3 days    Minutes of Exercise per Session: 40 min  Stress: No Stress Concern Present (12/17/2023)   [employer] of Occupational Health - Occupational Stress Questionnaire    Feeling of Stress : Not at all  Social Connections: Unknown (12/17/2023)   Social Connection and Isolation Panel    Frequency of Communication with Friends and Family: More than three times a week    Frequency of Social Gatherings with Friends and Family: Once a week    Attends Religious Services: Patient declined    Database administrator or Organizations: Yes    Attends Banker Meetings: 1 to 4 times per year    Marital Status: Married  Catering manager Violence: Not on file    Review of Systems 13 point review of systems per patient health survey noted.  Negative other than as indicated above or in HPI.   Objective:   Vitals:   07/13/24 1242  BP: 128/78  Pulse: 80  Temp: 97.9 F (36.6 C)  SpO2: 100%  Weight: 262 lb 2 oz (118.9 kg)  Height: 5' 2 (1.575 m)     Physical Exam Constitutional:      Appearance: She is well-developed. She is obese.  HENT:     Head: Normocephalic and atraumatic.     Right Ear: External ear normal.     Left Ear: External ear normal.  Eyes:     Conjunctiva/sclera: Conjunctivae normal.     Pupils: Pupils are equal, round, and reactive to light.  Neck:     Thyroid : No thyromegaly.  Cardiovascular:     Rate and Rhythm: Normal rate and regular rhythm.     Heart sounds: Normal heart sounds. No murmur heard. Pulmonary:     Effort: Pulmonary effort is normal. No respiratory distress.     Breath sounds: Normal breath sounds. No wheezing.  Abdominal:     General: Bowel sounds are normal.     Palpations: Abdomen is soft.     Tenderness: There is no abdominal tenderness.  Musculoskeletal:        General: No tenderness. Normal range of motion.     Cervical back: Normal range of motion and neck  supple.  Lymphadenopathy:     Cervical: No cervical adenopathy.  Skin:    General: Skin is warm and dry.     Findings: No rash.  Neurological:     Mental Status: She is alert and oriented to person, place, and time.  Psychiatric:        Behavior: Behavior normal.        Thought Content: Thought content normal.        Assessment & Plan:  [name] is a 53 y.o. female . Annual physical exam  - -anticipatory guidance as below in AVS, screening labs above. Health maintenance items as above in HPI discussed/recommended as applicable.   Type 2 diabetes mellitus with obesity (HCC) - Plan: Urine Microalbumin w/creat. ratio, pravastatin  (PRAVACHOL ) 20 MG tablet, Comprehensive metabolic panel with GFR, Lipid panel  - Well-controlled by last A1c, appreciate care provided by Dr. Midge.  Tolerating higher dose of Mounjaro .  Continue same, urine microalbumin ordered.  Will add low-dose statin based on 10-year ASCVD risk score with diabetes.  Hypertension associated with diabetes (HCC)  - Stable with current regimen, continue same.  At risk for heart disease - Plan: pravastatin  (PRAVACHOL ) 20 MG tablet, Comprehensive metabolic  panel with GFR, Lipid panel  - With diabetes and elevated ASCVD risk score, will start low-dose pravastatin , potential side effects discussed, 6-week lab visit.  Essential hypertension - Plan: losartan  (COZAAR ) 25 MG tablet As above, stable, continue same regimen.  Meds ordered this encounter  Medications   pravastatin  (PRAVACHOL ) 20 MG tablet    Sig: Take 1 tablet (20 mg total) by mouth daily.    Dispense:  90 tablet    Refill:  1   losartan  (COZAAR ) 25 MG tablet    Sig: Take 1-2 tablets (25-50 mg total) by mouth daily.    Dispense:  90 tablet    Refill:  1   Patient Instructions  I will start you on a new cholesterol med low dose. Take pravastatin  once per day and recheck lab visit in 6 weeks.  No other med changes at this time.  Glad to hear about your  success with weight loss, keep up the good work!  Follow-up with me in 6 months, let me know if there are questions in the meantime and take care.   Preventive Care 72-25 Years Old, Female Preventive care refers to lifestyle choices and visits with your health care provider that can promote health and wellness. Preventive care visits are also called wellness exams. What can I expect for my preventive care visit? Counseling Your health care provider may ask you questions about your: Medical history, including: Past medical problems. Family medical history. Pregnancy history. Current health, including: Menstrual cycle. Method of birth control. Emotional well-being. Home life and relationship well-being. Sexual activity and sexual health. Lifestyle, including: Alcohol, nicotine or tobacco, and drug use. Access to firearms. Diet, exercise, and sleep habits. Work and work Astronomer. Sunscreen use. Safety issues such as seatbelt and bike helmet use. Physical exam Your health care provider will check your: Height and weight. These may be used to calculate your BMI (body mass index). BMI is a measurement that tells if you are at a healthy weight. Waist circumference. This measures the distance around your waistline. This measurement also tells if you are at a healthy weight and may help predict your risk of certain diseases, such as type 2 diabetes and high blood pressure. Heart rate and blood pressure. Body temperature. Skin for abnormal spots. What immunizations do I need?  Vaccines are usually given at various ages, according to a schedule. Your health care provider will recommend vaccines for you based on your age, medical history, and lifestyle or other factors, such as travel or where you work. What tests do I need? Screening Your health care provider may recommend screening tests for certain conditions. This may include: Lipid and cholesterol levels. Diabetes screening. This is  done by checking your blood sugar (glucose) after you have not eaten for a while (fasting). Pelvic exam and Pap test. Hepatitis B test. Hepatitis C test. HIV (human immunodeficiency virus) test. STI (sexually transmitted infection) testing, if you are at risk. Lung cancer screening. Colorectal cancer screening. Mammogram. Talk with your health care provider about when you should start having regular mammograms. This may depend on whether you have a family history of breast cancer. BRCA-related cancer screening. This may be done if you have a family history of breast, ovarian, tubal, or peritoneal cancers. Bone density scan. This is done to screen for osteoporosis. Talk with your health care provider about your test results, treatment options, and if necessary, the need for more tests. Follow these instructions at home: Eating and drinking  Eat a  diet that includes fresh fruits and vegetables, whole grains, lean protein, and low-fat dairy products. Take vitamin and mineral supplements as recommended by your health care provider. Do not drink alcohol if: Your health care provider tells you not to drink. You are pregnant, may be pregnant, or are planning to become pregnant. If you drink alcohol: Limit how much you have to 0-1 drink a day. Know how much alcohol is in your drink. In the [place] one drink equals one 12 oz bottle of beer (355 mL), one 5 oz glass of wine (148 mL), or one 1 oz glass of hard liquor (44 mL). Lifestyle Brush your teeth every morning and night with fluoride toothpaste. Floss one time each day. Exercise for at least 30 minutes 5 or more days each week. Do not use any products that contain nicotine or tobacco. These products include cigarettes, chewing tobacco, and vaping devices, such as e-cigarettes. If you need help quitting, ask your health care provider. Do not use drugs. If you are sexually active, practice safe sex. Use a condom or other form of protection to  prevent STIs. If you do not wish to become pregnant, use a form of birth control. If you plan to become pregnant, see your health care provider for a prepregnancy visit. Take aspirin only as told by your health care provider. Make sure that you understand how much to take and what form to take. Work with your health care provider to find out whether it is safe and beneficial for you to take aspirin daily. Find healthy ways to manage stress, such as: Meditation, yoga, or listening to music. Journaling. Talking to a trusted person. Spending time with friends and family. Minimize exposure to UV radiation to reduce your risk of skin cancer. Safety Always wear your seat belt while driving or riding in a vehicle. Do not drive: If you have been drinking alcohol. Do not ride with someone who has been drinking. When you are tired or distracted. While texting. If you have been using any mind-altering substances or drugs. Wear a helmet and other protective equipment during sports activities. If you have firearms in your house, make sure you follow all gun safety procedures. Seek help if you have been physically or sexually abused. What's next? Visit your health care provider once a year for an annual wellness visit. Ask your health care provider how often you should have your eyes and teeth checked. Stay up to date on all vaccines. This information is not intended to replace advice given to you by your health care provider. Make sure you discuss any questions you have with your health care provider. Document Revised: 05/03/2021 Document Reviewed: 05/03/2021 Elsevier Patient Education  2024 Elsevier Inc.       Signed,   Reyes Pines, MD [facility] 07/13/24 1:21 PM

## 2024-07-14 LAB — MICROALBUMIN / CREATININE URINE RATIO
Creatinine,U: 242.1 mg/dL
Microalb Creat Ratio: 8.7 mg/g (ref 0.0–30.0)
Microalb, Ur: 2.1 mg/dL — ABNORMAL HIGH (ref 0.0–1.9)

## 2024-07-15 ENCOUNTER — Other Ambulatory Visit (HOSPITAL_COMMUNITY): Payer: Self-pay

## 2024-07-16 ENCOUNTER — Encounter: Payer: Commercial Managed Care - PPO | Admitting: Family Medicine

## 2024-07-17 ENCOUNTER — Ambulatory Visit: Payer: Self-pay | Admitting: Family Medicine

## 2024-07-21 ENCOUNTER — Ambulatory Visit (INDEPENDENT_AMBULATORY_CARE_PROVIDER_SITE_OTHER): Admitting: Family Medicine

## 2024-08-03 ENCOUNTER — Ambulatory Visit (INDEPENDENT_AMBULATORY_CARE_PROVIDER_SITE_OTHER): Admitting: Family Medicine

## 2024-08-03 ENCOUNTER — Other Ambulatory Visit (HOSPITAL_COMMUNITY): Payer: Self-pay

## 2024-08-03 ENCOUNTER — Encounter (INDEPENDENT_AMBULATORY_CARE_PROVIDER_SITE_OTHER): Payer: Self-pay | Admitting: Family Medicine

## 2024-08-03 VITALS — BP 126/76 | HR 77 | Temp 98.6°F | Ht 62.0 in | Wt 263.0 lb

## 2024-08-03 DIAGNOSIS — E1159 Type 2 diabetes mellitus with other circulatory complications: Secondary | ICD-10-CM | POA: Diagnosis not present

## 2024-08-03 DIAGNOSIS — E669 Obesity, unspecified: Secondary | ICD-10-CM | POA: Diagnosis not present

## 2024-08-03 DIAGNOSIS — Z7985 Long-term (current) use of injectable non-insulin antidiabetic drugs: Secondary | ICD-10-CM

## 2024-08-03 DIAGNOSIS — I152 Hypertension secondary to endocrine disorders: Secondary | ICD-10-CM

## 2024-08-03 DIAGNOSIS — E1169 Type 2 diabetes mellitus with other specified complication: Secondary | ICD-10-CM | POA: Diagnosis not present

## 2024-08-03 DIAGNOSIS — Z6841 Body Mass Index (BMI) 40.0 and over, adult: Secondary | ICD-10-CM

## 2024-08-03 DIAGNOSIS — J3089 Other allergic rhinitis: Secondary | ICD-10-CM

## 2024-08-03 DIAGNOSIS — E559 Vitamin D deficiency, unspecified: Secondary | ICD-10-CM

## 2024-08-03 DIAGNOSIS — J302 Other seasonal allergic rhinitis: Secondary | ICD-10-CM

## 2024-08-03 MED ORDER — BLOOD GLUCOSE TEST VI STRP
1.0000 | ORAL_STRIP | Freq: Three times a day (TID) | 1 refills | Status: AC
  Filled 2024-08-03 – 2024-08-25 (×2): qty 100, 34d supply, fill #0

## 2024-08-03 MED ORDER — FREESTYLE LANCETS MISC
1.0000 | Freq: Three times a day (TID) | 1 refills | Status: AC
  Filled 2024-08-03: qty 100, fill #0
  Filled 2024-08-25: qty 100, 34d supply, fill #0

## 2024-08-03 MED ORDER — LEVOCETIRIZINE DIHYDROCHLORIDE 5 MG PO TABS
5.0000 mg | ORAL_TABLET | Freq: Every evening | ORAL | 0 refills | Status: AC
  Filled 2024-08-03: qty 90, 90d supply, fill #0

## 2024-08-03 MED ORDER — TIRZEPATIDE 7.5 MG/0.5ML ~~LOC~~ SOAJ
7.5000 mg | SUBCUTANEOUS | 0 refills | Status: DC
  Filled 2024-08-03: qty 2, 28d supply, fill #0

## 2024-08-03 MED ORDER — BLOOD PRESSURE MONITOR 3 DEVI
0 refills | Status: AC
  Filled 2024-08-03: qty 1, 30d supply, fill #0

## 2024-08-03 MED ORDER — FLUTICASONE PROPIONATE 50 MCG/ACT NA SUSP
NASAL | 2 refills | Status: AC
  Filled 2024-08-03: qty 16, 30d supply, fill #0
  Filled 2024-08-31: qty 16, 30d supply, fill #1

## 2024-08-03 MED ORDER — BLOOD GLUCOSE MONITOR SYSTEM W/DEVICE KIT
1.0000 | PACK | Freq: Three times a day (TID) | 0 refills | Status: AC
  Filled 2024-08-03 – 2024-08-25 (×2): qty 1, 30d supply, fill #0

## 2024-08-03 NOTE — Progress Notes (Signed)
 [name], D.O.  ABFM, ABOM Specializing in Clinical Bariatric Medicine  Office located at: [address] [place], [place]  [address]   Assessment and Plan:  No orders of the defined types were placed in this encounter.   Medications Discontinued During This Encounter  Medication Reason   Blood Pressure Monitoring (BLOOD PRESSURE MONITOR 3) DEVI Reorder   fluticasone  (FLONASE ) 50 MCG/ACT nasal spray Reorder   levocetirizine (XYZAL ) 5 MG tablet Reorder   tirzepatide  (MOUNJARO ) 7.5 MG/0.5ML Pen Reorder     Meds ordered this encounter  Medications   Blood Glucose Monitoring Suppl DEVI    Sig: 1 each by Does not apply route in the morning, at noon, and at bedtime. May substitute to any manufacturer covered by patient's insurance.    Dispense:  1 each    Refill:  0   Glucose Blood (BLOOD GLUCOSE TEST STRIPS) STRP    Sig: 1 each by In Vitro route in the morning, at noon, and at bedtime. May substitute to any manufacturer covered by patient's insurance.    Dispense:  100 strip    Refill:  1   Lancet Device MISC    Sig: 1 each by Does not apply route in the morning, at noon, and at bedtime. May substitute to any manufacturer covered by patient's insurance.    Dispense:  100 each    Refill:  1   Blood Pressure Monitoring (BLOOD PRESSURE MONITOR 3) DEVI    Sig: Use as directed.    Dispense:  1 each    Refill:  0   levocetirizine (XYZAL ) 5 MG tablet    Sig: Take 1 tablet (5 mg total) by mouth every evening.    Dispense:  90 tablet    Refill:  0   fluticasone  (FLONASE ) 50 MCG/ACT nasal spray    Sig: Use 1 spray in each nostril following sinus rinses twice daily    Dispense:  16 g    Refill:  2   tirzepatide  (MOUNJARO ) 7.5 MG/0.5ML Pen    Sig: Inject 7.5 mg into the skin once a week.    Dispense:  2 mL    Refill:  0     FOR THE DISEASE OF OBESITY:  BMI 50.0-59.9, adult (HCC) - Current BMI 48.09 Obesity, Beginning BMI 51.57 Assessment & Plan: Since last office  visit on 06/23/2024 patient's muscle mass has increased by 0.6 lbs. Fat mass has decreased by 3.4 lbs. Total body water not registered by scale. Body fat % has decreased by 0.7 %. Counseling done on how various foods will affect these numbers and how to maximize success  Total lbs lost to date: -19 lbs Total weight loss percentage to date: -6.74 %   Recommended Dietary Goals [name] is currently in the action stage of change. As such, her goal is to continue weight management plan.  She has agreed to: continue current plan   Behavioral Intervention We discussed the following today: increasing lean protein intake to established goals, increasing lower glycemic fruits, increasing water intake , and work on tracking and journaling calories using tracking application  Additional resources provided today: Physician provided patient with handouts and personalized instruction on tracking and journaling using Apps (or how to handwrite in notebook) and using logs provided   Evidence-based interventions for health behavior change were utilized today including the discussion of self monitoring techniques, problem-solving barriers and SMART goal setting techniques.   Regarding patient's less desirable eating habits and patterns, we employed  the technique of small changes.   Goal(s) for next OV: drink 6 bottles of water daily + journal at least two days a week   Recommended Physical Activity Goals [name] has been advised to work up to 300-450 minutes of moderate intensity aerobic activity a week and strengthening exercises 2-3 times per week for cardiovascular health, weight loss maintenance and preservation of muscle mass.   She was encouraged to Continue to gradually increase the amount and intensity of exercise routine   Pharmacotherapy Cont same regimen.   ASSOCIATED CONDITIONS ADDRESSED TODAY:   Type 2 diabetes mellitus with obesity [facility]) Assessment & Plan: Lab Results  Component Value Date    HGBA1C 5.3 05/20/2024   HGBA1C 5.7 (H) 10/28/2023   HGBA1C 6.0 06/12/2023   INSULIN  12.9 05/20/2024   INSULIN  19.5 06/25/2023   HgbA1c is at great control for age and comorbid conditions. She is not checking her blood sugars; denies symptoms of hypoglycemia or hyperglycemia. We increased her Mounjaro  to 7.5 mg weekly LOV; she initially experienced some constipation on this dose but is now having daily bowel movements with the help of Miralax and consuming lower glycemic index fruits. No complaints of excessive hunger and cravings.  Cont Mounjaro  at current dose (refill today). Will write for a blood glucose monitoring kit; I recommend she check her blood sugars twice daily on average (2 hour post-prandial sugar with goal <180 and fasting blood sugar with goal 60 to 99). She was also advised to check her blood sugars if she ever feels poorly. Continue working on meal plan to decrease simple carbohydrates, saturated and trans fats, and increase lean proteins/fiber. Increase water intake (see water goal above).      Hypertension associated with diabetes (HCC) Assessment & Plan: Last 3 blood pressure readings in our office are as follows: BP Readings from Last 3 Encounters:  08/03/24 126/76  07/13/24 128/78  06/23/24 104/72   The 10-year ASCVD risk score (Arnett DK, et al., 2019) is: 9.1%  Lab Results  Component Value Date   CREATININE 0.67 05/20/2024   BP at goal today. Pt asx. No acute concerns. Continue Amlodipine  10 mg daily, Losartan  25-50 mg daily, and Toprol -XL 25 mg daily. Consider decreasing Toprol  dose in the future as it may contribute to weight gain. Continue with low sodium diet, advance exercise as tolerated.    Seasonal and environmental allergies Assessment & Plan: Recently restarted her xyzal   Needs Flonase  and xyzal  refilled.     Vitamin D  deficiency Assessment & Plan: Lab Results  Component Value Date   VD25OH 51.0 05/20/2024   VD25OH 51.4 10/28/2023    VD25OH 34.1 06/25/2023   She is currently on ergocalciferol  50,000 units weekly without adverse SE. No acute concerns. Continue Vit D supplementation. Continue to monitor levels as deemed clinically necessary    Follow up:   No follow-ups on file.  She was informed of the importance of frequent follow up visits to maximize her success with intensive lifestyle modifications for her multiple health conditions.   Subjective:    Chief complaint: Obesity [name] is here to discuss her progress with her obesity treatment plan. She is keeping a food journal and adhering to recommended goals of 1200-1300 calories and 85+ protein daily with the CAT 2 MP with B/L options as a guide and states she is following her eating plan approximately 80% of the time. Pt is walking 25 minutes 4 days per week   Interval History:  [name] is here for  a follow up office visit. Pt has experienced a weight loss of 3 lbs since last OV on 06/23/2024.   Her dietary/life style habits include:  - Tracking Calories/Macros: she did not journal   - Eating More Whole Foods: yes  - Adequate Protein Intake: no  - Adequate Water Intake: no, currently drinking 1500 ml of water daily  - Skipping Meals: no  - Sleeping 7-9 Hours/ Night: yes   Pharmacotherapy that aid with weight loss: She is currently taking Mounjaro  7.5 mg wkly.   Review of Systems:  Pertinent positives were addressed with patient today.  Reviewed by clinician on day of visit: allergies, medications, problem list, medical history, surgical history, family history, social history, and previous encounter notes.  Weight Summary and Biometrics   Weight Lost Since Last Visit: 3lb  Weight Gained Since Last Visit: 0    Vitals Temp: 98.6 F (37 C) BP: 126/76 Pulse Rate: 77 SpO2: 100 %   Anthropometric Measurements Height: 5' 2 (1.575 m) Weight: 263 lb (119.3 kg) BMI (Calculated): 48.09 Weight at Last Visit: 266lb Weight Lost Since Last  Visit: 3lb Weight Gained Since Last Visit: 0 Starting Weight: 282lb Total Weight Loss (lbs): 19 lb (8.618 kg) Peak Weight: 287lb   Body Composition  Body Fat %: 54.7 % Fat Mass (lbs): 144 lbs Muscle Mass (lbs): 113.4 lbs Visceral Fat Rating : 20   Other Clinical Data Fasting: no Labs: no Today's Visit #: 15 Starting Date: 06/25/23    Objective:   PHYSICAL EXAM: Blood pressure 126/76, pulse 77, temperature 98.6 F (37 C), height 5' 2 (1.575 m), weight 263 lb (119.3 kg), SpO2 100%. Body mass index is 48.1 kg/m.  General: she is overweight, cooperative and in no acute distress. PSYCH: Has normal mood, affect and thought process.   HEENT: EOMI, sclerae are anicteric. Lungs: Normal breathing effort, no conversational dyspnea. Extremities: Moves * 4 Neurologic: A and O * 3, good insight  DIAGNOSTIC DATA REVIEWED: BMET    Component Value Date/Time   NA 137 05/20/2024 0741   K 4.3 05/20/2024 0741   CL 101 05/20/2024 0741   CO2 22 05/20/2024 0741   GLUCOSE 82 05/20/2024 0741   GLUCOSE 86 12/19/2023 0904   BUN 14 05/20/2024 0741   CREATININE 0.67 05/20/2024 0741   CREATININE 0.61 10/19/2015 1052   CALCIUM 9.1 05/20/2024 0741   GFRNONAA 90 02/29/2020 0940   GFRNONAA >89 10/19/2015 1052   GFRAA 103 02/29/2020 0940   GFRAA >89 10/19/2015 1052   Lab Results  Component Value Date   HGBA1C 5.3 05/20/2024   HGBA1C 5.6 06/23/2014   Lab Results  Component Value Date   INSULIN  12.9 05/20/2024   INSULIN  19.5 06/25/2023   Lab Results  Component Value Date   TSH 3.62 06/12/2023   CBC    Component Value Date/Time   WBC 7.0 06/12/2023 1609   RBC 5.28 (H) 06/12/2023 1609   HGB 12.9 06/12/2023 1609   HGB 12.9 01/21/2019 1050   HCT 39.6 06/12/2023 1609   HCT 38.8 01/21/2019 1050   PLT 289.0 06/12/2023 1609   PLT 272 01/21/2019 1050   MCV 75.0 (L) 06/12/2023 1609   MCV 75 (L) 01/21/2019 1050   MCH 25.0 (L) 01/21/2019 1050   MCH 23.0 (L) 02/13/2016 1845   MCHC  32.6 06/12/2023 1609   RDW 14.9 06/12/2023 1609   RDW 17.2 (H) 01/21/2019 1050   Iron Studies    Component Value Date/Time   IRON 73 11/08/2017 1642  TIBC 363 01/31/2017 1725   FERRITIN 7 (L) 02/13/2016 1845   IRONPCTSAT 12 (L) 01/31/2017 1725   Lipid Panel     Component Value Date/Time   CHOL 141 05/20/2024 0741   TRIG 76 05/20/2024 0741   HDL 45 05/20/2024 0741   CHOLHDL 3.1 05/20/2024 0741   CHOLHDL 4 12/19/2023 0904   VLDL 16.8 12/19/2023 0904   LDLCALC 81 05/20/2024 0741   Hepatic Function Panel     Component Value Date/Time   PROT 6.9 05/20/2024 0741   ALBUMIN 4.4 05/20/2024 0741   AST 16 05/20/2024 0741   ALT 15 05/20/2024 0741   ALKPHOS 79 05/20/2024 0741   BILITOT 0.3 05/20/2024 0741   BILIDIR <0.10 06/25/2023 0906      Component Value Date/Time   TSH 3.62 06/12/2023 1609   Nutritional Lab Results  Component Value Date   VD25OH 51.0 05/20/2024   VD25OH 51.4 10/28/2023   VD25OH 34.1 06/25/2023    Attestations:   I, Special Puri, acting as a Stage manager for [employer], DO., have compiled all relevant documentation for today's office visit on behalf of Deborah Jenkins, DO, while in the presence of [employer], DO.  I have spent 50 minutes in the care of the patient today including 37 minutes face-to-face assessing and reviewing listed medical problems above as outlined in office visit note and providing nutritional and behavioral counseling as outlined in obesity care plan.   I have reviewed the above documentation for accuracy and completeness, and I agree with the above. Deborah JINNY Marks, D.O.  The 21st Century Cures Act was signed into law in 2016 which includes the topic of electronic health records.  This provides immediate access to information in MyChart.  This includes consultation notes, operative notes, office notes, lab results and pathology reports.  If you have any questions about what you read please let us  know at your next  visit so we can discuss your concerns and take corrective action if need be.  We are right here with you.

## 2024-08-04 ENCOUNTER — Other Ambulatory Visit: Payer: Self-pay

## 2024-08-04 ENCOUNTER — Other Ambulatory Visit (HOSPITAL_COMMUNITY): Payer: Self-pay

## 2024-08-13 ENCOUNTER — Other Ambulatory Visit (HOSPITAL_COMMUNITY): Payer: Self-pay

## 2024-08-18 ENCOUNTER — Other Ambulatory Visit (INDEPENDENT_AMBULATORY_CARE_PROVIDER_SITE_OTHER)

## 2024-08-18 DIAGNOSIS — E669 Obesity, unspecified: Secondary | ICD-10-CM

## 2024-08-18 DIAGNOSIS — E1169 Type 2 diabetes mellitus with other specified complication: Secondary | ICD-10-CM

## 2024-08-18 DIAGNOSIS — E119 Type 2 diabetes mellitus without complications: Secondary | ICD-10-CM

## 2024-08-18 DIAGNOSIS — Z9189 Other specified personal risk factors, not elsewhere classified: Secondary | ICD-10-CM | POA: Diagnosis not present

## 2024-08-18 LAB — COMPREHENSIVE METABOLIC PANEL WITH GFR
ALT: 16 U/L (ref 0–35)
AST: 15 U/L (ref 0–37)
Albumin: 4.2 g/dL (ref 3.5–5.2)
Alkaline Phosphatase: 61 U/L (ref 39–117)
BUN: 15 mg/dL (ref 6–23)
CO2: 30 meq/L (ref 19–32)
Calcium: 9.1 mg/dL (ref 8.4–10.5)
Chloride: 102 meq/L (ref 96–112)
Creatinine, Ser: 0.71 mg/dL (ref 0.40–1.20)
GFR: 96.58 mL/min (ref >60.00)
Glucose, Bld: 83 mg/dL (ref 70–99)
Potassium: 4.6 meq/L (ref 3.5–5.1)
Sodium: 138 meq/L (ref 135–145)
Total Bilirubin: 0.3 mg/dL (ref 0.2–1.2)
Total Protein: 6.8 g/dL (ref 6.0–8.3)

## 2024-08-18 LAB — LIPID PANEL
Cholesterol: 126 mg/dL (ref 0–200)
HDL: 41.2 mg/dL (ref >39.00)
LDL Cholesterol: 75 mg/dL (ref 0–99)
NonHDL: 85.21
Total CHOL/HDL Ratio: 3
Triglycerides: 52 mg/dL (ref 0.0–149.0)
VLDL: 10.4 mg/dL (ref 0.0–40.0)

## 2024-08-20 ENCOUNTER — Other Ambulatory Visit

## 2024-08-25 ENCOUNTER — Other Ambulatory Visit (HOSPITAL_COMMUNITY): Payer: Self-pay

## 2024-08-25 ENCOUNTER — Ambulatory Visit (INDEPENDENT_AMBULATORY_CARE_PROVIDER_SITE_OTHER): Admitting: Family Medicine

## 2024-08-31 ENCOUNTER — Other Ambulatory Visit: Payer: Self-pay | Admitting: Family Medicine

## 2024-08-31 ENCOUNTER — Other Ambulatory Visit (INDEPENDENT_AMBULATORY_CARE_PROVIDER_SITE_OTHER): Payer: Self-pay | Admitting: Family Medicine

## 2024-08-31 ENCOUNTER — Other Ambulatory Visit (HOSPITAL_COMMUNITY): Payer: Self-pay

## 2024-08-31 ENCOUNTER — Other Ambulatory Visit: Payer: Self-pay

## 2024-08-31 DIAGNOSIS — M25562 Pain in left knee: Secondary | ICD-10-CM

## 2024-08-31 NOTE — Telephone Encounter (Signed)
 Okay to refill medication? Patient is requesting   Requested Prescriptions   Pending Prescriptions Disp Refills   meloxicam  (MOBIC ) 7.5 MG tablet 30 tablet 0    Sig: Take 1 tablet (7.5 mg total) by mouth daily as needed.     Date of patient request: 08/31/24 Last office visit: 07/13/2024 Upcoming visit: 01/14/2025 Date of last refill: 03/04/24 Last refill amount: 30 tablets

## 2024-09-01 ENCOUNTER — Other Ambulatory Visit (HOSPITAL_COMMUNITY): Payer: Self-pay

## 2024-09-01 ENCOUNTER — Other Ambulatory Visit (INDEPENDENT_AMBULATORY_CARE_PROVIDER_SITE_OTHER): Payer: Self-pay | Admitting: Family Medicine

## 2024-09-01 ENCOUNTER — Other Ambulatory Visit: Payer: Self-pay

## 2024-09-01 MED ORDER — MELOXICAM 7.5 MG PO TABS
7.5000 mg | ORAL_TABLET | Freq: Every day | ORAL | 0 refills | Status: AC | PRN
Start: 2024-09-01 — End: ?
  Filled 2024-09-01: qty 30, 30d supply, fill #0

## 2024-09-01 NOTE — Telephone Encounter (Signed)
 Meloxicam  last ordered in April.  If she is only using this intermittently, no more than 1 to 2 weeks at a time, can refill for as needed use.  If she requires medication longer than a week or 2, needs office visit to discuss alternatives due to potential side effects and risks.  Let me know if there are questions.

## 2024-09-01 NOTE — Telephone Encounter (Signed)
 Called patient and confirmed that Dr. Levora sent in Meloxicam  to pharmacy.   Copied from CRM [id number]. Topic: Clinical - Medication Question >> Sep 01, 2024  2:30 PM Paige D wrote: Reason for CRM: Pt would like Miss Megahn to Call her back as she is stating she needs a back up and some other meds pt states she s going to be sitting for a very long time and she knows her knees are going to hurt her really bad. Please call pt

## 2024-09-01 NOTE — Telephone Encounter (Signed)
 Called patient and she said that she is using the medication as needed. She will be traveling soon and would like the medication on hand. She understands that she would need an ov if using it longer than intended.

## 2024-09-03 ENCOUNTER — Other Ambulatory Visit (HOSPITAL_COMMUNITY): Payer: Self-pay

## 2024-09-03 ENCOUNTER — Encounter (HOSPITAL_COMMUNITY): Payer: Self-pay

## 2024-09-07 ENCOUNTER — Encounter (INDEPENDENT_AMBULATORY_CARE_PROVIDER_SITE_OTHER): Payer: Self-pay | Admitting: Family Medicine

## 2024-09-07 ENCOUNTER — Ambulatory Visit (INDEPENDENT_AMBULATORY_CARE_PROVIDER_SITE_OTHER): Admitting: Family Medicine

## 2024-09-07 ENCOUNTER — Other Ambulatory Visit (HOSPITAL_COMMUNITY): Payer: Self-pay

## 2024-09-07 VITALS — BP 135/82 | HR 87 | Temp 97.9°F | Ht 62.0 in | Wt 263.0 lb

## 2024-09-07 DIAGNOSIS — Z7985 Long-term (current) use of injectable non-insulin antidiabetic drugs: Secondary | ICD-10-CM | POA: Diagnosis not present

## 2024-09-07 DIAGNOSIS — E1169 Type 2 diabetes mellitus with other specified complication: Secondary | ICD-10-CM

## 2024-09-07 DIAGNOSIS — Z6841 Body Mass Index (BMI) 40.0 and over, adult: Secondary | ICD-10-CM | POA: Diagnosis not present

## 2024-09-07 DIAGNOSIS — E119 Type 2 diabetes mellitus without complications: Secondary | ICD-10-CM | POA: Diagnosis not present

## 2024-09-07 DIAGNOSIS — E669 Obesity, unspecified: Secondary | ICD-10-CM

## 2024-09-07 DIAGNOSIS — E559 Vitamin D deficiency, unspecified: Secondary | ICD-10-CM

## 2024-09-07 MED ORDER — TIRZEPATIDE 7.5 MG/0.5ML ~~LOC~~ SOAJ
7.5000 mg | SUBCUTANEOUS | 0 refills | Status: DC
  Filled 2024-09-07: qty 6, 84d supply, fill #0

## 2024-09-07 MED ORDER — VITAMIN D (ERGOCALCIFEROL) 1.25 MG (50000 UNIT) PO CAPS
50000.0000 [IU] | ORAL_CAPSULE | ORAL | 0 refills | Status: DC
  Filled 2024-09-07: qty 12, 84d supply, fill #0

## 2024-09-07 NOTE — Progress Notes (Signed)
 Office: [phone]  /  Fax: [phone]  WEIGHT SUMMARY AND BIOMETRICS  Anthropometric Measurements Height: 5' 2 (1.575 m) Weight: 263 lb (119.3 kg) BMI (Calculated): 48.09 Weight at Last Visit: 266 lb Weight Lost Since Last Visit: 0 Weight Gained Since Last Visit: 0 Starting Weight: 282 lb Total Weight Loss (lbs): 19 lb (8.618 kg) Peak Weight: 287 lb   Body Composition  Body Fat %: 56 % Fat Mass (lbs): 147.6 lbs Muscle Mass (lbs): 110.2 lbs Visceral Fat Rating : 20   Other Clinical Data Fasting: no Labs: no Today's Visit #: 16 Starting Date: 06/25/23    Chief Complaint: OBESITY     History of Present Illness [name] is a 53 year old female with obesity and type two diabetes who presents for obesity treatment plan assessment and progress evaluation.  She is currently on Mounjaro  for type two diabetes management and weight loss, maintaining a hemoglobin A1c of 6.8 since 2023.  She adheres to a category two eating plan about 80% of the time but struggles with protein intake. She has increased her consumption of fruits and vegetables and is working on improving hydration. She does not skip meals and generally sleeps seven or more hours most nights.  She engages in physical activity, including walking and treadmill exercises, most days of the week for about 30 minutes each session. Despite these efforts, her weight has remained stable over the past month since her last visit.  She has a history of vitamin D  deficiency and is being treated with Drisdol , 50,000 IU weekly, and requests a refill.  She will be traveling over the next 6 weeks to spend time with her family and requests 90 day refills of Mounjaro  and vit D      PHYSICAL EXAM:  Blood pressure 135/82, pulse 87, temperature 97.9 F (36.6 C), height 5' 2 (1.575 m), weight 263 lb (119.3 kg), SpO2 100%. Body mass index is 48.1 kg/m.  DIAGNOSTIC DATA REVIEWED:  BMET    Component Value  Date/Time   NA 138 08/18/2024 0813   NA 137 05/20/2024 0741   K 4.6 08/18/2024 0813   CL 102 08/18/2024 0813   CO2 30 08/18/2024 0813   GLUCOSE 83 08/18/2024 0813   BUN 15 08/18/2024 0813   BUN 14 05/20/2024 0741   CREATININE 0.71 08/18/2024 0813   CREATININE 0.61 10/19/2015 1052   CALCIUM 9.1 08/18/2024 0813   GFRNONAA 90 02/29/2020 0940   GFRNONAA >89 10/19/2015 1052   GFRAA 103 02/29/2020 0940   GFRAA >89 10/19/2015 1052   Lab Results  Component Value Date   HGBA1C 5.3 05/20/2024   HGBA1C 5.6 06/23/2014   Lab Results  Component Value Date   INSULIN  12.9 05/20/2024   INSULIN  19.5 06/25/2023   Lab Results  Component Value Date   TSH 3.62 06/12/2023   CBC    Component Value Date/Time   WBC 7.0 06/12/2023 1609   RBC 5.28 (H) 06/12/2023 1609   HGB 12.9 06/12/2023 1609   HGB 12.9 01/21/2019 1050   HCT 39.6 06/12/2023 1609   HCT 38.8 01/21/2019 1050   PLT 289.0 06/12/2023 1609   PLT 272 01/21/2019 1050   MCV 75.0 (L) 06/12/2023 1609   MCV 75 (L) 01/21/2019 1050   MCH 25.0 (L) 01/21/2019 1050   MCH 23.0 (L) 02/13/2016 1845   MCHC 32.6 06/12/2023 1609   RDW 14.9 06/12/2023 1609   RDW 17.2 (H) 01/21/2019 1050   Iron Studies    Component  Value Date/Time   IRON 73 11/08/2017 1642   TIBC 363 01/31/2017 1725   FERRITIN 7 (L) 02/13/2016 1845   IRONPCTSAT 12 (L) 01/31/2017 1725   Lipid Panel     Component Value Date/Time   CHOL 126 08/18/2024 0813   CHOL 141 05/20/2024 0741   TRIG 52.0 08/18/2024 0813   HDL 41.20 08/18/2024 0813   HDL 45 05/20/2024 0741   CHOLHDL 3 08/18/2024 0813   VLDL 10.4 08/18/2024 0813   LDLCALC 75 08/18/2024 0813   LDLCALC 81 05/20/2024 0741   Hepatic Function Panel     Component Value Date/Time   PROT 6.8 08/18/2024 0813   PROT 6.9 05/20/2024 0741   ALBUMIN 4.2 08/18/2024 0813   ALBUMIN 4.4 05/20/2024 0741   AST 15 08/18/2024 0813   ALT 16 08/18/2024 0813   ALKPHOS 61 08/18/2024 0813   BILITOT 0.3 08/18/2024 0813   BILITOT  0.3 05/20/2024 0741   BILIDIR <0.10 06/25/2023 0906      Component Value Date/Time   TSH 3.62 06/12/2023 1609   Nutritional Lab Results  Component Value Date   VD25OH 51.0 05/20/2024   VD25OH 51.4 10/28/2023   VD25OH 34.1 06/25/2023     Assessment and Plan Assessment & Plan Obesity Obesity management with a focus on weight loss. Currently on Mounjaro  for weight loss and type 2 diabetes management. Following a category two eating plan 80% of the time. Struggling with protein intake, which may affect metabolism. Engaging in regular physical activity, including walking and treadmill exercises for about 30 minutes most days. Weight has been maintained over the last month. - Continue Mounjaro  for weight loss and diabetes management - Continue category 2 eating plan - Emphasize the importance of adequate protein intake to support metabolism, fish is an especially good choice. - Continue regular physical activity, aiming for at least 30 minutes most days  Type 2 diabetes mellitus Type 2 diabetes with a hemoglobin A1c of 6.8, indicating good control since 2023. Managed with Mounjaro , which also aids in weight loss. - Continue Mounjaro  for diabetes management - RF x 90 days - Continue diet, exercise and weight loss as discussed today as an important part of the treatment plan   Vitamin D  deficiency Vitamin D  deficiency currently managed with Drisdol , 50,000 IU weekly. - Refill prescription for Drisdol , 50,000 IU weekly x 90 days       [name] was informed of the importance of frequent follow up visits to maximize her success with intensive lifestyle modifications for her obesity and obesity related health conditions as recommended by USPSTF and CMS guidelines   Louann Penton, MD

## 2024-09-09 ENCOUNTER — Other Ambulatory Visit (HOSPITAL_COMMUNITY): Payer: Self-pay

## 2024-09-09 MED ORDER — ATOVAQUONE-PROGUANIL HCL 250-100 MG PO TABS
1.0000 | ORAL_TABLET | Freq: Every day | ORAL | 0 refills | Status: AC
  Filled 2024-09-09: qty 37, 37d supply, fill #0

## 2024-09-09 MED ORDER — AZITHROMYCIN 500 MG PO TABS
ORAL_TABLET | ORAL | 0 refills | Status: AC
  Filled 2024-09-09: qty 4, 3d supply, fill #0

## 2024-09-10 ENCOUNTER — Other Ambulatory Visit (HOSPITAL_COMMUNITY): Payer: Self-pay

## 2024-09-11 ENCOUNTER — Other Ambulatory Visit (HOSPITAL_COMMUNITY): Payer: Self-pay

## 2024-09-11 MED ORDER — COVID-19 MRNA VAC-TRIS(PFIZER) 30 MCG/0.3ML IM SUSY
PREFILLED_SYRINGE | INTRAMUSCULAR | 0 refills | Status: AC
  Filled 2024-09-11: qty 0.3, 1d supply, fill #0

## 2024-09-14 ENCOUNTER — Other Ambulatory Visit (HOSPITAL_COMMUNITY): Payer: Self-pay

## 2024-09-14 ENCOUNTER — Telehealth: Payer: Self-pay

## 2024-09-14 NOTE — Telephone Encounter (Signed)
 Patient is aware that she can pick up medications tomorrow.

## 2024-09-14 NOTE — Telephone Encounter (Signed)
 Patient will be able to get medication tomorrow. She does not need a PA. She requested refill to soon

## 2024-09-14 NOTE — Telephone Encounter (Signed)
 Copied from CRM [id number]. Topic: Clinical - Medication Prior Auth >> Sep 14, 2024  8:56 AM Aleatha C wrote: Reason for CRM: Patient needs PA for metoprolol  succinate (TOPROL -XL) 25 MG 24 hr tablet and amLODipine  (NORVASC ) 10 MG tablet

## 2024-09-29 ENCOUNTER — Other Ambulatory Visit (HOSPITAL_COMMUNITY): Payer: Self-pay

## 2024-10-12 ENCOUNTER — Ambulatory Visit (INDEPENDENT_AMBULATORY_CARE_PROVIDER_SITE_OTHER): Admitting: Family Medicine

## 2024-10-16 ENCOUNTER — Other Ambulatory Visit: Payer: Self-pay | Admitting: Family Medicine

## 2024-10-16 DIAGNOSIS — I10 Essential (primary) hypertension: Secondary | ICD-10-CM

## 2024-10-19 ENCOUNTER — Other Ambulatory Visit (HOSPITAL_COMMUNITY): Payer: Self-pay

## 2024-10-20 ENCOUNTER — Ambulatory Visit (INDEPENDENT_AMBULATORY_CARE_PROVIDER_SITE_OTHER): Admitting: Family Medicine

## 2024-10-20 ENCOUNTER — Ambulatory Visit (INDEPENDENT_AMBULATORY_CARE_PROVIDER_SITE_OTHER): Payer: Self-pay | Admitting: Family Medicine

## 2024-10-20 ENCOUNTER — Other Ambulatory Visit (HOSPITAL_COMMUNITY): Payer: Self-pay

## 2024-10-20 ENCOUNTER — Encounter (INDEPENDENT_AMBULATORY_CARE_PROVIDER_SITE_OTHER): Payer: Self-pay | Admitting: Family Medicine

## 2024-10-20 DIAGNOSIS — E1169 Type 2 diabetes mellitus with other specified complication: Secondary | ICD-10-CM

## 2024-10-20 DIAGNOSIS — E559 Vitamin D deficiency, unspecified: Secondary | ICD-10-CM | POA: Diagnosis not present

## 2024-10-20 DIAGNOSIS — Z6841 Body Mass Index (BMI) 40.0 and over, adult: Secondary | ICD-10-CM | POA: Diagnosis not present

## 2024-10-20 DIAGNOSIS — E669 Obesity, unspecified: Secondary | ICD-10-CM | POA: Diagnosis not present

## 2024-10-20 DIAGNOSIS — Z7985 Long-term (current) use of injectable non-insulin antidiabetic drugs: Secondary | ICD-10-CM | POA: Diagnosis not present

## 2024-10-20 MED ORDER — TIRZEPATIDE 7.5 MG/0.5ML ~~LOC~~ SOAJ
7.5000 mg | SUBCUTANEOUS | 0 refills | Status: DC
  Filled 2024-10-20 – 2024-11-05 (×2): qty 2, 28d supply, fill #0

## 2024-10-20 MED ORDER — LOSARTAN POTASSIUM 25 MG PO TABS
25.0000 mg | ORAL_TABLET | Freq: Every day | ORAL | 1 refills | Status: AC
  Filled 2024-10-20: qty 90, 45d supply, fill #0
  Filled 2024-12-01: qty 90, 45d supply, fill #1

## 2024-10-20 NOTE — Progress Notes (Signed)
 [name], D.O.  ABFM, ABOM Specializing in Clinical Bariatric Medicine  Office located at: [address] [place], [place]  [address]      A) FOR THE CHRONIC DISEASE OF OBESITY:  Chief complaint: Obesity [name] is here to discuss her progress with her obesity treatment plan.   History of present illness / Interval history:  [name] is here today for her follow-up office visit.  Since last OV on 09/07/24, pt is up 5 lbs. Patient states that she went home for Thanksgiving and was gone for about a months. She came back Friday and still feels jet lagged.     09/07/24 15:00 10/20/24 15:00   Body Fat % 56 % 54.1 %  Muscle Mass (lbs) 110.2 lbs 117.2 lbs  Fat Mass (lbs) 147.6 lbs 145.4 lbs  Visceral Fat Rating  20 20    Counseling done on how various foods will affect these numbers and how to maximize success.   Total lbs lost to date: - 14 lbs Total Fat Mass in lbs lost to date: - 8.2 lbs Total weight loss percentage to date: - 4.96 %    Obesity, starting BMI 51.58 BMI 45.0-49.9, adult [facility])  Nutrition Therapy She is on the Category 2 Plan and states she is following her eating plan approximately 0 % of the time.   - Tracking Calories/Macros: no  - Eating More Whole Foods: yes  - Adequate Protein Intake: yes  - Adequate Water Intake: yes  - Skipping Meals: no   - Sleeping 7-9 Hours/ Night: no    [name] is currently in the action stage of change. As such, her goal is to continue weight management plan.  She has agreed to: continue current plan   Physical Activity [name] is not exercising.   [name] has been advised to work up to 300-450 minutes of moderate intensity aerobic activity a week and strengthening exercises 2-3 times per week for cardiovascular health, weight loss maintenance and preservation of muscle mass.  She has agreed to : Think about enjoyable ways to increase daily physical activity and overcoming barriers to exercise and Increase  volume of physical activity to a goal of 240 minutes a week   Behavioral Modifications Evidence-based interventions for health behavior change were utilized today including the discussion of   1) problem-solving barriers:    - Increase protein intake  2) SMART goals for next OV:    - Walk/exercise 40 min 4 days a week   Regarding patient's less desirable eating habits and patterns, we employed the technique of small changes.   We discussed the following today: increasing lean protein intake to established goals, avoiding skipping meals, increasing water intake , and continue to work on implementation of reduced calorie nutritional plan Additional resources provided today: None   Medical Interventions/ Pharmacotherapy Previous Bariatric surgery: n/a Pharmacotherapy for weight loss: She is currently taking Mounjaro  7.5 mg weekly  for medical weight loss.    We discussed various medication options to help [name] with her weight loss efforts and we both agreed to : Adequate clinical response to anti-obesity medication, continue current regimen   B) OBESITY RELATED CONDITIONS ADDRESSED TODAY:  Type 2 diabetes mellitus with other specified complication, without long-term current use of insulin  [facility]) Assessment & Plan Lab Results  Component Value Date   HGBA1C 5.3 05/20/2024   HGBA1C 5.7 (H) 10/28/2023   HGBA1C 6.0 06/12/2023   INSULIN  12.9 05/20/2024   INSULIN  19.5 06/25/2023  On Mounjaro  7.5 mg once weekly. Patient has been on this dose since August 5th. Discussed with patient the side effects/risks and benefits of increasing the Mounjaro . Patient denies wanting to go up on dose today but will be aware of her hunger cravings and will consider increasing dose at next OV. She denies checking her blood sugars in the last months. Denies any hypo/hyperglycemia symptoms. Recommended that patient check her her blood sugars and continue to follow prudent meal plan, decreasing simple carbs and  sugars.     Vitamin D  deficiency Assessment & Plan Lab Results  Component Value Date   VD25OH 51.0 05/20/2024   VD25OH 51.4 10/28/2023   VD25OH 34.1 06/25/2023   On ERGO 50K units once weekly. Good compliance and tolerance. No acute concerns today. Will obtain labs at next OV. Continue supplementation.     Medications Discontinued During This Encounter  Medication Reason   tirzepatide  (MOUNJARO ) 7.5 MG/0.5ML Pen Reorder     Meds ordered this encounter  Medications   tirzepatide  (MOUNJARO ) 7.5 MG/0.5ML Pen    Sig: Inject 7.5 mg into the skin once a week.    Dispense:  2 mL    Refill:  0    Follow up:   Return 11/23/2024 at 4:00 PM  She was informed of the importance of frequent follow up visits to maximize her success with intensive lifestyle modifications for her multiple health conditions.   Weight Summary and Biometrics   Weight Lost Since Last Visit: 0  Weight Gained Since Last Visit: 5 lb   Vitals Temp: 98.8 F (37.1 C) BP: (!) 143/81 Pulse Rate: 85 SpO2: 99 %   Anthropometric Measurements Height: 5' 2 (1.575 m) Weight: 268 lb (121.6 kg) BMI (Calculated): 49.01 Weight at Last Visit: 263 lb Weight Lost Since Last Visit: 0 Weight Gained Since Last Visit: 5 lb Starting Weight: 282 lb Total Weight Loss (lbs): 14 lb (6.35 kg) Peak Weight: 287 lb   Body Composition  Body Fat %: 54.1 % Fat Mass (lbs): 145.4 lbs Muscle Mass (lbs): 117.2 lbs Visceral Fat Rating : 20   Other Clinical Data Fasting: No Labs: No Today's Visit #: 17 Starting Date: 06/25/23    Objective:   PHYSICAL EXAM: Blood pressure (!) 143/81, pulse 85, temperature 98.8 F (37.1 C), height 5' 2 (1.575 m), weight 268 lb (121.6 kg), SpO2 99%. Body mass index is 49.02 kg/m.  General: she is overweight, cooperative and in no acute distress. PSYCH: Has normal mood, affect and thought process.   HEENT: EOMI, sclerae are anicteric. Lungs: Normal breathing effort, no  conversational dyspnea. Extremities: Moves * 4 Neurologic: A and O * 3, good insight  DIAGNOSTIC DATA REVIEWED: BMET    Component Value Date/Time   NA 138 08/18/2024 0813   NA 137 05/20/2024 0741   K 4.6 08/18/2024 0813   CL 102 08/18/2024 0813   CO2 30 08/18/2024 0813   GLUCOSE 83 08/18/2024 0813   BUN 15 08/18/2024 0813   BUN 14 05/20/2024 0741   CREATININE 0.71 08/18/2024 0813   CREATININE 0.61 10/19/2015 1052   CALCIUM 9.1 08/18/2024 0813   GFRNONAA 90 02/29/2020 0940   GFRNONAA >89 10/19/2015 1052   GFRAA 103 02/29/2020 0940   GFRAA >89 10/19/2015 1052   Lab Results  Component Value Date   HGBA1C 5.3 05/20/2024   HGBA1C 5.6 06/23/2014   Lab Results  Component Value Date   INSULIN  12.9 05/20/2024   INSULIN  19.5 06/25/2023   Lab Results  Component  Value Date   TSH 3.62 06/12/2023   CBC    Component Value Date/Time   WBC 7.0 06/12/2023 1609   RBC 5.28 (H) 06/12/2023 1609   HGB 12.9 06/12/2023 1609   HGB 12.9 01/21/2019 1050   HCT 39.6 06/12/2023 1609   HCT 38.8 01/21/2019 1050   PLT 289.0 06/12/2023 1609   PLT 272 01/21/2019 1050   MCV 75.0 (L) 06/12/2023 1609   MCV 75 (L) 01/21/2019 1050   MCH 25.0 (L) 01/21/2019 1050   MCH 23.0 (L) 02/13/2016 1845   MCHC 32.6 06/12/2023 1609   RDW 14.9 06/12/2023 1609   RDW 17.2 (H) 01/21/2019 1050   Iron Studies    Component Value Date/Time   IRON 73 11/08/2017 1642   TIBC 363 01/31/2017 1725   FERRITIN 7 (L) 02/13/2016 1845   IRONPCTSAT 12 (L) 01/31/2017 1725   Lipid Panel     Component Value Date/Time   CHOL 126 08/18/2024 0813   CHOL 141 05/20/2024 0741   TRIG 52.0 08/18/2024 0813   HDL 41.20 08/18/2024 0813   HDL 45 05/20/2024 0741   CHOLHDL 3 08/18/2024 0813   VLDL 10.4 08/18/2024 0813   LDLCALC 75 08/18/2024 0813   LDLCALC 81 05/20/2024 0741   Hepatic Function Panel     Component Value Date/Time   PROT 6.8 08/18/2024 0813   PROT 6.9 05/20/2024 0741   ALBUMIN 4.2 08/18/2024 0813   ALBUMIN  4.4 05/20/2024 0741   AST 15 08/18/2024 0813   ALT 16 08/18/2024 0813   ALKPHOS 61 08/18/2024 0813   BILITOT 0.3 08/18/2024 0813   BILITOT 0.3 05/20/2024 0741   BILIDIR <0.10 06/25/2023 0906      Component Value Date/Time   TSH 3.62 06/12/2023 1609   Nutritional Lab Results  Component Value Date   VD25OH 51.0 05/20/2024   VD25OH 51.4 10/28/2023   VD25OH 34.1 06/25/2023    Attestations:   I, Sonny Laroche, acting as a stage manager for Barnie Jenkins, DO., have compiled all relevant documentation for today's office visit on behalf of Barnie Jenkins, DO, while in the presence of [employer], DO.   I have reviewed the above documentation for accuracy and completeness, and I agree with the above. Barnie JINNY Jenkins, D.O.  The 21st Century Cures Act was signed into law in 2016 which includes the topic of electronic health records.  This provides immediate access to information in MyChart.  This includes consultation notes, operative notes, office notes, lab results and pathology reports.  If you have any questions about what you read please let us  know at your next visit so we can discuss your concerns and take corrective action if need be.  We are right here with you.

## 2024-10-21 ENCOUNTER — Other Ambulatory Visit (HOSPITAL_COMMUNITY): Payer: Self-pay

## 2024-11-03 ENCOUNTER — Ambulatory Visit (INDEPENDENT_AMBULATORY_CARE_PROVIDER_SITE_OTHER): Admitting: Family Medicine

## 2024-11-05 ENCOUNTER — Other Ambulatory Visit (HOSPITAL_COMMUNITY): Payer: Self-pay

## 2024-11-10 ENCOUNTER — Ambulatory Visit (INDEPENDENT_AMBULATORY_CARE_PROVIDER_SITE_OTHER): Admitting: Family Medicine

## 2024-11-23 ENCOUNTER — Ambulatory Visit (INDEPENDENT_AMBULATORY_CARE_PROVIDER_SITE_OTHER): Payer: Self-pay | Admitting: Family Medicine

## 2024-11-23 ENCOUNTER — Encounter (INDEPENDENT_AMBULATORY_CARE_PROVIDER_SITE_OTHER): Payer: Self-pay | Admitting: Family Medicine

## 2024-11-23 ENCOUNTER — Other Ambulatory Visit (HOSPITAL_COMMUNITY): Payer: Self-pay

## 2024-11-23 DIAGNOSIS — E669 Obesity, unspecified: Secondary | ICD-10-CM

## 2024-11-23 DIAGNOSIS — E559 Vitamin D deficiency, unspecified: Secondary | ICD-10-CM

## 2024-11-23 DIAGNOSIS — I10 Essential (primary) hypertension: Secondary | ICD-10-CM | POA: Diagnosis not present

## 2024-11-23 DIAGNOSIS — Z7985 Long-term (current) use of injectable non-insulin antidiabetic drugs: Secondary | ICD-10-CM

## 2024-11-23 DIAGNOSIS — Z6841 Body Mass Index (BMI) 40.0 and over, adult: Secondary | ICD-10-CM | POA: Diagnosis not present

## 2024-11-23 DIAGNOSIS — E1169 Type 2 diabetes mellitus with other specified complication: Secondary | ICD-10-CM | POA: Diagnosis not present

## 2024-11-23 DIAGNOSIS — I152 Hypertension secondary to endocrine disorders: Secondary | ICD-10-CM

## 2024-11-23 MED ORDER — TIRZEPATIDE 10 MG/0.5ML ~~LOC~~ SOAJ
10.0000 mg | SUBCUTANEOUS | 0 refills | Status: DC
  Filled 2024-11-23: qty 2, 28d supply, fill #0

## 2024-11-23 MED ORDER — VITAMIN D (ERGOCALCIFEROL) 1.25 MG (50000 UNIT) PO CAPS
50000.0000 [IU] | ORAL_CAPSULE | ORAL | 0 refills | Status: AC
  Filled 2024-11-23: qty 12, 84d supply, fill #0

## 2024-11-23 MED ORDER — CARVEDILOL PHOSPHATE ER 20 MG PO CP24
20.0000 mg | ORAL_CAPSULE | Freq: Every day | ORAL | 0 refills | Status: DC
  Filled 2024-11-23 – 2024-11-25 (×2): qty 30, 30d supply, fill #0

## 2024-11-23 NOTE — Progress Notes (Signed)
 "  [name], D.O.  ABFM, ABOM Specializing in Clinical Bariatric Medicine  Office located at: [address] [place], [place]  [address]      A) FOR THE CHRONIC DISEASE OF OBESITY:  Obesity, starting BMI 51.58 BMI 50.0-59.9, adult (HCC) - Current BMI 50.1   Chief complaint: Obesity [name] is here to discuss her progress with her obesity treatment plan.   History of present illness / Interval history:  [name] is here today for her follow-up office visit.  Since last OV on 10/20/2024, pt is up 6 lbs.    10/20/24 15:00 11/23/24 15:00   Body Fat % 54.1 % 56 %  Muscle Mass (lbs) 117.2 lbs 114.6 lbs  Fat Mass (lbs) 145.4 lbs 153.8 lbs  Visceral Fat Rating  20 21  Counseling done on how various foods will affect these numbers and how to maximize success   Total lbs lost to date: -8 lbs Total Fat Mass in lbs lost to date:  Total weight loss percentage to date: -2.84 %   Nutrition Therapy She is on the Category 2 Plan and states she is following her eating plan approximately 80 % of the time.   - Tracking Calories/Macros: no - ***  - Eating More Whole Foods: yes  - Adequate Protein Intake: yes  - Adequate Water Intake: yes  - Skipping Meals: no - ***  - Sleeping 7-9 Hours/ Night: yes   [name] is currently in the action stage of change. As such, her goal is to continue weight management plan.  She has agreed to: continue current plan   Physical Activity Pt is walking 20  minutes 3 days per week   [name] has been advised to work up to 300-450 minutes of moderate intensity aerobic activity a week and strengthening exercises 2-3 times per week for cardiovascular health, weight loss maintenance and preservation of muscle mass.  She has agreed to : Think about enjoyable ways to increase daily physical activity and overcoming barriers to exercise, Increase physical activity in their day and reduce sedentary time (increase NEAT)., and Combine aerobic and  strengthening exercises for efficiency and improved cardiometabolic health.   Behavioral Modifications Evidence-based interventions for health behavior change were utilized today including the discussion of  1) self monitoring techniques:  monitor BP at home  2) problem-solving barriers:  Toprol -XL change 3) self care:  exercise Regarding patient's less desirable eating habits and patterns, we employed the technique of small changes.   We discussed the following today: increasing lean protein intake to established goals, decreasing simple carbohydrates , continue to work on implementation of reduced calorie nutritional plan, and continue to practice mindfulness when eating Additional resources provided today: None   Medical Interventions/ Pharmacotherapy Previous Bariatric surgery: none Pharmacotherapy for weight loss: She is currently taking Mounjaro  7.5 mg once weekly for medical weight loss.    We discussed various medication options to help [name] with her weight loss efforts and we both agreed to : Continue with current nutritional and behavioral strategies   B) OBESITY RELATED CONDITIONS ADDRESSED TODAY:   Type 2 diabetes mellitus with other specified complication, without long-term current use of insulin  [facility]) Assessment & Plan Lab Results  Component Value Date   HGBA1C 5.3 05/20/2024   HGBA1C 5.7 (H) 10/28/2023   HGBA1C 6.0 06/12/2023   INSULIN  12.9 05/20/2024   INSULIN  19.5 06/25/2023  Currently on Mounjaro  7.5 mg once weekly, and has been on this dose since August 5th. Pt  reports increased hunger and cravings. Increase Mounjaro  dose to 10 mg once weekly. Discussed the risks and benefits of increased dose. To minimize side effects, educated pt that she must adhere to MP and eat protein and whole foods. Cont decreasing simple carbs/sugars and increasing lean protein. Increase exercise as able.     Vitamin D  deficiency Lab Results  Component Value Date   VD25OH 51.0  05/20/2024   VD25OH 51.4 10/28/2023   VD25OH 34.1 06/25/2023  On Ergo 50K units once weekly with reported good compliance and tolerance. Vit D levels are at goal today.  No acute concerns today. Cont regimen(refill today). Will recheck as necessary.    Essential hypertension Assessment & Plan BP Readings from Last 3 Encounters:  11/23/24 132/84  10/20/24 (!) 143/81  09/07/24 135/82   The ASCVD Risk score (Arnett DK, et al., 2019) failed to calculate for the following reasons:   The valid total cholesterol range is 130 to 320 mg/dL  Lab Results  Component Value Date   CREATININE 0.71 08/18/2024  BP is well controlled today at 132/84. HTN managed by Toprol -XL 25 mg once daily, Cozaar  25 mg once daily, and Norvasc  10 mg once daily. Metoprolol  is an obesogenic drug. Change Metoprolol  succinate 25 mg once daily to Coreg  20 mg once daily to alleviate the obesogenic effects of Metoprolol .  Cont low-sodium, heart-healthy diet. Encouraged to monitor BP at home. [name] with PCP as needed.     Medications Discontinued During This Encounter  Medication Reason   metoprolol  succinate (TOPROL -XL) 25 MG 24 hr tablet Side effect (s)   tirzepatide  (MOUNJARO ) 7.5 MG/0.5ML Pen      Meds ordered this encounter  Medications   Vitamin D , Ergocalciferol , (DRISDOL ) 1.25 MG (50000 UNIT) CAPS capsule    Sig: Take 1 capsule (50,000 Units total) by mouth every 7 (seven) days.    Dispense:  12 capsule    Refill:  0   carvedilol  (COREG  CR) 20 MG 24 hr capsule    Sig: Take 1 capsule (20 mg total) by mouth daily.    Dispense:  30 capsule    Refill:  0   tirzepatide  (MOUNJARO ) 10 MG/0.5ML Pen    Sig: Inject 10 mg into the skin once a week.    Dispense:  2 mL    Refill:  0      Follow up:   Return 12/22/2024 4:00 PM.  She was informed of the importance of frequent follow up visits to maximize her success with intensive lifestyle modifications for her multiple health conditions.   Weight Summary and  Biometrics   Weight Lost Since Last Visit: 0lb  Weight Gained Since Last Visit: 6lb    Vitals Temp: 98.5 F (36.9 C) BP: 132/84 Pulse Rate: 80 SpO2: 100 %   Anthropometric Measurements Height: 5' 2 (1.575 m) Weight: 274 lb (124.3 kg) BMI (Calculated): 50.1 Weight at Last Visit: 268lb Weight Lost Since Last Visit: 0lb Weight Gained Since Last Visit: 6lb Starting Weight: 282lb Total Weight Loss (lbs): 8 lb (3.629 kg) Peak Weight: 287lb   Body Composition  Body Fat %: 56 % Fat Mass (lbs): 153.8 lbs Muscle Mass (lbs): 114.6 lbs Visceral Fat Rating : 21   Other Clinical Data Fasting: no Labs: no Today's Visit #: 18 Starting Date: 06/25/23    Objective:   PHYSICAL EXAM: Blood pressure 132/84, pulse 80, temperature 98.5 F (36.9 C), height 5' 2 (1.575 m), weight 274 lb (124.3 kg), SpO2 100%. Body mass index is 50.12  kg/m.  General: she is overweight, cooperative and in no acute distress. PSYCH: Has normal mood, affect and thought process.   HEENT: EOMI, sclerae are anicteric. Lungs: Normal breathing effort, no conversational dyspnea. Extremities: Moves * 4 Neurologic: A and O * 3, good insight  DIAGNOSTIC DATA REVIEWED: BMET    Component Value Date/Time   NA 138 08/18/2024 0813   NA 137 05/20/2024 0741   K 4.6 08/18/2024 0813   CL 102 08/18/2024 0813   CO2 30 08/18/2024 0813   GLUCOSE 83 08/18/2024 0813   BUN 15 08/18/2024 0813   BUN 14 05/20/2024 0741   CREATININE 0.71 08/18/2024 0813   CREATININE 0.61 10/19/2015 1052   CALCIUM 9.1 08/18/2024 0813   GFRNONAA 90 02/29/2020 0940   GFRNONAA >89 10/19/2015 1052   GFRAA 103 02/29/2020 0940   GFRAA >89 10/19/2015 1052   Lab Results  Component Value Date   HGBA1C 5.3 05/20/2024   HGBA1C 5.6 06/23/2014   Lab Results  Component Value Date   INSULIN  12.9 05/20/2024   INSULIN  19.5 06/25/2023   Lab Results  Component Value Date   TSH 3.62 06/12/2023   CBC    Component Value Date/Time   WBC  7.0 06/12/2023 1609   RBC 5.28 (H) 06/12/2023 1609   HGB 12.9 06/12/2023 1609   HGB 12.9 01/21/2019 1050   HCT 39.6 06/12/2023 1609   HCT 38.8 01/21/2019 1050   PLT 289.0 06/12/2023 1609   PLT 272 01/21/2019 1050   MCV 75.0 (L) 06/12/2023 1609   MCV 75 (L) 01/21/2019 1050   MCH 25.0 (L) 01/21/2019 1050   MCH 23.0 (L) 02/13/2016 1845   MCHC 32.6 06/12/2023 1609   RDW 14.9 06/12/2023 1609   RDW 17.2 (H) 01/21/2019 1050   Iron Studies    Component Value Date/Time   IRON 73 11/08/2017 1642   TIBC 363 01/31/2017 1725   FERRITIN 7 (L) 02/13/2016 1845   IRONPCTSAT 12 (L) 01/31/2017 1725   Lipid Panel     Component Value Date/Time   CHOL 126 08/18/2024 0813   CHOL 141 05/20/2024 0741   TRIG 52.0 08/18/2024 0813   HDL 41.20 08/18/2024 0813   HDL 45 05/20/2024 0741   CHOLHDL 3 08/18/2024 0813   VLDL 10.4 08/18/2024 0813   LDLCALC 75 08/18/2024 0813   LDLCALC 81 05/20/2024 0741   Hepatic Function Panel     Component Value Date/Time   PROT 6.8 08/18/2024 0813   PROT 6.9 05/20/2024 0741   ALBUMIN 4.2 08/18/2024 0813   ALBUMIN 4.4 05/20/2024 0741   AST 15 08/18/2024 0813   ALT 16 08/18/2024 0813   ALKPHOS 61 08/18/2024 0813   BILITOT 0.3 08/18/2024 0813   BILITOT 0.3 05/20/2024 0741   BILIDIR <0.10 06/25/2023 0906      Component Value Date/Time   TSH 3.62 06/12/2023 1609   Nutritional Lab Results  Component Value Date   VD25OH 51.0 05/20/2024   VD25OH 51.4 10/28/2023   VD25OH 34.1 06/25/2023    Attestations:   I, Feliciano Mingle, acting as a stage manager for [employer], DO., have compiled all relevant documentation for today's office visit on behalf of Deborah Jenkins, DO, while in the presence of [employer], DO.   I have reviewed the above documentation for accuracy and completeness, and I agree with the above. Deborah JINNY Marks, D.O.  The 21st Century Cures Act was signed into law in 2016 which includes the topic of electronic health records.  This  provides  immediate access to information in MyChart.  This includes consultation notes, operative notes, office notes, lab results and pathology reports.  If you have any questions about what you read please let us  know at your next visit so we can discuss your concerns and take corrective action if need be.  We are right here with you.  "

## 2024-11-24 ENCOUNTER — Telehealth (HOSPITAL_COMMUNITY): Payer: Self-pay

## 2024-11-24 ENCOUNTER — Other Ambulatory Visit (HOSPITAL_COMMUNITY): Payer: Self-pay

## 2024-11-24 NOTE — Telephone Encounter (Signed)
 Pharmacy Patient Advocate Encounter   Received notification from Pt Calls Messages that prior authorization for Carvedilol  20 mg 24 hr capsule is required/requested.   Insurance verification completed.   The patient is insured through [facility].   Per test claim:  Carvedilol  tablets, Metoprolol  Succ ER tablets, Bisoprolol tablets is preferred by the insurance.  If suggested medication is appropriate, Please send in a new RX and discontinue this one. If not, please advise as to why it's not appropriate so that we may request a Prior Authorization. Please note, some preferred medications may still require a PA.  If the suggested medications have not been trialed and there are no contraindications to their use, the PA will not be submitted, as it will not be approved.

## 2024-11-24 NOTE — Telephone Encounter (Signed)
 PA request has been Received. New Encounter has been or will be created for follow up. For additional info see Pharmacy Prior Auth telephone encounter from 11/24/24.

## 2024-11-25 ENCOUNTER — Other Ambulatory Visit (HOSPITAL_COMMUNITY): Payer: Self-pay

## 2024-11-25 ENCOUNTER — Other Ambulatory Visit (INDEPENDENT_AMBULATORY_CARE_PROVIDER_SITE_OTHER): Payer: Self-pay | Admitting: Family Medicine

## 2024-11-25 DIAGNOSIS — I152 Hypertension secondary to endocrine disorders: Secondary | ICD-10-CM

## 2024-11-26 ENCOUNTER — Other Ambulatory Visit (INDEPENDENT_AMBULATORY_CARE_PROVIDER_SITE_OTHER): Payer: Self-pay

## 2024-11-26 ENCOUNTER — Other Ambulatory Visit (HOSPITAL_COMMUNITY): Payer: Self-pay

## 2024-11-26 DIAGNOSIS — I152 Hypertension secondary to endocrine disorders: Secondary | ICD-10-CM

## 2024-11-26 MED ORDER — CARVEDILOL PHOSPHATE ER 20 MG PO CP24
20.0000 mg | ORAL_CAPSULE | Freq: Every day | ORAL | 0 refills | Status: DC
  Filled 2024-11-26 – 2024-12-14 (×2): qty 30, 30d supply, fill #0

## 2024-11-27 ENCOUNTER — Other Ambulatory Visit (HOSPITAL_COMMUNITY): Payer: Self-pay

## 2024-11-30 ENCOUNTER — Telehealth (INDEPENDENT_AMBULATORY_CARE_PROVIDER_SITE_OTHER): Payer: Self-pay

## 2024-11-30 NOTE — Telephone Encounter (Signed)
 PA for Coreg  20MG  has been submitted, awaiting PA questions.

## 2024-12-01 NOTE — Telephone Encounter (Signed)
 PA questions for Coreg  20MG   have been answered and all documentation has been included. Waiting on a determination.

## 2024-12-02 ENCOUNTER — Other Ambulatory Visit (HOSPITAL_COMMUNITY): Payer: Self-pay

## 2024-12-03 NOTE — Telephone Encounter (Signed)
 PA for Coreg  20MG  has been dismissed. PA is now complete.     The request was dismissed stating, the drug or product is specifically excluded under the members pharmacy benefit and is not reviewable for approval.

## 2024-12-08 ENCOUNTER — Other Ambulatory Visit (HOSPITAL_COMMUNITY): Payer: Self-pay

## 2024-12-14 ENCOUNTER — Other Ambulatory Visit (HOSPITAL_COMMUNITY): Payer: Self-pay

## 2024-12-14 ENCOUNTER — Other Ambulatory Visit (HOSPITAL_BASED_OUTPATIENT_CLINIC_OR_DEPARTMENT_OTHER): Payer: Self-pay

## 2024-12-14 ENCOUNTER — Telehealth (HOSPITAL_COMMUNITY): Payer: Self-pay

## 2024-12-22 ENCOUNTER — Encounter (INDEPENDENT_AMBULATORY_CARE_PROVIDER_SITE_OTHER): Payer: Self-pay | Admitting: Family Medicine

## 2024-12-22 ENCOUNTER — Other Ambulatory Visit (HOSPITAL_COMMUNITY): Payer: Self-pay

## 2024-12-22 ENCOUNTER — Ambulatory Visit (INDEPENDENT_AMBULATORY_CARE_PROVIDER_SITE_OTHER): Admitting: Family Medicine

## 2024-12-22 DIAGNOSIS — E1169 Type 2 diabetes mellitus with other specified complication: Secondary | ICD-10-CM

## 2024-12-22 DIAGNOSIS — E559 Vitamin D deficiency, unspecified: Secondary | ICD-10-CM

## 2024-12-22 DIAGNOSIS — I10 Essential (primary) hypertension: Secondary | ICD-10-CM

## 2024-12-22 DIAGNOSIS — Z6841 Body Mass Index (BMI) 40.0 and over, adult: Secondary | ICD-10-CM

## 2024-12-22 MED ORDER — TIRZEPATIDE 10 MG/0.5ML ~~LOC~~ SOAJ
10.0000 mg | SUBCUTANEOUS | 1 refills | Status: DC
  Filled 2024-12-22: qty 2, 28d supply, fill #0

## 2024-12-22 MED ORDER — METOPROLOL SUCCINATE ER 25 MG PO TB24
25.0000 mg | ORAL_TABLET | Freq: Every day | ORAL | 0 refills | Status: AC
  Filled 2024-12-22: qty 90, 90d supply, fill #0

## 2024-12-22 MED ORDER — METOPROLOL SUCCINATE ER 25 MG PO TB24
25.0000 mg | ORAL_TABLET | Freq: Every day | ORAL | 0 refills | Status: DC
  Filled 2024-12-22: qty 90, 90d supply, fill #0

## 2024-12-22 MED ORDER — TIRZEPATIDE 10 MG/0.5ML ~~LOC~~ SOAJ
10.0000 mg | SUBCUTANEOUS | 1 refills | Status: AC
  Filled 2024-12-22: qty 2, 28d supply, fill #0

## 2024-12-23 ENCOUNTER — Other Ambulatory Visit: Payer: Self-pay | Admitting: Family Medicine

## 2024-12-23 DIAGNOSIS — E559 Vitamin D deficiency, unspecified: Secondary | ICD-10-CM

## 2024-12-23 NOTE — Progress Notes (Signed)
 Note added

## 2025-01-14 ENCOUNTER — Ambulatory Visit: Admitting: Family Medicine

## 2025-01-19 ENCOUNTER — Ambulatory Visit (INDEPENDENT_AMBULATORY_CARE_PROVIDER_SITE_OTHER): Admitting: Family Medicine

## 2025-01-21 ENCOUNTER — Ambulatory Visit (INDEPENDENT_AMBULATORY_CARE_PROVIDER_SITE_OTHER): Admitting: Family Medicine

## 2025-07-14 ENCOUNTER — Encounter: Admitting: Family Medicine
